# Patient Record
Sex: Male | Born: 1980 | Race: White | Hispanic: No | Marital: Single | Smoking: Current every day smoker
Health system: Southern US, Community
[De-identification: ages and names within clinical notes are randomized; demographics above are authoritative.]

## PROBLEM LIST (undated history)

## (undated) DIAGNOSIS — G629 Polyneuropathy, unspecified: Secondary | ICD-10-CM

## (undated) DIAGNOSIS — I639 Cerebral infarction, unspecified: Secondary | ICD-10-CM

## (undated) DIAGNOSIS — Z789 Other specified health status: Secondary | ICD-10-CM

## (undated) DIAGNOSIS — I10 Essential (primary) hypertension: Secondary | ICD-10-CM

## (undated) DIAGNOSIS — N189 Chronic kidney disease, unspecified: Secondary | ICD-10-CM

## (undated) HISTORY — PX: NO PAST SURGERIES: SHX2092

---

## 2002-01-20 ENCOUNTER — Emergency Department (HOSPITAL_COMMUNITY): Admission: EM | Admit: 2002-01-20 | Discharge: 2002-01-20 | Payer: Self-pay

## 2002-01-24 ENCOUNTER — Emergency Department (HOSPITAL_COMMUNITY): Admission: EM | Admit: 2002-01-24 | Discharge: 2002-01-24 | Payer: Self-pay | Admitting: Emergency Medicine

## 2003-04-28 ENCOUNTER — Emergency Department (HOSPITAL_COMMUNITY): Admission: EM | Admit: 2003-04-28 | Discharge: 2003-04-28 | Payer: Self-pay | Admitting: General Practice

## 2006-01-09 ENCOUNTER — Inpatient Hospital Stay (HOSPITAL_COMMUNITY): Admission: EM | Admit: 2006-01-09 | Discharge: 2006-01-27 | Payer: Self-pay | Admitting: Emergency Medicine

## 2006-01-12 ENCOUNTER — Ambulatory Visit: Payer: Self-pay | Admitting: Cardiology

## 2006-01-13 ENCOUNTER — Ambulatory Visit: Payer: Self-pay | Admitting: Critical Care Medicine

## 2006-01-14 ENCOUNTER — Ambulatory Visit: Payer: Self-pay | Admitting: Cardiology

## 2006-01-14 ENCOUNTER — Encounter: Payer: Self-pay | Admitting: Cardiology

## 2006-01-16 ENCOUNTER — Encounter: Payer: Self-pay | Admitting: Vascular Surgery

## 2006-01-26 ENCOUNTER — Encounter: Payer: Self-pay | Admitting: *Deleted

## 2006-01-27 ENCOUNTER — Ambulatory Visit: Payer: Self-pay | Admitting: Physical Medicine & Rehabilitation

## 2007-03-20 ENCOUNTER — Emergency Department (HOSPITAL_COMMUNITY): Admission: EM | Admit: 2007-03-20 | Discharge: 2007-03-20 | Payer: Self-pay | Admitting: Emergency Medicine

## 2009-04-17 ENCOUNTER — Emergency Department (HOSPITAL_COMMUNITY): Admission: EM | Admit: 2009-04-17 | Discharge: 2009-04-17 | Payer: Self-pay | Admitting: Emergency Medicine

## 2009-04-23 ENCOUNTER — Emergency Department (HOSPITAL_COMMUNITY): Admission: EM | Admit: 2009-04-23 | Discharge: 2009-04-23 | Payer: Self-pay | Admitting: Emergency Medicine

## 2009-08-20 ENCOUNTER — Emergency Department (HOSPITAL_COMMUNITY): Admission: EM | Admit: 2009-08-20 | Discharge: 2009-08-20 | Payer: Self-pay | Admitting: Emergency Medicine

## 2010-06-16 LAB — RAPID STREP SCREEN (MED CTR MEBANE ONLY): Streptococcus, Group A Screen (Direct): NEGATIVE

## 2010-06-17 LAB — URINE MICROSCOPIC-ADD ON

## 2010-06-17 LAB — COMPREHENSIVE METABOLIC PANEL
ALT: 20 U/L (ref 0–53)
Albumin: 4.2 g/dL (ref 3.5–5.2)
Alkaline Phosphatase: 56 U/L (ref 39–117)
Glucose, Bld: 105 mg/dL — ABNORMAL HIGH (ref 70–99)
Potassium: 4 mEq/L (ref 3.5–5.1)
Sodium: 135 mEq/L (ref 135–145)
Total Protein: 7.4 g/dL (ref 6.0–8.3)

## 2010-06-17 LAB — URINALYSIS, ROUTINE W REFLEX MICROSCOPIC
Leukocytes, UA: NEGATIVE
Nitrite: NEGATIVE
Specific Gravity, Urine: >1.03 — ABNORMAL HIGH (ref 1.005–1.030)
Urobilinogen, UA: 0.2 mg/dL (ref 0.0–1.0)

## 2010-06-17 LAB — CBC
Hemoglobin: 16.7 g/dL (ref 13.0–17.0)
Platelets: 193 10*3/uL (ref 150–400)
RDW: 13.9 % (ref 11.5–15.5)

## 2010-06-17 LAB — DIFFERENTIAL
Basophils Relative: 0 % (ref 0–1)
Eosinophils Absolute: 0.9 10*3/uL — ABNORMAL HIGH (ref 0.0–0.7)
Eosinophils Relative: 9 % — ABNORMAL HIGH (ref 0–5)
Monocytes Absolute: 1.1 10*3/uL — ABNORMAL HIGH (ref 0.1–1.0)
Monocytes Relative: 10 % (ref 3–12)

## 2010-08-16 NOTE — Procedures (Signed)
[name] [name], [name] [name]            ACCOUNT NO.:  [id number]   MEDICAL RECORD NO.:  [id number]          PATIENT TYPE:  INP   LOCATION:  IC03                          FACILITY:  [facility]   PHYSICIAN:  Cristopher Estimable. Lattie Haw, MD, FACCDATE OF BIRTH:  [birth year]   DATE OF PROCEDURE:  01/12/2006  DATE OF DISCHARGE:                                  ECHOCARDIOGRAM   PROCEDURE:  Echocardiogram.   REFERRING PHYSICIAN:  Dr. Maryland Pink.   CLINICAL DATA:  A 30 year old with drug overdose and hypotension.   M-mode aorta 3.0, left atrium 3.9, septum 1.3, posterior wall 1.3, LV  diastole 5.1, LV systole 3.1.   1. Technically adequate echocardiographic study.  2. Normal left atrium, right atrium and right ventricle.  3. Normal aortic valve and aortic root.  4. Normal mitral and tricuspid valve; physiologic tricuspid regurgitation.  5. Normal pulmonic valve and proximal pulmonary artery.  6. Normal internal dimension of the left ventricle; mild LVH; normal      regional and global LV systolic function.  7. Normal IVC.  8. Minimal, probably physiologic, pericardial effusion.      Cristopher Estimable. Lattie Haw, MD, [facility]  Electronically Signed     RMR/MEDQ  D:  01/12/2006  T:  01/13/2006  Job:  [id number]
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
## 2010-08-16 NOTE — Discharge Summary (Signed)
NAMEPUJAN, FREELY            ACCOUNT NO.:  [id number]   MEDICAL RECORD NO.:  [id number]          PATIENT TYPE:  INP   LOCATION:  3027                         FACILITY:  [facility]   PHYSICIAN:  Edythe Lynn, M.D.       DATE OF BIRTH:  Oct 23, 1980   DATE OF ADMISSION:  01/13/2006  DATE OF DISCHARGE:  01/27/2006                                 DISCHARGE SUMMARY   DIAGNOSES:  1. Acute respiratory failure secondary to aspiration pneumonitis and adult      respiratory distress syndrome.  2. Methadone overdose.  3. Hypokalemia, resolved.  4. History of alcohol use.  5. Hypokalemia, resolved.  6. Mild rhabdomyolysis resolved.   DISCHARGE MEDICATIONS:  1. Prednisone 20 mg for 2 days and then 10 mg for 3 days then stop.  2. Folic acid 1 mg daily.  3. Vitamin B1 100 mg daily.   CONDITION ON DISCHARGE:  Mr. Knauth was discharged in fair condition at  the time of discharge.  He was set up with home health physical therapy and  occupational therapy.  The patient was also instructed to follow up with a  primary care physician in [place], [place].  Also, the patient's  family was instructed to 24-hour supervision on Mr. Wozny behavior.  There were also instructed to bring him immediately back to the hospital, in  case he displays any bizarre behavior.   PROCEDURES DURING THIS ADMISSION:  1. January 14, 2006, the patient underwent a portable chest x-ray with      findings of right central venous line at [facility] junction and endotracheal      tube 3.8 cm above the carina.  2. January 16, 2006, abdominal ultrasound with findings of gallbladder      sludge, no evidence of cholelithiasis or cholecystitis and normal-      appearing liver.  3. January 26, 2006, computer tomography scan of the head with findings of      no evidence for acute abnormality and no intracranial mass or lesion,      no skull fracture, some sinus disease.  4. January 24, 2006, portable chest x-ray with  findings of mild diffuse      interstitial edema.   CONSULTATIONS DURING THIS ADMISSION:  The patient was seen in consultation  by Dr. Halford Chessman from [place] and in fact, from January 14, 2006 until  January 25, 2006 the patient was under the care of the critical care  medicine service.   HISTORY AND PHYSICAL:  For admission history and physical, refer to the  dictated H&P done by Dr. Hillery Jacks on January 09, 2006.  Also, for the period  of hospitalization at [facility], refer to the previously dictated  discharge summary done by Dr. Maryland Pink January 13, 2006.  Briefly, Mr.  Kittles, a 30 year old man who was admitted on January 09 1006 at [facility], was transferred to [facility] January 13, 2006 for  worsening acute respiratory failure and development of ARDS.  He was  accepted by the Critical Care Service for ARDS treatment.   HOSPITAL COURSE:  1. From  October 17 until January 20, 2006, Mr. Greim was in the      Intensive Care Unit of [facility], where he received      specialized the [place] care.  He was on the ARDS      protocol for his respiratory failure, and he had gradual improvement in      his respiratory status.  On January 22, 2006, the patient was      extubated, and he maintained very good aeration and oxygenation off of      the respiratory support.  The patient was started on a prednisone      treatments, to help with the resolution of his ARDS.  At the time of      discharge, the patient is complaining of prednisone taper.  2. Methadone overdose.  Mr. Maxted received specialist treatment for      his overdose to [facility], and when he was awake and      extubated, he was seen by the Psychiatry Service at [facility] decided that Mr. Espinosa is safe      to be discharged home in the care of family, without any need for      further psychiatric treatment or  observation..  3. Delirium.  Post-extubation, Mr. Sangster displayed some symptoms of      delirium.  He was treated with doses of Seroquel and Klonopin which      were gradually tapered.  By the time of discharge, to Mr. Channer      delirium completely resolved.  There is still a constellation of a      possibility of a occult hypoxic brain injury.  For this reason, the      patient's family has been instructed to keep the patient under close      observation and to bring him back to the hospital, if they notice any      bizarre behavior.      Edythe Lynn, M.D.  Electronically Signed     SL/MEDQ  D:  01/28/2006  T:  01/28/2006  Job:  [id number]

## 2010-10-03 ENCOUNTER — Emergency Department (HOSPITAL_COMMUNITY)
Admission: EM | Admit: 2010-10-03 | Discharge: 2010-10-03 | Disposition: A | Discharge status: Home / Self Care | Payer: Self-pay | Attending: Emergency Medicine | Admitting: Emergency Medicine

## 2010-10-03 DIAGNOSIS — L509 Urticaria, unspecified: Secondary | ICD-10-CM | POA: Insufficient documentation

## 2010-10-03 DIAGNOSIS — F17200 Nicotine dependence, unspecified, uncomplicated: Secondary | ICD-10-CM | POA: Insufficient documentation

## 2016-08-20 ENCOUNTER — Encounter (HOSPITAL_COMMUNITY): Payer: Self-pay

## 2016-08-20 ENCOUNTER — Emergency Department (HOSPITAL_COMMUNITY)
Admission: EM | Admit: 2016-08-20 | Discharge: 2016-08-20 | Disposition: A | Discharge status: Home / Self Care | Payer: Self-pay | Attending: Emergency Medicine | Admitting: Emergency Medicine

## 2016-08-20 ENCOUNTER — Emergency Department (HOSPITAL_COMMUNITY): Payer: Self-pay

## 2016-08-20 DIAGNOSIS — F17200 Nicotine dependence, unspecified, uncomplicated: Secondary | ICD-10-CM | POA: Insufficient documentation

## 2016-08-20 DIAGNOSIS — R51 Headache: Secondary | ICD-10-CM | POA: Insufficient documentation

## 2016-08-20 DIAGNOSIS — R112 Nausea with vomiting, unspecified: Secondary | ICD-10-CM

## 2016-08-20 DIAGNOSIS — R079 Chest pain, unspecified: Secondary | ICD-10-CM

## 2016-08-20 DIAGNOSIS — R197 Diarrhea, unspecified: Secondary | ICD-10-CM | POA: Insufficient documentation

## 2016-08-20 LAB — CBC WITH DIFFERENTIAL/PLATELET
Basophils Absolute: 0 10*3/uL (ref 0.0–0.1)
Basophils Relative: 1 %
Eosinophils Absolute: 0.3 10*3/uL (ref 0.0–0.7)
Eosinophils Relative: 4 %
HEMATOCRIT: 46.3 % (ref 39.0–52.0)
Hemoglobin: 16.8 g/dL (ref 13.0–17.0)
LYMPHS ABS: 3 10*3/uL (ref 0.7–4.0)
LYMPHS PCT: 38 %
MCH: 32.7 pg (ref 26.0–34.0)
MCHC: 36.3 g/dL — ABNORMAL HIGH (ref 30.0–36.0)
MCV: 90.3 fL (ref 78.0–100.0)
MONO ABS: 0.7 10*3/uL (ref 0.1–1.0)
Monocytes Relative: 8 %
NEUTROS ABS: 4 10*3/uL (ref 1.7–7.7)
Neutrophils Relative %: 49 %
Platelets: 180 10*3/uL (ref 150–400)
RBC: 5.13 MIL/uL (ref 4.22–5.81)
RDW: 13.8 % (ref 11.5–15.5)
WBC: 8 10*3/uL (ref 4.0–10.5)

## 2016-08-20 LAB — COMPREHENSIVE METABOLIC PANEL
ALT: 21 U/L (ref 17–63)
ANION GAP: 8 (ref 5–15)
AST: 19 U/L (ref 15–41)
Albumin: 3.9 g/dL (ref 3.5–5.0)
Alkaline Phosphatase: 41 U/L (ref 38–126)
BILIRUBIN TOTAL: 1.1 mg/dL (ref 0.3–1.2)
BUN: 17 mg/dL (ref 6–20)
CHLORIDE: 109 mmol/L (ref 101–111)
CO2: 24 mmol/L (ref 22–32)
Calcium: 8.8 mg/dL — ABNORMAL LOW (ref 8.9–10.3)
Creatinine, Ser: 1.01 mg/dL (ref 0.61–1.24)
GFR calc non Af Amer: >60 mL/min (ref >60)
Glucose, Bld: 101 mg/dL — ABNORMAL HIGH (ref 65–99)
POTASSIUM: 3.3 mmol/L — AB (ref 3.5–5.1)
Sodium: 141 mmol/L (ref 135–145)
TOTAL PROTEIN: 7 g/dL (ref 6.5–8.1)

## 2016-08-20 LAB — TROPONIN I: Troponin I: <0.03 ng/mL (ref <0.03)

## 2016-08-20 LAB — LIPASE, BLOOD: Lipase: 31 U/L (ref 11–51)

## 2016-08-20 MED ORDER — KETOROLAC TROMETHAMINE 30 MG/ML IJ SOLN
15.0000 mg | Freq: Once | INTRAMUSCULAR | Status: AC
  Administered 2016-08-20: 15 mg via INTRAVENOUS
  Filled 2016-08-20: qty 1

## 2016-08-20 MED ORDER — SODIUM CHLORIDE 0.9 % IV BOLUS (SEPSIS)
1000.0000 mL | Freq: Once | INTRAVENOUS | Status: AC
  Administered 2016-08-20: 1000 mL via INTRAVENOUS

## 2016-08-20 MED ORDER — ONDANSETRON HCL 4 MG/2ML IJ SOLN
4.0000 mg | Freq: Once | INTRAMUSCULAR | Status: AC
  Administered 2016-08-20: 4 mg via INTRAVENOUS
  Filled 2016-08-20: qty 2

## 2016-08-20 MED ORDER — FAMOTIDINE 20 MG PO TABS: 20.0000 mg | ORAL_TABLET | Freq: Two times a day (BID) | ORAL | 0 refills | Status: DC

## 2016-08-20 MED ORDER — PROMETHAZINE HCL 25 MG PO TABS: 25.0000 mg | ORAL_TABLET | Freq: Four times a day (QID) | ORAL | 1 refills | Status: DC | PRN

## 2016-08-20 NOTE — ED Notes (Signed)
Pt ambulatory to waiting room. Pt verbalized understanding of discharge instructions.   

## 2016-08-20 NOTE — ED Triage Notes (Signed)
Pt reports centralized chest pain, with radiation to epigastric/luq, bilateral arms. Report migraine and vomiting this morning.
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
## 2016-08-20 NOTE — Discharge Instructions (Addendum)
Take the Pepcid as directed for the next 2 weeks. Take the Phenergan for the next few days as needed for nausea and vomiting. Work note provided. Today's workup without any significant findings. Return for any new or worse symptoms.

## 2017-04-16 ENCOUNTER — Emergency Department (HOSPITAL_COMMUNITY)
Admission: EM | Admit: 2017-04-16 | Discharge: 2017-04-16 | Disposition: A | Discharge status: Other Acute Inpatient Hospital | Payer: Self-pay | Attending: Emergency Medicine | Admitting: Emergency Medicine

## 2017-04-16 ENCOUNTER — Encounter (HOSPITAL_COMMUNITY): Payer: Self-pay | Admitting: Emergency Medicine

## 2017-04-16 DIAGNOSIS — T23032A Burn of unspecified degree of multiple left fingers (nail), not including thumb, initial encounter: Secondary | ICD-10-CM | POA: Insufficient documentation

## 2017-04-16 DIAGNOSIS — X04XXXA Exposure to ignition of highly flammable material, initial encounter: Secondary | ICD-10-CM | POA: Insufficient documentation

## 2017-04-16 DIAGNOSIS — Y939 Activity, unspecified: Secondary | ICD-10-CM | POA: Insufficient documentation

## 2017-04-16 DIAGNOSIS — T310 Burns involving less than 10% of body surface: Secondary | ICD-10-CM | POA: Insufficient documentation

## 2017-04-16 DIAGNOSIS — Y998 Other external cause status: Secondary | ICD-10-CM | POA: Insufficient documentation

## 2017-04-16 DIAGNOSIS — Y929 Unspecified place or not applicable: Secondary | ICD-10-CM | POA: Insufficient documentation

## 2017-04-16 DIAGNOSIS — F17200 Nicotine dependence, unspecified, uncomplicated: Secondary | ICD-10-CM | POA: Insufficient documentation

## 2017-04-16 LAB — CBC WITH DIFFERENTIAL/PLATELET
BASOS ABS: 0 10*3/uL (ref 0.0–0.1)
Basophils Relative: 0 %
Eosinophils Absolute: 0.4 10*3/uL (ref 0.0–0.7)
Eosinophils Relative: 3 %
HEMATOCRIT: 47 % (ref 39.0–52.0)
Hemoglobin: 16.4 g/dL (ref 13.0–17.0)
LYMPHS ABS: 3.8 10*3/uL (ref 0.7–4.0)
Lymphocytes Relative: 26 %
MCH: 31.7 pg (ref 26.0–34.0)
MCHC: 34.9 g/dL (ref 30.0–36.0)
MCV: 90.7 fL (ref 78.0–100.0)
MONOS PCT: 9 %
Monocytes Absolute: 1.3 10*3/uL — ABNORMAL HIGH (ref 0.1–1.0)
NEUTROS PCT: 62 %
Neutro Abs: 9.2 10*3/uL — ABNORMAL HIGH (ref 1.7–7.7)
PLATELETS: 241 10*3/uL (ref 150–400)
RBC: 5.18 MIL/uL (ref 4.22–5.81)
RDW: 14.1 % (ref 11.5–15.5)
WBC: 14.7 10*3/uL — AB (ref 4.0–10.5)

## 2017-04-16 LAB — COMPREHENSIVE METABOLIC PANEL
ALT: 27 U/L (ref 17–63)
AST: 35 U/L (ref 15–41)
Albumin: 4.4 g/dL (ref 3.5–5.0)
Alkaline Phosphatase: 45 U/L (ref 38–126)
Anion gap: 13 (ref 5–15)
BUN: 14 mg/dL (ref 6–20)
CO2: 23 mmol/L (ref 22–32)
Calcium: 9.1 mg/dL (ref 8.9–10.3)
Chloride: 104 mmol/L (ref 101–111)
Creatinine, Ser: 1.37 mg/dL — ABNORMAL HIGH (ref 0.61–1.24)
Glucose, Bld: 98 mg/dL (ref 65–99)
POTASSIUM: 3.9 mmol/L (ref 3.5–5.1)
Sodium: 140 mmol/L (ref 135–145)
Total Bilirubin: 1.2 mg/dL (ref 0.3–1.2)
Total Protein: 7.7 g/dL (ref 6.5–8.1)

## 2017-04-16 MED ORDER — HYDROMORPHONE HCL 1 MG/ML IJ SOLN
1.0000 mg | INTRAMUSCULAR | Status: AC | PRN
  Administered 2017-04-16 (×4): 1 mg via INTRAVENOUS
  Filled 2017-04-16 (×4): qty 1

## 2017-04-16 MED ORDER — LACTATED RINGERS IV SOLN: INTRAVENOUS | Status: DC

## 2017-04-16 MED ORDER — LACTATED RINGERS IV BOLUS (SEPSIS)
500.0000 mL | Freq: Once | INTRAVENOUS | Status: AC
  Administered 2017-04-16: 500 mL via INTRAVENOUS

## 2017-04-16 MED ORDER — ONDANSETRON HCL 4 MG/2ML IJ SOLN
4.0000 mg | Freq: Once | INTRAMUSCULAR | Status: AC
  Administered 2017-04-16: 4 mg via INTRAVENOUS
  Filled 2017-04-16: qty 2

## 2017-04-16 MED ORDER — HYDROMORPHONE HCL 1 MG/ML IJ SOLN
1.0000 mg | INTRAMUSCULAR | Status: DC | PRN
  Administered 2017-04-16 (×3): 1 mg via INTRAVENOUS
  Filled 2017-04-16 (×3): qty 1
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
## 2017-04-16 NOTE — ED Notes (Signed)
Report given to [facility] transport time.

## 2017-04-16 NOTE — ED Notes (Signed)
[place] at [facility] through [place] per Dr. Eulis Foster
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
## 2017-12-27 ENCOUNTER — Emergency Department (HOSPITAL_COMMUNITY)
Admission: EM | Admit: 2017-12-27 | Discharge: 2017-12-27 | Disposition: A | Discharge status: Home / Self Care | Attending: Emergency Medicine | Admitting: Emergency Medicine

## 2017-12-27 ENCOUNTER — Encounter (HOSPITAL_COMMUNITY): Payer: Self-pay | Admitting: Emergency Medicine

## 2017-12-27 DIAGNOSIS — F17210 Nicotine dependence, cigarettes, uncomplicated: Secondary | ICD-10-CM | POA: Diagnosis not present

## 2017-12-27 DIAGNOSIS — M7989 Other specified soft tissue disorders: Secondary | ICD-10-CM | POA: Diagnosis not present

## 2017-12-27 DIAGNOSIS — Z79899 Other long term (current) drug therapy: Secondary | ICD-10-CM | POA: Insufficient documentation

## 2017-12-27 MED ORDER — CEPHALEXIN 500 MG PO CAPS: 1000.0000 mg | ORAL_CAPSULE | Freq: Two times a day (BID) | ORAL | 0 refills | Status: DC

## 2017-12-27 MED ORDER — CEPHALEXIN 500 MG PO CAPS
1000.0000 mg | ORAL_CAPSULE | Freq: Once | ORAL | Status: AC
  Administered 2017-12-27: 1000 mg via ORAL
  Filled 2017-12-27: qty 2
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
## 2018-04-06 ENCOUNTER — Emergency Department (HOSPITAL_COMMUNITY)

## 2018-04-06 ENCOUNTER — Other Ambulatory Visit: Payer: Self-pay

## 2018-04-06 ENCOUNTER — Encounter (HOSPITAL_COMMUNITY): Payer: Self-pay | Admitting: Emergency Medicine

## 2018-04-06 ENCOUNTER — Emergency Department (HOSPITAL_COMMUNITY)
Admission: EM | Admit: 2018-04-06 | Discharge: 2018-04-06 | Disposition: A | Discharge status: Home / Self Care | Attending: Emergency Medicine | Admitting: Emergency Medicine

## 2018-04-06 DIAGNOSIS — Y9389 Activity, other specified: Secondary | ICD-10-CM | POA: Diagnosis not present

## 2018-04-06 DIAGNOSIS — M549 Dorsalgia, unspecified: Secondary | ICD-10-CM | POA: Insufficient documentation

## 2018-04-06 DIAGNOSIS — M25511 Pain in right shoulder: Secondary | ICD-10-CM | POA: Insufficient documentation

## 2018-04-06 DIAGNOSIS — S0993XA Unspecified injury of face, initial encounter: Secondary | ICD-10-CM | POA: Insufficient documentation

## 2018-04-06 DIAGNOSIS — Y999 Unspecified external cause status: Secondary | ICD-10-CM | POA: Insufficient documentation

## 2018-04-06 DIAGNOSIS — F17210 Nicotine dependence, cigarettes, uncomplicated: Secondary | ICD-10-CM | POA: Diagnosis not present

## 2018-04-06 DIAGNOSIS — W19XXXA Unspecified fall, initial encounter: Secondary | ICD-10-CM

## 2018-04-06 DIAGNOSIS — S0990XA Unspecified injury of head, initial encounter: Secondary | ICD-10-CM | POA: Diagnosis present

## 2018-04-06 DIAGNOSIS — R0789 Other chest pain: Secondary | ICD-10-CM | POA: Insufficient documentation

## 2018-04-06 DIAGNOSIS — Y92149 Unspecified place in prison as the place of occurrence of the external cause: Secondary | ICD-10-CM | POA: Insufficient documentation

## 2018-04-06 DIAGNOSIS — R0781 Pleurodynia: Secondary | ICD-10-CM

## 2018-04-06 MED ORDER — OXYCODONE HCL 5 MG PO TABS
5.0000 mg | ORAL_TABLET | Freq: Once | ORAL | Status: AC
  Administered 2018-04-06: 5 mg via ORAL
  Filled 2018-04-06: qty 1

## 2018-04-06 MED ORDER — ACETAMINOPHEN 500 MG PO TABS
1000.0000 mg | ORAL_TABLET | Freq: Once | ORAL | Status: AC
  Administered 2018-04-06: 1000 mg via ORAL
  Filled 2018-04-06: qty 2

## 2018-04-06 MED ORDER — OXYCODONE HCL 5 MG PO TABS: 5.0000 mg | ORAL_TABLET | ORAL | 0 refills | Status: DC | PRN
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
## 2018-04-06 NOTE — ED Notes (Signed)
Pt would like something else for pain before discharge. RN notified.
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
## 2018-04-06 NOTE — ED Notes (Signed)
Pt asking for medication before leaving since he can not have prescription in the jail. EDP will order.
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
## 2021-08-13 ENCOUNTER — Encounter (HOSPITAL_COMMUNITY): Payer: Self-pay | Admitting: Emergency Medicine

## 2021-08-13 ENCOUNTER — Emergency Department (HOSPITAL_COMMUNITY)
Admission: EM | Admit: 2021-08-13 | Discharge: 2021-08-13 | Disposition: A | Discharge status: Home / Self Care | Attending: Emergency Medicine | Admitting: Emergency Medicine

## 2021-08-13 ENCOUNTER — Other Ambulatory Visit: Payer: Self-pay

## 2021-08-13 DIAGNOSIS — L0291 Cutaneous abscess, unspecified: Secondary | ICD-10-CM

## 2021-08-13 DIAGNOSIS — L0231 Cutaneous abscess of buttock: Secondary | ICD-10-CM | POA: Insufficient documentation

## 2021-08-13 MED ORDER — DOXYCYCLINE HYCLATE 100 MG PO CAPS: 100.0000 mg | ORAL_CAPSULE | Freq: Two times a day (BID) | ORAL | 0 refills | Status: DC

## 2021-08-13 MED ORDER — POVIDONE-IODINE 10 % EX SOLN
CUTANEOUS | Status: AC
  Filled 2021-08-13: qty 14.8

## 2021-08-13 MED ORDER — OXYCODONE-ACETAMINOPHEN 5-325 MG PO TABS: 1.0000 | ORAL_TABLET | ORAL | 0 refills | Status: DC | PRN

## 2021-08-13 MED ORDER — HYDROMORPHONE HCL 1 MG/ML IJ SOLN
2.0000 mg | Freq: Once | INTRAMUSCULAR | Status: AC
  Administered 2021-08-13: 2 mg via INTRAMUSCULAR
  Filled 2021-08-13: qty 2

## 2021-08-13 MED ORDER — LIDOCAINE HCL (PF) 1 % IJ SOLN
30.0000 mL | Freq: Once | INTRAMUSCULAR | Status: AC
  Administered 2021-08-13: 30 mL
  Filled 2021-08-13: qty 30

## 2021-08-13 MED ORDER — CEPHALEXIN 500 MG PO CAPS: 500.0000 mg | ORAL_CAPSULE | Freq: Four times a day (QID) | ORAL | 0 refills | Status: DC

## 2021-08-13 NOTE — ED Provider Notes (Signed)
?Edie ?Provider Note ? ? ?CSN: [id number] ?Arrival date & time: 08/13/21  0539 ? ?  ? ?History ? ?Chief Complaint  ?Patient presents with  ? Abscess  ? ? ?Josey Forcier is a 41 y.o. male. ? ?HPI ?Patient presenting for buttocks pain, with a swollen area, no clear etiology.  He works as an Clinical biochemist.  It has been bothering him for several days and he has been manipulating it and using a warm compress on it without relief.  No prior similar problem at the same site.  He has pain in the left groin.  He has noticed this in the last couple of days. ?  ? ?Home Medications ?Prior to Admission medications   ?Medication Sig Start Date End Date Taking? Authorizing Provider  ?cephALEXin (KEFLEX) 500 MG capsule Take 1 capsule (500 mg total) by mouth 4 (four) times daily. 08/13/21  Yes Daleen Bo, MD  ?doxycycline (VIBRAMYCIN) 100 MG capsule Take 1 capsule (100 mg total) by mouth 2 (two) times daily. One po bid x 7 days 08/13/21  Yes Daleen Bo, MD  ?oxyCODONE-acetaminophen (PERCOCET) 5-325 MG tablet Take 1 tablet by mouth every 4 (four) hours as needed for severe pain or moderate pain. 08/13/21  Yes Daleen Bo, MD  ?oxyCODONE (ROXICODONE) 5 MG immediate release tablet Take 1 tablet (5 mg total) by mouth every 4 (four) hours as needed for severe pain. ?Patient not taking: Reported on 08/13/2021 04/06/18   Maudie Flakes, MD  ?   ? ?Allergies    ?Patient has no known allergies.   ? ?Review of Systems   ?Review of Systems ? ?Physical Exam ?Updated Vital Signs ?BP (!) 147/82 (BP Location: Left Arm)   Pulse 93   Temp 97.8 ?F (36.6 ?C) (Oral)   Resp 18   Ht 5\' 7"  (1.702 m)   Wt 93 kg   SpO2 98%   BMI 32.11 kg/m?  ?Physical Exam ?Vitals and nursing note reviewed.  ?Constitutional:   ?   Appearance: He is well-developed. He is not ill-appearing.  ?HENT:  ?   Head: Normocephalic and atraumatic.  ?   Right Ear: External ear normal.  ?   Left Ear: External ear normal.  ?Eyes:  ?    Conjunctiva/sclera: Conjunctivae normal.  ?   Pupils: Pupils are equal, round, and reactive to light.  ?Neck:  ?   Trachea: Phonation normal.  ?Cardiovascular:  ?   Rate and Rhythm: Normal rate.  ?Pulmonary:  ?   Effort: Pulmonary effort is normal.  ?Abdominal:  ?   General: There is no distension.  ?   Tenderness: There is no abdominal tenderness.  ?   Comments: No significant  tenderness, mass, or adenopathy in the left groin  ?Musculoskeletal:     ?   General: Normal range of motion.  ?   Cervical back: Normal range of motion and neck supple.  ?Skin: ?   General: Skin is warm and dry.  ?   Comments: Cutaneous abscess, left lower buttocks, area of induration approximately 5 cm in diameter.  Wound is pointing centrally with pustular eruption.  The wound is mildly fluctuant.  ?Neurological:  ?   Mental Status: He is alert and oriented to person, place, and time.  ?   Cranial Nerves: No cranial nerve deficit.  ?   Sensory: No sensory deficit.  ?   Motor: No abnormal muscle tone.  ?   Coordination: Coordination normal.  ?Psychiatric:     ?  Mood and Affect: Mood normal.     ?   Behavior: Behavior normal.     ?   Thought Content: Thought content normal.     ?   Judgment: Judgment normal.  ? ? ?ED Results / Procedures / Treatments   ?Labs ?(all labs ordered are listed, but only abnormal results are displayed) ?Labs Reviewed - No data to display ? ?EKG ?None ? ?Radiology ?No results found. ? ?Procedures ?Marland Kitchen.Incision and Drainage ? ?Date/Time: 08/13/2021 10:51 AM ?Performed by: Daleen Bo, MD ?Authorized by: Daleen Bo, MD  ? ?Consent:  ?  Consent obtained:  Verbal ?  Risks discussed:  Bleeding, incomplete drainage and pain ?Universal protocol:  ?  Patient identity confirmed:  Verbally with patient ?Location:  ?  Type:  Abscess ?  Size:  5 cm ?  Location: Left buttock. ?Pre-procedure details:  ?  Skin preparation:  Povidone-iodine ?Sedation:  ?  Sedation type:  None ?Anesthesia:  ?  Anesthesia method:  Local  infiltration ?  Local anesthetic:  Lidocaine 1% w/o epi ?Procedure type:  ?  Complexity:  Simple ?Procedure details:  ?  Ultrasound guidance: no   ?  Needle aspiration: yes   ?  Needle size:  18 G ?  Incision types:  Single straight ?  Incision depth:  Subcutaneous ?  Wound management:  Irrigated with saline ?  Drainage:  Purulent (Mild) ?  Wound treatment:  Drain placed ?  Packing materials:  1/4 in iodoform gauze ?  Amount 1/4" iodoform:  3 ?Post-procedure details:  ?  Procedure completion:  Tolerated well, no immediate complications  ? ? ?Medications Ordered in ED ?Medications  ?HYDROmorphone (DILAUDID) injection 2 mg (2 mg Intramuscular Given 08/13/21 0740)  ?lidocaine (PF) (XYLOCAINE) 1 % injection 30 mL (30 mLs Infiltration Given by Other 08/13/21 1033)  ?povidone-iodine (BETADINE) 10 % external solution (  Given 08/13/21 1034)  ? ? ?ED Course/ Medical Decision Making/ A&P ?  ?                        ?Medical Decision Making ?Patient presenting with cutaneous abscess requiring drainage.  No systemic symptoms.  Mild left groin pain is likely secondary to the inflammatory process of the left buttock. ? ?Problems Addressed: ?Abscess: complicated acute illness or injury ?   Details: Worsening abscess, not responding to home treatment with warm compresses and manipulation. ? ?Amount and/or Complexity of Data Reviewed ?Independent Historian:  ?   Details: He is a cogent historian ? ?Risk ?Prescription drug management. ?Decision regarding hospitalization. ?Risk Details: Patient presenting for evaluation of abscess of the left buttocks that has not improved with home treatment.  The abscess was pointing and required drainage.  I&D after treatment with pain medicine, by me.  I was able to remove some purulent material from the wound, and placed packing.  Patient instructed on wound care.  He has significant surrounding signs of infection that appear greater than the size of the pocket, found on I&D.  Therefore he will be  started on oral antibiotic with Keflex and doxycycline.  He is instructed on warm soaking 3 times daily.  He will be given chronic pain medicine.  He is given precautions against driving or working when taking the narcotic.  He is instructed to remove the packing after several days of soaking.  He is instructed to return here if needed for problems.  He does not require hospitalization or surgical intervention at this time. ? ? ? ? ? ? ? ? ? ? ?  Final Clinical Impression(s) / ED Diagnoses ?Final diagnoses:  ?Abscess  ? ? ?Rx / DC Orders ?ED Discharge Orders   ? ?      Ordered  ?  cephALEXin (KEFLEX) 500 MG capsule  4 times daily       ? 08/13/21 1051  ?  doxycycline (VIBRAMYCIN) 100 MG capsule  2 times daily       ? 08/13/21 1051  ?  oxyCODONE-acetaminophen (PERCOCET) 5-325 MG tablet  Every 4 hours PRN       ? 08/13/21 1051  ? ?  ?  ? ?  ? ? ?  ?Daleen Bo, MD ?08/13/21 1821 ? ?

## 2021-08-13 NOTE — ED Triage Notes (Deleted)
Pt c/o abd pain from where feeding tube used to be for a few days. Pt denies any n/v/d. ?

## 2021-08-13 NOTE — Discharge Instructions (Signed)
Soak in a warm tub 3-4 times a day for 30 to 40 minutes.  Try to leave the packing in the wound for several days then remove it by pulling directly out.  Start the antibiotic prescription as soon as possible.  Do not drive or work when taking the narcotic pain reliever.  Return here if not better in a few days or having other problems or concerns. ?

## 2021-08-13 NOTE — ED Triage Notes (Addendum)
Pt c/o abscess to the left buttocks since Friday. Pt states it is as big as softball and has pockets around the area.   ?

## 2021-11-11 ENCOUNTER — Encounter (HOSPITAL_COMMUNITY)
Admission: EM | Disposition: A | Discharge status: Home / Self Care | Payer: Self-pay | Source: Home / Self Care | Attending: Internal Medicine

## 2021-11-11 ENCOUNTER — Other Ambulatory Visit: Payer: Self-pay

## 2021-11-11 ENCOUNTER — Observation Stay (HOSPITAL_COMMUNITY): Payer: Self-pay | Admitting: Certified Registered Nurse Anesthetist

## 2021-11-11 ENCOUNTER — Encounter (HOSPITAL_COMMUNITY): Payer: Self-pay | Admitting: *Deleted

## 2021-11-11 ENCOUNTER — Observation Stay (HOSPITAL_BASED_OUTPATIENT_CLINIC_OR_DEPARTMENT_OTHER): Payer: Self-pay | Admitting: Certified Registered Nurse Anesthetist

## 2021-11-11 ENCOUNTER — Emergency Department (HOSPITAL_COMMUNITY): Payer: Self-pay

## 2021-11-11 ENCOUNTER — Inpatient Hospital Stay (HOSPITAL_COMMUNITY)
Admission: EM | Admit: 2021-11-11 | Discharge: 2021-11-12 | DRG: 513 | Disposition: A | Discharge status: Home / Self Care | Payer: Self-pay | Attending: Family Medicine | Admitting: Family Medicine

## 2021-11-11 DIAGNOSIS — M659 Synovitis and tenosynovitis, unspecified: Secondary | ICD-10-CM | POA: Diagnosis present

## 2021-11-11 DIAGNOSIS — F17210 Nicotine dependence, cigarettes, uncomplicated: Secondary | ICD-10-CM | POA: Diagnosis present

## 2021-11-11 DIAGNOSIS — L02511 Cutaneous abscess of right hand: Secondary | ICD-10-CM | POA: Diagnosis present

## 2021-11-11 DIAGNOSIS — L02519 Cutaneous abscess of unspecified hand: Secondary | ICD-10-CM

## 2021-11-11 DIAGNOSIS — S61001A Unspecified open wound of right thumb without damage to nail, initial encounter: Secondary | ICD-10-CM

## 2021-11-11 DIAGNOSIS — L02512 Cutaneous abscess of left hand: Secondary | ICD-10-CM

## 2021-11-11 DIAGNOSIS — M009 Pyogenic arthritis, unspecified: Secondary | ICD-10-CM

## 2021-11-11 DIAGNOSIS — M65842 Other synovitis and tenosynovitis, left hand: Secondary | ICD-10-CM

## 2021-11-11 DIAGNOSIS — Z8249 Family history of ischemic heart disease and other diseases of the circulatory system: Secondary | ICD-10-CM

## 2021-11-11 DIAGNOSIS — L03011 Cellulitis of right finger: Secondary | ICD-10-CM | POA: Diagnosis present

## 2021-11-11 DIAGNOSIS — N179 Acute kidney failure, unspecified: Secondary | ICD-10-CM

## 2021-11-11 HISTORY — PX: I & D EXTREMITY: SHX5045

## 2021-11-11 HISTORY — DX: Unspecified open wound of right thumb without damage to nail, initial encounter: S61.001A

## 2021-11-11 HISTORY — DX: Other specified health status: Z78.9

## 2021-11-11 SURGERY — IRRIGATION AND DEBRIDEMENT EXTREMITY
Anesthesia: General | Laterality: Right

## 2021-11-11 SURGERY — IRRIGATION AND DEBRIDEMENT EXTREMITY
Anesthesia: Choice | Laterality: Right

## 2021-11-11 MED ORDER — METOCLOPRAMIDE HCL 5 MG/ML IJ SOLN: 5.0000 mg | Freq: Three times a day (TID) | INTRAMUSCULAR | Status: DC | PRN

## 2021-11-11 MED ORDER — LIDOCAINE HCL (CARDIAC) PF 100 MG/5ML IV SOSY
PREFILLED_SYRINGE | INTRAVENOUS | Status: DC | PRN
  Administered 2021-11-11: 60 mg via INTRAVENOUS

## 2021-11-11 MED ORDER — OXYCODONE HCL 5 MG PO TABS
5.0000 mg | ORAL_TABLET | Freq: Once | ORAL | Status: AC | PRN
  Administered 2021-11-11: 5 mg via ORAL

## 2021-11-11 MED ORDER — 0.9 % SODIUM CHLORIDE (POUR BTL) OPTIME
TOPICAL | Status: DC | PRN
  Administered 2021-11-11: 1000 mL
  Administered 2021-11-11: 3000 mL

## 2021-11-11 MED ORDER — ONDANSETRON HCL 4 MG PO TABS: 4.0000 mg | ORAL_TABLET | Freq: Four times a day (QID) | ORAL | Status: DC | PRN

## 2021-11-11 MED ORDER — ACETAMINOPHEN 10 MG/ML IV SOLN
INTRAVENOUS | Status: AC
  Filled 2021-11-11: qty 100

## 2021-11-11 MED ORDER — ONDANSETRON HCL 4 MG/2ML IJ SOLN: 4.0000 mg | Freq: Four times a day (QID) | INTRAMUSCULAR | Status: DC | PRN

## 2021-11-11 MED ORDER — ACETAMINOPHEN 650 MG RE SUPP: 650.0000 mg | Freq: Four times a day (QID) | RECTAL | Status: DC | PRN

## 2021-11-11 MED ORDER — DEXAMETHASONE SODIUM PHOSPHATE 10 MG/ML IJ SOLN
INTRAMUSCULAR | Status: DC | PRN
  Administered 2021-11-11: 8 mg via INTRAVENOUS

## 2021-11-11 MED ORDER — CHLORHEXIDINE GLUCONATE 4 % EX LIQD: 60.0000 mL | Freq: Once | CUTANEOUS | Status: DC

## 2021-11-11 MED ORDER — ONDANSETRON HCL 4 MG/2ML IJ SOLN
INTRAMUSCULAR | Status: DC | PRN
  Administered 2021-11-11: 4 mg via INTRAVENOUS

## 2021-11-11 MED ORDER — ACETAMINOPHEN 10 MG/ML IV SOLN
INTRAVENOUS | Status: DC | PRN
  Administered 2021-11-11: 1000 mg via INTRAVENOUS

## 2021-11-11 MED ORDER — HYDROMORPHONE HCL 1 MG/ML IJ SOLN
1.0000 mg | Freq: Once | INTRAMUSCULAR | Status: AC
  Administered 2021-11-11: 1 mg via INTRAVENOUS
  Filled 2021-11-11: qty 1

## 2021-11-11 MED ORDER — HYDROMORPHONE HCL 1 MG/ML IJ SOLN
1.0000 mg | INTRAMUSCULAR | Status: DC | PRN
  Administered 2021-11-11 – 2021-11-12 (×4): 1 mg via INTRAVENOUS
  Filled 2021-11-11 (×6): qty 1

## 2021-11-11 MED ORDER — KETOROLAC TROMETHAMINE 30 MG/ML IJ SOLN
30.0000 mg | Freq: Once | INTRAMUSCULAR | Status: AC
  Administered 2021-11-11: 30 mg via INTRAVENOUS
  Filled 2021-11-11: qty 1

## 2021-11-11 MED ORDER — MIDAZOLAM HCL 2 MG/2ML IJ SOLN
INTRAMUSCULAR | Status: DC | PRN
  Administered 2021-11-11: 2 mg via INTRAVENOUS

## 2021-11-11 MED ORDER — FENTANYL CITRATE PF 50 MCG/ML IJ SOSY
PREFILLED_SYRINGE | INTRAMUSCULAR | Status: AC
  Filled 2021-11-11: qty 1

## 2021-11-11 MED ORDER — ACETAMINOPHEN 10 MG/ML IV SOLN: 1000.0000 mg | Freq: Once | INTRAVENOUS | Status: DC | PRN

## 2021-11-11 MED ORDER — SODIUM CHLORIDE 0.9 % IV SOLN
2.0000 g | Freq: Three times a day (TID) | INTRAVENOUS | Status: DC
  Administered 2021-11-11 – 2021-11-12 (×3): 2 g via INTRAVENOUS
  Filled 2021-11-11 (×3): qty 12.5

## 2021-11-11 MED ORDER — VANCOMYCIN HCL 750 MG/150ML IV SOLN
750.0000 mg | Freq: Two times a day (BID) | INTRAVENOUS | Status: DC
  Administered 2021-11-11: 750 mg via INTRAVENOUS
  Filled 2021-11-11: qty 150

## 2021-11-11 MED ORDER — FENTANYL CITRATE PF 50 MCG/ML IJ SOSY
25.0000 ug | PREFILLED_SYRINGE | INTRAMUSCULAR | Status: DC | PRN
  Administered 2021-11-11 (×2): 25 ug via INTRAVENOUS
  Administered 2021-11-11: 50 ug via INTRAVENOUS
  Administered 2021-11-11 (×2): 25 ug via INTRAVENOUS

## 2021-11-11 MED ORDER — FENTANYL CITRATE (PF) 100 MCG/2ML IJ SOLN
INTRAMUSCULAR | Status: AC
  Filled 2021-11-11: qty 2

## 2021-11-11 MED ORDER — OXYCODONE HCL 5 MG PO TABS
ORAL_TABLET | ORAL | Status: AC
  Filled 2021-11-11: qty 1

## 2021-11-11 MED ORDER — LACTATED RINGERS IV SOLN: INTRAVENOUS | Status: DC | PRN

## 2021-11-11 MED ORDER — DOCUSATE SODIUM 100 MG PO CAPS
100.0000 mg | ORAL_CAPSULE | Freq: Two times a day (BID) | ORAL | Status: DC
  Administered 2021-11-11 – 2021-11-12 (×2): 100 mg via ORAL
  Filled 2021-11-11 (×3): qty 1

## 2021-11-11 MED ORDER — CEFAZOLIN SODIUM-DEXTROSE 2-4 GM/100ML-% IV SOLN
2.0000 g | INTRAVENOUS | Status: AC
  Administered 2021-11-11: 2 g via INTRAVENOUS
  Filled 2021-11-11: qty 100

## 2021-11-11 MED ORDER — ACETAMINOPHEN 325 MG PO TABS
650.0000 mg | ORAL_TABLET | Freq: Four times a day (QID) | ORAL | Status: DC | PRN
  Administered 2021-11-12 (×2): 650 mg via ORAL
  Filled 2021-11-11 (×3): qty 2

## 2021-11-11 MED ORDER — POVIDONE-IODINE 10 % EX SWAB
2.0000 | Freq: Once | CUTANEOUS | Status: AC
  Administered 2021-11-11: 2 via TOPICAL

## 2021-11-11 MED ORDER — MIDAZOLAM HCL 2 MG/2ML IJ SOLN
INTRAMUSCULAR | Status: AC
  Filled 2021-11-11: qty 2

## 2021-11-11 MED ORDER — LIDOCAINE 2% (20 MG/ML) 5 ML SYRINGE
INTRAMUSCULAR | Status: AC
Start: 2021-11-11 — End: ?
  Filled 2021-11-11: qty 5

## 2021-11-11 MED ORDER — HYDROMORPHONE HCL 1 MG/ML IJ SOLN
INTRAMUSCULAR | Status: DC | PRN
  Administered 2021-11-11: 1 mg via INTRAVENOUS

## 2021-11-11 MED ORDER — HYDROMORPHONE HCL 1 MG/ML IJ SOLN
0.2500 mg | INTRAMUSCULAR | Status: DC | PRN
  Administered 2021-11-11: 0.25 mg via INTRAVENOUS

## 2021-11-11 MED ORDER — ACETAMINOPHEN 500 MG PO TABS: 1000.0000 mg | ORAL_TABLET | Freq: Once | ORAL | Status: DC | PRN

## 2021-11-11 MED ORDER — OXYCODONE HCL 5 MG/5ML PO SOLN: 5.0000 mg | Freq: Once | ORAL | Status: AC | PRN

## 2021-11-11 MED ORDER — PROPOFOL 10 MG/ML IV BOLUS
INTRAVENOUS | Status: AC
  Filled 2021-11-11: qty 20

## 2021-11-11 MED ORDER — FENTANYL CITRATE (PF) 100 MCG/2ML IJ SOLN
INTRAMUSCULAR | Status: DC | PRN
  Administered 2021-11-11: 50 ug via INTRAVENOUS
  Administered 2021-11-11: 100 ug via INTRAVENOUS
  Administered 2021-11-11: 50 ug via INTRAVENOUS

## 2021-11-11 MED ORDER — METOCLOPRAMIDE HCL 5 MG PO TABS: 5.0000 mg | ORAL_TABLET | Freq: Three times a day (TID) | ORAL | Status: DC | PRN

## 2021-11-11 MED ORDER — ACETAMINOPHEN 160 MG/5ML PO SOLN: 1000.0000 mg | Freq: Once | ORAL | Status: DC | PRN

## 2021-11-11 MED ORDER — PROPOFOL 10 MG/ML IV BOLUS
INTRAVENOUS | Status: DC | PRN
  Administered 2021-11-11: 200 mg via INTRAVENOUS

## 2021-11-11 MED ORDER — HYDROMORPHONE HCL 1 MG/ML IJ SOLN
INTRAMUSCULAR | Status: AC
  Filled 2021-11-11: qty 1

## 2021-11-11 MED ORDER — SODIUM CHLORIDE 0.9 % IV SOLN: INTRAVENOUS | Status: DC | PRN

## 2021-11-11 SURGICAL SUPPLY — 36 items
2INCH KLING ×1 IMPLANT
BNDG CMPR MED 10X6 ELC LF (GAUZE/BANDAGES/DRESSINGS)
BNDG ELASTIC 4X5.8 VLCR STR LF (GAUZE/BANDAGES/DRESSINGS) ×1 IMPLANT
BNDG ELASTIC 6X10 VLCR STRL LF (GAUZE/BANDAGES/DRESSINGS) IMPLANT
CANISTER WOUND CARE 500ML ATS (WOUND CARE) IMPLANT
CUFF TOURN SGL QUICK 34 (TOURNIQUET CUFF) ×2
CUFF TOURN SGL QUICK 42 (TOURNIQUET CUFF) ×2 IMPLANT
CUFF TRNQT CYL 34X4X40X1 (TOURNIQUET CUFF) IMPLANT
DRAIN CHANNEL 19F RND (DRAIN) IMPLANT
DRAIN PENROSE 0.25X18 (DRAIN) ×1 IMPLANT
DRAPE U-SHAPE 47X51 STRL (DRAPES) ×2 IMPLANT
DRSG ADAPTIC 3X8 NADH LF (GAUZE/BANDAGES/DRESSINGS) IMPLANT
DRSG VAC ATS LRG SENSATRAC (GAUZE/BANDAGES/DRESSINGS) IMPLANT
DRSG VAC ATS MED SENSATRAC (GAUZE/BANDAGES/DRESSINGS) IMPLANT
DRSG VAC ATS SM SENSATRAC (GAUZE/BANDAGES/DRESSINGS) IMPLANT
DURAPREP 26ML APPLICATOR (WOUND CARE) ×2 IMPLANT
GAUZE SPONGE 4X4 12PLY STRL (GAUZE/BANDAGES/DRESSINGS) ×1 IMPLANT
GAUZE XEROFORM 1X8 LF (GAUZE/BANDAGES/DRESSINGS) ×1 IMPLANT
GLOVE BIO SURGEON STRL SZ7.5 (GLOVE) ×2 IMPLANT
GLOVE BIOGEL PI IND STRL 8 (GLOVE) ×1 IMPLANT
GLOVE BIOGEL PI INDICATOR 8 (GLOVE) ×1
MANIFOLD NEPTUNE II (INSTRUMENTS) ×1 IMPLANT
PACK ORTHO EXTREMITY (CUSTOM PROCEDURE TRAY) ×2 IMPLANT
PAD CAST 4YDX4 CTTN HI CHSV (CAST SUPPLIES) IMPLANT
PADDING CAST COTTON 4X4 STRL (CAST SUPPLIES)
PADDING CAST COTTON 6X4 STRL (CAST SUPPLIES) IMPLANT
SET IRRIG Y TYPE TUR BLADDER L (SET/KITS/TRAYS/PACK) ×1 IMPLANT
STOCKINETTE 8 INCH (MISCELLANEOUS) ×2 IMPLANT
SUT ETHILON 2 0 PS N (SUTURE) IMPLANT
SUT ETHILON 3 0 PS 1 (SUTURE) ×1 IMPLANT
SUT PDS AB 2-0 CT2 27 (SUTURE) ×2 IMPLANT
SWAB COLLECTION DEVICE MRSA (MISCELLANEOUS) ×1 IMPLANT
SWAB CULTURE ESWAB REG 1ML (MISCELLANEOUS) ×1 IMPLANT
TOWEL OR 17X26 10 PK STRL BLUE (TOWEL DISPOSABLE) ×2 IMPLANT
UNDERPAD 30X36 HEAVY ABSORB (UNDERPADS AND DIAPERS) ×2 IMPLANT
YANKAUER SUCT BULB TIP 10FT TU (MISCELLANEOUS) ×1 IMPLANT

## 2021-11-11 NOTE — Consult Note (Signed)
Reason for Consult: Right hand abscess Referring Physician: [facility]  [name] [name] is an 41 y.o. male.  HPI: Patient is a 41 year old male who sustained an open thumb dislocation about 2 weeks ago.  He was seen in ED where closed reduction was performed and the wound was closed.  2 weeks later he developed pain and swelling that was extending up his arm.  He presented to the emergency department and was transferred for definitive care.  On orthopedic evaluation patient complains of pain and swelling in his hand mostly about the thumb and palm.  He states his dog jumped on him and this caused increasing pain and he is concerned that that set off the infection.  Patient was placed in a splint and eating initially but was not wearing it.  Past Medical History:  Diagnosis Date   Medical history non-contributory     Past Surgical History:  Procedure Laterality Date   NO PAST SURGERIES      Family History  Problem Relation Age of Onset   Hypertension Other     Social History:  reports that he has been smoking cigarettes. He has been smoking an average of .5 packs per day. He has never used smokeless tobacco. He reports current alcohol use. He reports that he does not currently use drugs after having used the following drugs: Cocaine and Marijuana.  Allergies: No Known Allergies  Medications: I have reviewed the patient's current medications.  No results found for this or any previous visit (from the past 48 hour(s)).  DG Hand Complete Right  Result Date: 11/11/2021 CLINICAL DATA:  Infection.  Hand injury. EXAM: RIGHT HAND - COMPLETE 3+ VIEW COMPARISON:  Right hand x-ray 08/20/2009 FINDINGS: There is no evidence of fracture or dislocation. There is no evidence of arthropathy or other focal bone abnormality. There is diffuse soft tissue swelling of the hand. There is a 2 x 1 mm foreign body in the soft tissues of the distal second finger, unchanged from 2011. No new  foreign bodies are seen. No soft tissue gas identified. IMPRESSION: 1. Diffuse soft tissue swelling of the hand. 2. No acute bony abnormality. 3. Small foreign body in the second finger is unchanged from 2011. Electronically Signed   By: Ronney Asters M.D.   On: 11/11/2021 15:22    Review of Systems  Constitutional: Negative.   HENT: Negative.    Eyes: Negative.   Respiratory: Negative.    Musculoskeletal:        Right hand and thumb pain   Blood pressure 127/64, pulse 82, temperature 98.1 F (36.7 C), temperature source Oral, resp. rate 16, height 5\' 7"  (1.702 m), weight 99.8 kg, SpO2 98 %. Physical Exam HENT:     Head: Normocephalic.     Mouth/Throat:     Mouth: Mucous membranes are dry.  Eyes:     Extraocular Movements: Extraocular movements intact.  Cardiovascular:     Rate and Rhythm: Normal rate and regular rhythm.     Pulses: Normal pulses.  Pulmonary:     Effort: Pulmonary effort is normal.  Abdominal:     General: Abdomen is flat.  Musculoskeletal:     Cervical back: Neck supple.     Comments: Evaluation of the right hand demonstrates swelling about the thumb.  There is some evidence of laceration at the base of the thumb near the level of the IP joint.  There is a pocket of purulence there.  He has some tenderness to palpation  about the thenar eminence and dorsally about the thumb IP joint.  No obvious streaking erythema up the forearm.  Endorses sensation to light touch in the median nerve distribution.  He has pain with range of motion of the thumb.  No evidence of other digital injury or swelling  Skin:    General: Skin is warm.     Findings: Erythema present.  Neurological:     Mental Status: He is alert.  Psychiatric:        Mood and Affect: Mood normal.     Assessment/Plan: Patient has likely an abscess about his previous open thumb dislocation.  There is evidence of purulence and some redness in this area.  Plan will be for I&D of his thumb abscess.  He may  have septic arthritis due to the location of the presumed abscess and the interphalangeal joint.  This is also likely due to open joint dislocation previously.  He may require incision proximally along the thenar eminence based on findings intraoperatively.  We will likely place a Penrose drain and he will be admitted to medicine overnight for observation and antibiotic treatment.  We will likely leave the drain in place until Friday.  We discussed the risk, benefits and alternatives of surgery which include but are not limited to continued infection, need for further surgery, damage to surrounding structures and bleeding risk.  He understands the risk and would like to proceed.    Erle Crocker 11/11/2021, 7:09 PM

## 2021-11-11 NOTE — Transfer of Care (Signed)
Immediate Anesthesia Transfer of Care Note  Patient: [name] [name]  Procedure(s) Performed: IRRIGATION AND DEBRIDEMENT EXTREMITY (Right)  Patient Location: PACU  Anesthesia Type:General  Level of Consciousness: drowsy  Airway & Oxygen Therapy: Patient Spontanous Breathing and Patient connected to face mask oxygen  Post-op Assessment: Report given to RN and Post -op Vital signs reviewed and stable  Post vital signs: Reviewed and stable  Last Vitals:  Vitals Value Taken Time  BP 128/82 11/11/21 1907  Temp    Pulse 80 11/11/21 1908  Resp 12 11/11/21 1908  SpO2 100 % 11/11/21 1908  Vitals shown include unvalidated device data.  Last Pain:  Vitals:   11/11/21 1744  TempSrc: Oral  PainSc: 8          Complications: No notable events documented.

## 2021-11-11 NOTE — ED Triage Notes (Signed)
Patient BIB [facility] from [facility]. Patient experienced a hand injury about one week ago and sutures were placed. Patient woke up last night and found hand to be swollen around the suture site and along pads of fingers. Patient here to see hand surgeon. 4mg  of morphine given to patient before transportation.  Last BP was: 140/90

## 2021-11-11 NOTE — H&P (Signed)
History and Physical    Patient: [name] [name] EHO:[id number] DOB: 1980-09-20 DOA: 11/11/2021 DOS: the patient was seen and examined on 11/11/2021 PCP: Patient, No Pcp Per  Patient coming from: Home  Chief Complaint:  Chief Complaint  Patient presents with   Wound Infection   HPI: [name] [name] is a 41 y.o. male with no previous past medical history who is coming to the emergency department from [facility] ED who self for an open thumb dislocation on Friday.  He went to the emergency department where the dislocation was reduced and the wound was sutured.  However, 2 nights ago his mastiff dog jumped on him and he instinctively caught the canine with his right hand with substantial worsening of the injury.  He has been having edema, erythema, worsening pain, calor and streaking erythema in his forearm.  He will also has been having fever and chills.  He went to [facility] where he received vancomycin, cefepime, analgesics and was transferred to [facility] for Copy consultation. He denied rhinorrhea, sore throat, wheezing or hemoptysis.  No chest pain, palpitations, diaphoresis, PND, orthopnea or pitting edema of the lower extremities.  No abdominal pain, nausea, emesis, diarrhea, constipation, melena or hematochezia.  No flank pain, dysuria, frequency or hematuria.  No polyuria, polydipsia, polyphagia or blurred vision.   ED course: Initial vital signs were temperature [facility] ED course: Initial vital signs were 98.3 F, pulse 83, respiration 18, BP 141/119 mmHg O2 sat 98% on room air.  The patient received hydromorphone 1 mg IVP in the ED.  Hand surgery was contacted who is planning to take the patient to the OR later this evening.  Lab work at [facility] ED: His CBC showed a white count of 14.4, hemoglobin 14.8 and platelets 152.  His lactic acid was normal.  Set rate was 7 mm/h.  His CMP was unremarkable.  Imaging: Right hand x-ray showed diffuse soft tissue  swelling of the hand without any acute osseous abnormality.  There is a small foreign body in the second finger that is unchanged from 2011.    Review of Systems: As mentioned in the history of present illness. All other systems reviewed and are negative. Past Medical History:  Diagnosis Date   Medical history non-contributory    Past Surgical History:  Procedure Laterality Date   NO PAST SURGERIES     Social History:  reports that he has been smoking cigarettes. He has been smoking an average of .5 packs per day. He has never used smokeless tobacco. He reports current alcohol use. He reports that he does not currently use drugs after having used the following drugs: Cocaine and Marijuana.  No Known Allergies  Family History  Problem Relation Age of Onset   Hypertension Other     Prior to Admission medications   Medication Sig Start Date End Date Taking? Authorizing Provider  cephALEXin (KEFLEX) 500 MG capsule Take 1 capsule (500 mg total) by mouth 4 (four) times daily. Patient not taking: Reported on 11/11/2021 08/13/21   Daleen Bo, MD  doxycycline (VIBRAMYCIN) 100 MG capsule Take 1 capsule (100 mg total) by mouth 2 (two) times daily. One po bid x 7 days Patient not taking: Reported on 11/11/2021 08/13/21   Daleen Bo, MD  HYDROcodone-acetaminophen (NORCO/VICODIN) 5-325 MG tablet Take 1 tablet by mouth every 4 (four) hours as needed. Patient not taking: Reported on 11/11/2021 11/08/21   [provider]  naproxen (NAPROSYN) 500 MG tablet Take 500 mg by  mouth 2 (two) times daily. Patient not taking: Reported on 11/11/2021 11/08/21   [provider]  oxyCODONE (ROXICODONE) 5 MG immediate release tablet Take 1 tablet (5 mg total) by mouth every 4 (four) hours as needed for severe pain. Patient not taking: Reported on 08/13/2021 04/06/18   Maudie Flakes, MD  oxyCODONE-acetaminophen (PERCOCET) 5-325 MG tablet Take 1 tablet by mouth every 4 (four) hours as needed for  severe pain or moderate pain. Patient not taking: Reported on 11/11/2021 08/13/21   Daleen Bo, MD    Physical Exam: Vitals:   11/11/21 1345 11/11/21 1346 11/11/21 1415 11/11/21 1515  BP: (!) 178/137  (!) 148/96 (!) 141/97  Pulse: 92  81 87  Resp: 18 18 16 18   Temp: 99.7 F (37.6 C)     TempSrc: Oral     SpO2: 100%  100% 99%  Weight:      Height:       Physical Exam Vitals and nursing note reviewed.  Constitutional:      Appearance: Normal appearance. He is not ill-appearing.  HENT:     Head: Normocephalic.     Nose: No rhinorrhea.     Mouth/Throat:     Mouth: Mucous membranes are dry.  Eyes:     General: No scleral icterus.    Pupils: Pupils are equal, round, and reactive to light.  Neck:     Vascular: No JVD.  Cardiovascular:     Rate and Rhythm: Normal rate and regular rhythm.     Pulses: Normal pulses.     Heart sounds: S1 normal and S2 normal.  Pulmonary:     Effort: Pulmonary effort is normal.     Breath sounds: Normal breath sounds.  Abdominal:     General: Bowel sounds are normal. There is no distension.     Palpations: Abdomen is soft.     Tenderness: There is no abdominal tenderness.  Musculoskeletal:     Right hand: Swelling, laceration and tenderness present. Decreased range of motion.     Cervical back: Neck supple.     Right lower leg: No edema.     Left lower leg: No edema.     Comments: See pictures below.  Skin:    General: Skin is warm and dry.  Neurological:     General: No focal deficit present.     Mental Status: He is alert.  Psychiatric:        Mood and Affect: Mood normal.        Behavior: Behavior normal. Behavior is cooperative.        Data Reviewed:  There are no new results to review at this time.  Assessment and Plan: Principal Problem:   Cellulitis of finger of right hand MedSurg observation. Continue IV fluids. Analgesics as needed. Toradol 30 mg IVP x1. Continue cefepime 2 g every 8 hours.   Continue  vancomycin per pharmacy. Follow-up blood culture and sensitivity Follow CBC and CMP in a.m. Hand surgery will be following at [place] Planning:   Code Status: Full Code   Consults: Hand surgery.   Family Communication:   Severity of Illness: The appropriate patient status for this patient is OBSERVATION. Observation status is judged to be reasonable and necessary in order to provide the required intensity of service to ensure the patient's safety. The patient's presenting symptoms, physical exam findings, and initial radiographic and laboratory data in the context of their medical condition is felt to  place them at decreased risk for further clinical deterioration. Furthermore, it is anticipated that the patient will be medically stable for discharge from the hospital within 2 midnights of admission.   Author: Reubin Milan, MD 11/11/2021 4:17 PM  For on call review [email or web address].   This document was prepared using Dragon voice recognition software and may contain some unintended transcription errors.

## 2021-11-11 NOTE — Anesthesia Procedure Notes (Signed)
Procedure Name: LMA Insertion Date/Time: 11/11/2021 6:31 PM  Performed by: Raenette Rover, CRNAPre-anesthesia Checklist: Patient identified, Emergency Drugs available, Suction available and Patient being monitored Patient Re-evaluated:Patient Re-evaluated prior to induction Oxygen Delivery Method: Circle system utilized Preoxygenation: Pre-oxygenation with 100% oxygen Induction Type: IV induction Ventilation: Mask ventilation without difficulty LMA: LMA inserted LMA Size: 4.0 Tube type: Oral Number of attempts: 1 Placement Confirmation: positive ETCO2 and breath sounds checked- equal and bilateral Tube secured with: Tape Dental Injury: Teeth and Oropharynx as per pre-operative assessment

## 2021-11-11 NOTE — ED Notes (Signed)
Pt will go to [facility] by 6pm. [facility] has been updated and will transport pt.

## 2021-11-11 NOTE — Anesthesia Preprocedure Evaluation (Signed)
Anesthesia Evaluation  Patient identified by MRN, date of birth, ID band Patient awake    Reviewed: Allergy & Precautions, NPO status , Patient's Chart, lab work & pertinent test results  History of Anesthesia Complications Negative for: history of anesthetic complications  Airway Mallampati: II  TM Distance: >3 FB Neck ROM: Full    Dental  (+) Teeth Intact, Dental Advisory Given   Pulmonary neg shortness of breath, neg sleep apnea, neg COPD, neg recent URI, Current Smoker and Patient abstained from smoking.,    breath sounds clear to auscultation       Cardiovascular negative cardio ROS   Rhythm:Regular     Neuro/Psych negative neurological ROS  negative psych ROS   GI/Hepatic negative GI ROS, Neg liver ROS,   Endo/Other  negative endocrine ROS  Renal/GU ARFRenal diseaseLab Results      Component                Value               Date                      CREATININE               1.37 (H)            04/16/2017                Musculoskeletal RIGHT THUMB INFECTION   Abdominal   Peds  Hematology  (+) Blood dyscrasia, , Lab Results      Component                Value               Date                      WBC                      14.7 (H)            04/16/2017                HGB                      16.4                04/16/2017                HCT                      47.0                04/16/2017                MCV                      90.7                04/16/2017                PLT                      241                 04/16/2017              Anesthesia Other Findings   Reproductive/Obstetrics  Anesthesia Physical Anesthesia Plan  ASA: 2  Anesthesia Plan: General   Post-op Pain Management: Ofirmev IV (intra-op)*   Induction: Intravenous  PONV Risk Score and Plan: 1 and Ondansetron and Dexamethasone  Airway Management Planned:  LMA  Additional Equipment: None  Intra-op Plan:   Post-operative Plan: Extubation in OR  Informed Consent: I have reviewed the patients History and Physical, chart, labs and discussed the procedure including the risks, benefits and alternatives for the proposed anesthesia with the patient or authorized representative who has indicated his/her understanding and acceptance.     Dental advisory given  Plan Discussed with: CRNA  Anesthesia Plan Comments:         Anesthesia Quick Evaluation

## 2021-11-11 NOTE — Op Note (Addendum)
[name] [name] male 41 y.o. 11/11/2021  PreOperative Diagnosis: Right thumb abscess  PostOperative Diagnosis: Right thumb interphalangeal joint septic arthritis Flexor tenosynovitis Thenar abscess  PROCEDURE: Arthrotomy of right thumb interphalangeal joint for septic arthritis  Left thumb flexor tendon sheath incision for flexor tenosynovitis Incision and drainage of thumb thenar abscess multiple bursal cavities  SURGEON: Melony Overly, MD  ASSISTANT: Jesse Martinique, PA-C  ANESTHESIA: Genera LMA  FINDINGS: Confluent abscess with the thumb interphalangeal joint with flexor tenosynovitis and thenar abscess  IMPLANTS: None  INDICATIONS:40 y.o. male sustained an open thumb interphalangeal joint dislocation that underwent closed reduction and closure of the wound in [place] at the emergency department.  This occurred 2 weeks ago.  Patient had sudden onset of pain within the left thumb after his dog jumped on him.  He was not wearing the splint that was provided at the previous emergency department.   Patient understood the risks, benefits and alternatives to surgery which include but are not limited to wound healing complications, infection, nonunion, malunion, need for further surgery as well as damage to surrounding structures. They also understood the potential for continued pain in that there were no guarantees of acceptable outcome After weighing these risks the patient opted to proceed with surgery.  PROCEDURE: Patient was identified in the preoperative holding area.  The right hand was marked by myself.  Consent was signed by myself and the patient.  Patient was taken to the operative suite and placed supine on the operative table.  Hand table was used.  Right upper extremity was prepped and draped in usual sterile fashion after LMA anesthesia was induced without difficulty.  Preoperative antibiotics were given.  Surgical timeout performed.  We began by inspecting the thumb.   The sutures that were previously placed were removed.  The wound had some healing.  Using a 15 blade the wound was opened up sharply.  Once the wound opened up there was a rush of purulent material. Proceeded to open up the palmar aspect of the thumb interphalangeal joint.  Upon opening the joint there was a small amount of purulent material.  This was evacuated.  Normal saline was used to irrigate the joint.  Some culture material was obtained from the interphalangeal joint. Some of this was obtained via swab for culture.    Then we extended the incision proximally.  The wound was opened up and inspected.  There was evidence of purulence along the flexor tendon sheath.  Then an incision was made within the flexor tendon sheath of the flexor pollicis longus tendon.  There was a small amount of milky fluid but no gross purulence. The purulence was evacuated.    We then proceeded with opening up the area of the palmar abscess.  The skin incision was made overlying the distal aspect of the are eminence.  The skin and subcutaneous tissue was opened up.  Then using dissection scissors we then followed the abscess cavity proximally a small way into the thenar eminence. Abscess cavity was identified.  There were multiple bursal sacs involved and using dissection scissors the distal cavities were opened and deloculated.  The small amount of purulent material was expressed and culture material was obtained. Then we proceeded with irrigation.  1000 cc of normal saline was irrigated within the interphalangeal joint.  There was confluent purulent material with the interphalangeal joint due to rent in the palmar capsular tissue.  This was copiously irrigated as was the flexor tendon sheath and thenar eminence.  The  amount of purulence was minimal.  There was some continued erythema within the soft tissue consistent with cellulitis.  The area was milked.   Then a Penrose drain was placed within the thenar eminence and the  wound was sutured in an interrupted loose fashion using 3-0 nylon suture.  Soft dressing was placed.  He was awakened from anesthesia and taken recovery in stable condition.  No complications.  Culture material was sent for microbiologic evaluation  POST OPERATIVE INSTRUCTIONS: Keep dressing in place. We will change the dressing tomorrow Keep Penrose drain in place four days We will adjust antibiotic therapy per hospitalist and microbiology  TOURNIQUET TIME:none  BLOOD LOSS:  less than 50 mL         DRAINS: none         SPECIMEN: none       COMPLICATIONS:  * No complications entered in OR log *         Disposition: PACU - hemodynamically stable.         Condition: stable

## 2021-11-11 NOTE — ED Provider Notes (Signed)
[facility] DEPT Provider Note   CSN: [id number] Arrival date & time: 11/11/21  1221     History  Chief Complaint  Patient presents with   Wound Infection    Prabhjot Piscitello is a 41 y.o. male.  HPI 41 year old male presents as a transfer from [facility] for hand specialty consultation.  On 8/11 he suffered a thumb dislocation.  Also had a laceration that was repaired.  Fracture was reduced in the emergency department.  2 nights ago he states that his mastiff jumped on him and he instinctively caught it with his right hand and since then has been having pain and now worsening swelling.  He states there is streaking going up his arm.  Went back to the emergency department and he had IV antibiotics and IV morphine given.  ED physician had discussed with hand surgery who agreed to except to [facility].  Patient was then transferred here.  He was given IV morphine at the outside hospital. They also gave him Vancomycin and Cefepime. He states he last received a Tdap 6 weeks ago.  Home Medications Prior to Admission medications   Medication Sig Start Date End Date Taking? Authorizing Provider  cephALEXin (KEFLEX) 500 MG capsule Take 1 capsule (500 mg total) by mouth 4 (four) times daily. Patient not taking: Reported on 11/11/2021 08/13/21   Daleen Bo, MD  doxycycline (VIBRAMYCIN) 100 MG capsule Take 1 capsule (100 mg total) by mouth 2 (two) times daily. One po bid x 7 days Patient not taking: Reported on 11/11/2021 08/13/21   Daleen Bo, MD  HYDROcodone-acetaminophen (NORCO/VICODIN) 5-325 MG tablet Take 1 tablet by mouth every 4 (four) hours as needed. Patient not taking: Reported on 11/11/2021 11/08/21   [provider]  naproxen (NAPROSYN) 500 MG tablet Take 500 mg by mouth 2 (two) times daily. Patient not taking: Reported on 11/11/2021 11/08/21   [provider]  oxyCODONE (ROXICODONE) 5 MG immediate release tablet Take 1 tablet (5 mg  total) by mouth every 4 (four) hours as needed for severe pain. Patient not taking: Reported on 08/13/2021 04/06/18   Maudie Flakes, MD  oxyCODONE-acetaminophen (PERCOCET) 5-325 MG tablet Take 1 tablet by mouth every 4 (four) hours as needed for severe pain or moderate pain. Patient not taking: Reported on 11/11/2021 08/13/21   Daleen Bo, MD      Allergies    Patient has no known allergies.    Review of Systems   Review of Systems  Musculoskeletal:  Positive for arthralgias and joint swelling.  Skin:  Positive for color change.    Physical Exam Updated Vital Signs BP (!) 141/97   Pulse 87   Temp 99.7 F (37.6 C) (Oral)   Resp 18   Ht 5\' 7"  (1.702 m)   Wt 93 kg   SpO2 99%   BMI 32.11 kg/m  Physical Exam Vitals and nursing note reviewed.  Constitutional:      Appearance: He is well-developed.  HENT:     Head: Normocephalic and atraumatic.  Cardiovascular:     Rate and Rhythm: Normal rate and regular rhythm.     Pulses:          Radial pulses are 2+ on the right side.  Pulmonary:     Effort: Pulmonary effort is normal.  Abdominal:     General: There is no distension.  Musculoskeletal:     Comments: See picture.  No obvious purulent drainage from the previously repaired wound.  There  is some dorsal thumb swelling and then significant proximal thumb/hand swelling.  Limited range of motion of the thumb.  Skin:    General: Skin is warm and dry.     Findings: Erythema present.  Neurological:     Mental Status: He is alert.        ED Results / Procedures / Treatments   Labs (all labs ordered are listed, but only abnormal results are displayed) Labs Reviewed - No data to display  EKG None  Radiology DG Hand Complete Right  Result Date: 11/11/2021 CLINICAL DATA:  Infection.  Hand injury. EXAM: RIGHT HAND - COMPLETE 3+ VIEW COMPARISON:  Right hand x-ray 08/20/2009 FINDINGS: There is no evidence of fracture or dislocation. There is no evidence of arthropathy or  other focal bone abnormality. There is diffuse soft tissue swelling of the hand. There is a 2 x 1 mm foreign body in the soft tissues of the distal second finger, unchanged from 2011. No new foreign bodies are seen. No soft tissue gas identified. IMPRESSION: 1. Diffuse soft tissue swelling of the hand. 2. No acute bony abnormality. 3. Small foreign body in the second finger is unchanged from 2011. Electronically Signed   By: Ronney Asters M.D.   On: 11/11/2021 15:22    Procedures Procedures    Medications Ordered in ED Medications  acetaminophen (TYLENOL) tablet 650 mg (has no administration in time range)    Or  acetaminophen (TYLENOL) suppository 650 mg (has no administration in time range)  HYDROmorphone (DILAUDID) injection 1 mg (has no administration in time range)  ondansetron (ZOFRAN) tablet 4 mg (has no administration in time range)    Or  ondansetron (ZOFRAN) injection 4 mg (has no administration in time range)  ceFEPIme (MAXIPIME) 2 g in sodium chloride 0.9 % 100 mL IVPB (has no administration in time range)  vancomycin (VANCOREADY) IVPB 750 mg/150 mL (has no administration in time range)  ketorolac (TORADOL) 30 MG/ML injection 30 mg (has no administration in time range)  HYDROmorphone (DILAUDID) injection 1 mg (1 mg Intravenous Given 11/11/21 1310)    ED Course/ Medical Decision Making/ A&P                           Medical Decision Making Amount and/or Complexity of Data Reviewed External Data Reviewed: labs, radiology and notes.    Details: WBC of 14, otherwise normal metabolic panel.  EDP notes reviewed. Radiology: ordered and independent interpretation performed.    Details: Thumb is not dislocated.  No obvious fracture.  Significant thumb swelling  Risk Prescription drug management. Decision regarding hospitalization.   Patient was apparently supposed to [facility] to [facility].  Either way I discussed with Hilbert Odor of hand surgery here and ultimately patient will  need to go to [facility] for incision/drainage tonight at around 6 PM.  He will need IV antibiotics and so orthopedics is requesting hospitalist admission.  Discussed with Dr. Olevia Bowens for admission.  For now we will continue vancomycin and cefepime.  Tdap is up-to-date.  Given IV Dilaudid for pain.        Final Clinical Impression(s) / ED Diagnoses Final diagnoses:  Hand abscess    Rx / DC Orders ED Discharge Orders     None         Sherwood Gambler, MD 11/11/21 1625

## 2021-11-11 NOTE — Progress Notes (Signed)
Pharmacy Antibiotic Note  [name] [name] is a 41 y.o. male admitted on 11/11/2021 with SSI.  Pharmacy has been consulted for Vanc/cefepime dosing. 41 yo tx from [facility] for hand surgery consultation. Suffered thumb dislocation and laceration on 8/11 and had fracture reduction in ER. 2 nights ago, patient's Mastiff jumped on him and caught with his hand and has been having pain, worsening swelling, along with streaking up his arm since then.   Plan: Patient got Vanc 1500mg  x 1 at [facility] at 0735. Start 750mg  IV q12 tonight ~2000, goal AUC 400-550 Patient also got Cefepime 2g IV x 1 at [facility] at [phone]. Per SCr of 1.09 drawn at [facility] and therefore CrCl > 60, start 2g IV q8 thereafter  Height: 5\' 7"  (170.2 cm) Weight: 93 kg (205 lb) IBW/kg (Calculated) : 66.1  Temp (24hrs), Avg:99 F (37.2 C), Min:98.3 F (36.8 C), Max:99.7 F (37.6 C)  No results for input(s): "WBC", "CREATININE", "LATICACIDVEN", "VANCOTROUGH", "VANCOPEAK", "VANCORANDOM", "GENTTROUGH", "GENTPEAK", "GENTRANDOM", "TOBRATROUGH", "TOBRAPEAK", "TOBRARND", "AMIKACINPEAK", "AMIKACINTROU", "AMIKACIN" in the last 168 hours.  CrCl cannot be calculated (Patient's most recent lab result is older than the maximum 21 days allowed.).    No Known Allergies    Thank you for allowing pharmacy to be a part of this patient's care.  Kara Mead 11/11/2021 3:44 PM

## 2021-11-12 ENCOUNTER — Encounter (HOSPITAL_COMMUNITY): Payer: Self-pay | Admitting: Orthopaedic Surgery

## 2021-11-12 ENCOUNTER — Other Ambulatory Visit (HOSPITAL_COMMUNITY): Payer: Self-pay

## 2021-11-12 DIAGNOSIS — M009 Pyogenic arthritis, unspecified: Secondary | ICD-10-CM

## 2021-11-12 LAB — CBC
HCT: 39.9 % (ref 39.0–52.0)
HCT: 40.6 % (ref 39.0–52.0)
Hemoglobin: 14.2 g/dL (ref 13.0–17.0)
Hemoglobin: 14.8 g/dL (ref 13.0–17.0)
MCH: 32.9 pg (ref 26.0–34.0)
MCH: 33.3 pg (ref 26.0–34.0)
MCHC: 35.6 g/dL (ref 30.0–36.0)
MCHC: 36.5 g/dL — ABNORMAL HIGH (ref 30.0–36.0)
MCV: 92.4 fL (ref 80.0–100.0)
MCV: 91.2 fL (ref 80.0–100.0)
Platelets: 172 10*3/uL (ref 150–400)
Platelets: 185 10*3/uL (ref 150–400)
RBC: 4.32 MIL/uL (ref 4.22–5.81)
RBC: 4.45 MIL/uL (ref 4.22–5.81)
RDW: 13.9 % (ref 11.5–15.5)
RDW: 13.6 % (ref 11.5–15.5)
WBC: 14 10*3/uL — ABNORMAL HIGH (ref 4.0–10.5)
WBC: 14.4 10*3/uL — ABNORMAL HIGH (ref 4.0–10.5)
nRBC: 0 % (ref 0.0–0.2)
nRBC: 0 % (ref 0.0–0.2)

## 2021-11-12 LAB — COMPREHENSIVE METABOLIC PANEL
ALT: 14 U/L (ref 0–44)
AST: 15 U/L (ref 15–41)
Albumin: 3.6 g/dL (ref 3.5–5.0)
Alkaline Phosphatase: 56 U/L (ref 38–126)
Anion gap: 10 (ref 5–15)
BUN: 12 mg/dL (ref 6–20)
CO2: 23 mmol/L (ref 22–32)
Calcium: 8.6 mg/dL — ABNORMAL LOW (ref 8.9–10.3)
Chloride: 104 mmol/L (ref 98–111)
Creatinine, Ser: 0.96 mg/dL (ref 0.61–1.24)
GFR, Estimated: >60 mL/min (ref >60)
Glucose, Bld: 139 mg/dL — ABNORMAL HIGH (ref 70–99)
Potassium: 4.3 mmol/L (ref 3.5–5.1)
Sodium: 137 mmol/L (ref 135–145)
Total Bilirubin: 1 mg/dL (ref 0.3–1.2)
Total Protein: 7 g/dL (ref 6.5–8.1)

## 2021-11-12 LAB — HIV ANTIBODY (ROUTINE TESTING W REFLEX): HIV Screen 4th Generation wRfx: NONREACTIVE

## 2021-11-12 MED ORDER — CEPHALEXIN 500 MG PO CAPS
500.0000 mg | ORAL_CAPSULE | Freq: Four times a day (QID) | ORAL | 0 refills | Status: AC
  Filled 2021-11-12 (×2): qty 40, 10d supply, fill #0

## 2021-11-12 MED ORDER — OXYCODONE HCL 5 MG PO TABS
5.0000 mg | ORAL_TABLET | Freq: Four times a day (QID) | ORAL | Status: DC | PRN
  Administered 2021-11-12: 5 mg via ORAL
  Filled 2021-11-12: qty 1

## 2021-11-12 MED ORDER — OXYCODONE-ACETAMINOPHEN 5-325 MG PO TABS
1.0000 | ORAL_TABLET | Freq: Three times a day (TID) | ORAL | 0 refills | Status: DC | PRN
  Filled 2021-11-12: qty 15, 5d supply, fill #0

## 2021-11-12 MED ORDER — NAPROXEN 500 MG PO TABS
500.0000 mg | ORAL_TABLET | Freq: Two times a day (BID) | ORAL | 0 refills | Status: DC
  Filled 2021-11-12 (×2): qty 20, 10d supply, fill #0

## 2021-11-12 MED ORDER — ORAL CARE MOUTH RINSE: 15.0000 mL | OROMUCOSAL | Status: DC | PRN

## 2021-11-12 MED ORDER — KETOROLAC TROMETHAMINE 30 MG/ML IJ SOLN
30.0000 mg | Freq: Four times a day (QID) | INTRAMUSCULAR | Status: DC | PRN
  Administered 2021-11-12: 30 mg via INTRAVENOUS
  Filled 2021-11-12: qty 1

## 2021-11-12 MED ORDER — KETOROLAC TROMETHAMINE 30 MG/ML IJ SOLN
30.0000 mg | Freq: Three times a day (TID) | INTRAMUSCULAR | Status: DC
  Filled 2021-11-12: qty 1

## 2021-11-12 MED ORDER — OXYCODONE-ACETAMINOPHEN 5-325 MG PO TABS
1.0000 | ORAL_TABLET | Freq: Three times a day (TID) | ORAL | 0 refills | Status: AC | PRN
  Filled 2021-11-12: qty 15, 5d supply, fill #0

## 2021-11-12 MED ORDER — CEPHALEXIN 500 MG PO CAPS
500.0000 mg | ORAL_CAPSULE | Freq: Four times a day (QID) | ORAL | 0 refills | Status: AC
Start: 2021-11-12 — End: 2021-11-22
  Filled 2021-11-12: qty 40, 10d supply, fill #0

## 2021-11-12 MED ORDER — MORPHINE SULFATE (PF) 4 MG/ML IV SOLN
4.0000 mg | INTRAVENOUS | Status: DC | PRN
  Administered 2021-11-12: 4 mg via INTRAVENOUS
  Filled 2021-11-12: qty 1

## 2021-11-12 MED ORDER — DOXYCYCLINE HYCLATE 100 MG PO CAPS
100.0000 mg | ORAL_CAPSULE | Freq: Two times a day (BID) | ORAL | 0 refills | Status: AC
  Filled 2021-11-12: qty 20, 10d supply, fill #0

## 2021-11-12 MED ORDER — VANCOMYCIN HCL IN DEXTROSE 1-5 GM/200ML-% IV SOLN
1000.0000 mg | Freq: Two times a day (BID) | INTRAVENOUS | Status: DC
  Administered 2021-11-12: 1000 mg via INTRAVENOUS
  Filled 2021-11-12 (×2): qty 200

## 2021-11-12 MED ORDER — DOXYCYCLINE HYCLATE 100 MG PO CAPS
100.0000 mg | ORAL_CAPSULE | Freq: Two times a day (BID) | ORAL | 0 refills | Status: AC
  Filled 2021-11-12 (×2): qty 20, 10d supply, fill #0

## 2021-11-12 NOTE — Assessment & Plan Note (Addendum)
Flexor tenosynovitis Thenar abscess  S/p Irrigation and debridement of right thumb interphalangeal joint septic arthritis and left thumb flexor tenosynovitis and thenar abscess on 8/14 by Dr. Lucia Gaskins

## 2021-11-12 NOTE — Progress Notes (Signed)
PT Cancellation Note  Patient Details Name: [name] [name] MRN: [id number] DOB: Jun 23, 1980   Cancelled Treatment:    Reason Eval/Treat Not Completed: PT screened, no needs identified, will sign off; pt amb independently per OT note.    [facility] 11/12/2021, 2:12 PM

## 2021-11-12 NOTE — Hospital Course (Signed)
[name] [name] is a 41 y.o. M with no sig PMHx who presented with open thumb dislocation.  Few days ago, injured hand, had the dislocation reduced in the ER and the wound was sutured.  However, 2 nights PTA his mastiff dog jumped on him and wound got worse, so he returned to the ER.  8/14: Transferred from [place] with antibiotics, Ortho consulted, took to OR

## 2021-11-12 NOTE — Progress Notes (Signed)
Pharmacy Antibiotic Note  Ann Groeneveld is a 41 y.o. male admitted on 11/11/2021 with concern for septic joint infection. Pt suffered thumb dislocation and laceration on 8/11 and had fracture reduction in ER. Subsequently, patients dog jumped on him and caught with his hand and has been having pain, worsening swelling, along with streaking up his arm since that time.   Pt transferred from [facility] for hand surgery consultation. Pharmacy consulted to dose cefepime + vancomycin (initial doses given at [facility]).  Significant Events: -8/14 I&D for right thumb joint septic arthritis and abscess with Penrose drain placement  Today, 11/12/21 WBC elevated SCr WNL Afebrile  Plan: Continue cefepime 2 g IV q12h Increase vancomycin to 1000 mg IV q12h for estimated AUC of 496 Goal vancomycin AUC 400-500. Check levels at steady state as needed Monitor culture data  Height: 5\' 7"  (170.2 cm) Weight: 99.8 kg (220 lb) IBW/kg (Calculated) : 66.1  Temp (24hrs), Avg:98.3 F (36.8 C), Min:97.5 F (36.4 C), Max:99.7 F (37.6 C)  Recent Labs  Lab 11/12/21 0355 11/12/21 0818  WBC 14.0* 14.4*  CREATININE 0.96  --     Estimated Creatinine Clearance: 115.2 mL/min (by C-G formula based on SCr of 0.96 mg/dL).    No Known Allergies  Antimicrobials this admission:  8/14 cefepime >>  8/14 vancomycin >>   Dose adjustments this admission:  8/15: Vanc 750 mg q12h >> 1 g q12h  Microbiology results:  8/14 Synovial fluid:   Lenis Noon, PharmD 11/12/2021 8:55 AM

## 2021-11-12 NOTE — Discharge Summary (Signed)
Physician Discharge Summary   Patient: [name] [name] MRN: [id number] DOB: Jul 19, 1980  Admit date:     11/11/2021  Discharge date: 11/12/21  Discharge Physician: Edwin Dada   PCP: Patient, No Pcp Per     Recommendations at discharge:  Follow up with Dr. Lucia Gaskins on Friday Dr. Lucia Gaskins: Please follow up final intraoperative culture data     Discharge Diagnoses: Principal Problem:   Septic arthritis of interphalangeal joint of finger of right hand [facility])      Hospital Course: [name] [name] is a 41 y.o. M with no sig PMHx who presented with open thumb dislocation.  Few days ago, injured hand, had the dislocation reduced in the ER and the wound was sutured.  However, 2 nights PTA his mastiff dog jumped on him and wound got worse, so he returned to the ER.  Transferred from [place] with antibiotics, Ortho consulted, took to OR    * Septic arthritis of interphalangeal joint of finger of right hand (HCC) Flexor tenosynovitis Thenar abscess  S/p Irrigation and debridement of right thumb interphalangeal joint septic arthritis and left thumb flexor tenosynovitis and thenar abscess on 8/14 by Dr. Lucia Gaskins   Treated with IV antibiotics.  The patient had an uncomplicated post-op course. His pain was controlled with oral agents and he was without fever.  Cultures were negative at the time of discharge, so he was prescribed doxycycline and cephalexin and given pain control and wound care instructions and follow up plans within 3 days.             The [facility] Controlled Substances Registry was reviewed for this patient prior to discharge.  Consultants: Orthopedics Procedures performed:  -  Irrigation and debridement of right thumb interphalangeal joint septic arthritis -  Irrigation and debridement of left thumb flexor tenosynovitis -  Irrigation and debridement of thenar abscess    Disposition: Home Diet recommendation:  Discharge Diet Orders (From admission,  onward)     Start     Ordered   11/12/21 0000  Diet - low sodium heart healthy        11/12/21 1505             DISCHARGE MEDICATION: Allergies as of 11/12/2021   No Known Allergies      Medication List     STOP taking these medications    HYDROcodone-acetaminophen 5-325 MG tablet Commonly known as: NORCO/VICODIN   oxyCODONE 5 MG immediate release tablet Commonly known as: Roxicodone       TAKE these medications    cephALEXin 500 MG capsule Commonly known as: KEFLEX Take 1 capsule (500 mg total) by mouth 4 (four) times daily for 10 days. What changed: You were already taking a medication with the same name, and this prescription was added. Make sure you understand how and when to take each.   cephALEXin 500 MG capsule Commonly known as: Keflex Take 1 capsule by mouth 4 times daily for 10 days. What changed: Another medication with the same name was added. Make sure you understand how and when to take each.   doxycycline 100 MG capsule Commonly known as: VIBRAMYCIN Take 1 capsule (100 mg total) by mouth 2 (two) times daily for 10 days. What changed: You were already taking a medication with the same name, and this prescription was added. Make sure you understand how and when to take each.   doxycycline 100 MG capsule Commonly known as: VIBRAMYCIN Take 1 capsule by mouth 2 times daily for  10 days. What changed: additional instructions   naproxen 500 MG tablet Commonly known as: NAPROSYN Take 1 tablet by mouth 2 times daily with a meal. What changed: when to take this   oxyCODONE-acetaminophen 5-325 MG tablet Commonly known as: Percocet Take 1 tablet by mouth every 8 hours as needed for up to 5 days for severe pain or moderate pain. What changed: when to take this               Discharge Care Instructions  (From admission, onward)           Start     Ordered   11/12/21 0000  Discharge wound care:       Comments: Leave dressing in place  until appointment Friday   11/12/21 1505            Follow-up [place]. Schedule an appointment as soon as possible for a visit.   Contact information: [place]        Erle Crocker, MD Follow up.   Specialty: Orthopedic Surgery Contact information: [place] [address] [phone]                 Discharge Instructions     Diet - low sodium heart healthy   Complete by: As directed    Discharge instructions   Complete by: As directed    From Dr. Loleta Books: You were admitted with infection in the joint and tendons of your right thumb  You had surgery to clean this infection out  You should leave the dressing in place and go see Dr. Lucia Gaskins at his office (see below in the To Do section) in 3 days on Friday  Call his office if you have any questions or concerns about your hand, they have an after hours number as well  For infection, take doxycycline and cephalexin: Doxycycline can cause some irritation of the skin if you are in too much sun, so avoid too much direct sunlight while taking it for the next 10 days Take doxycycline 100 mg twice daily Take cephalexin 500 mg 4 times daily   For pain: Take Percocet 5-325 mg up to 3 times per day Observe the precautions I recommended  In addition, take naproxen 500 mg (this is one prescription tablet or 2 over the counter tablets) twice daily  Go see Dr. Lucia Gaskins   Discharge wound care:   Complete by: As directed    Leave dressing in place until appointment Friday   Increase activity slowly   Complete by: As directed        Discharge Exam: Filed Weights   11/11/21 1242 11/11/21 1744  Weight: 93 kg 99.8 kg    General: Pt is alert, awake, not in acute distress Cardiovascular: RRR, nl S1-S2, no murmurs appreciated.   No LE edema.   Respiratory: Normal respiratory rate and rhythm.  CTAB without  rales or wheezes. Abdominal: Abdomen soft and non-tender.  No distension or HSM.   Neuro/Psych: Strength symmetric in upper and lower extremities.  Judgment and insight appear normal.   Condition at discharge: good  The results of significant diagnostics from this hospitalization (including imaging, microbiology, ancillary and laboratory) are listed below for reference.   Imaging Studies: DG Hand Complete Right  Result Date: 11/11/2021 CLINICAL DATA:  Infection.  Hand injury. EXAM: RIGHT HAND - COMPLETE 3+ VIEW COMPARISON:  Right hand  x-ray 08/20/2009 FINDINGS: There is no evidence of fracture or dislocation. There is no evidence of arthropathy or other focal bone abnormality. There is diffuse soft tissue swelling of the hand. There is a 2 x 1 mm foreign body in the soft tissues of the distal second finger, unchanged from 2011. No new foreign bodies are seen. No soft tissue gas identified. IMPRESSION: 1. Diffuse soft tissue swelling of the hand. 2. No acute bony abnormality. 3. Small foreign body in the second finger is unchanged from 2011. Electronically Signed   By: Ronney Asters M.D.   On: 11/11/2021 15:22    Microbiology: Results for orders placed or performed during the hospital encounter of 11/11/21  Aerobic/Anaerobic Culture w Gram Stain (surgical/deep wound)     Status: None (Preliminary result)   Collection Time: 11/11/21  6:48 PM   Specimen: Synovial, Right Finger; Body Fluid  Result Value Ref Range Status   Specimen Description   Final    SYNOVIAL Performed at [facility] [address], [place], [place] [address]    Special Requests   Final    NONE RIGHT FINGER Performed at [facility] [address] [place], [place] [address]    Gram Stain NO ORGANISMS SEEN NO WBC SEEN   Final   Culture   Final    NO GROWTH < 12 HOURS Performed at [facility], [place] [address], [place], [place] [address]    Report Status PENDING   Incomplete    Labs: CBC: Recent Labs  Lab 11/12/21 0355 11/12/21 0818  WBC 14.0* 14.4*  HGB 14.2 14.8  HCT 39.9 40.6  MCV 92.4 91.2  PLT 172 122   Basic Metabolic Panel: Recent Labs  Lab 11/12/21 0355  NA 137  K 4.3  CL 104  CO2 23  GLUCOSE 139*  BUN 12  CREATININE 0.96  CALCIUM 8.6*   Liver Function Tests: Recent Labs  Lab 11/12/21 0355  AST 15  ALT 14  ALKPHOS 56  BILITOT 1.0  PROT 7.0  ALBUMIN 3.6   CBG: No results for input(s): "GLUCAP" in the last 168 hours.  Discharge time spent: approximately 35 minutes spent on discharge counseling, evaluation of patient on day of discharge, and coordination of discharge planning with nursing, social work, pharmacy and case management  Signed: Edwin Dada, MD [facility] 11/12/2021

## 2021-11-12 NOTE — Anesthesia Postprocedure Evaluation (Signed)
Anesthesia Post Note  Patient: [name] [name]  Procedure(s) Performed: IRRIGATION AND DEBRIDEMENT EXTREMITY (Right)     Patient location during evaluation: PACU Anesthesia Type: General Level of consciousness: awake and alert Pain management: pain level controlled Vital Signs Assessment: post-procedure vital signs reviewed and stable Respiratory status: spontaneous breathing, nonlabored ventilation, respiratory function stable and patient connected to nasal cannula oxygen Cardiovascular status: blood pressure returned to baseline and stable Postop Assessment: no apparent nausea or vomiting Anesthetic complications: no   No notable events documented.  Last Vitals:  Vitals:   11/12/21 0306 11/12/21 0538  BP: 132/87 (!) 166/98  Pulse: 80 92  Resp: 17 17  Temp:  36.7 C  SpO2:  100%    Last Pain:  Vitals:   11/12/21 0830  TempSrc:   PainSc: 8                  Daymen Hassebrock

## 2021-11-12 NOTE — Evaluation (Signed)
Occupational Therapy Evaluation Patient Details Name: [name] [name] MRN: [id number] DOB: 06-09-1980 Today's Date: 11/12/2021   History of Present Illness 41 y.o. male sustained an open thumb interphalangeal joint dislocation that underwent closed reduction and closure of the wound in [place] at the emergency department.  This occurred 2 weeks ago.  Patient had sudden onset of pain within the left thumb after his dog jumped on him.  Pt admitted on 8/14 with Post Operative Diagnosis:  Right thumb interphalangeal joint septic arthritis  Flexor tenosynovitis  Thenar abscess     Underwent:  Irrigation and debridement of right thumb interphalangeal joint septic arthritis  Irrigation and debridement of left thumb flexor tenosynovitis  Irrigation debridement of thumb thenar abscess on 11/11/21   Clinical Impression   Patient evaluated by Occupational Therapy with no further OT needs identified for now, however if pt requires reinforcement of education from this visit, please re-order OT prior to pt returning home. All education has been completed including pain and edema management, gentle ROM HEP and 1-handed compensatory techniques for ADLs, and the patient has no further questions. Pt reports very limited assistance at home and expressed concern about this.  See below for any follow-up Occupational Therapy or equipment needs. OT is signing off. For now Thank you for this referral.      Recommendations for follow up therapy are one component of a multi-disciplinary discharge planning process, led by the attending physician.  Recommendations may be updated based on patient status, additional functional criteria and insurance authorization.   Follow Up Recommendations  Follow physician's recommendations for discharge plan and follow up therapies    Assistance Recommended at Discharge Set up Supervision/Assistance  Patient can return home with the following A little help with bathing/dressing/bathroom     Functional Status Assessment  Patient has had a recent decline in their functional status and demonstrates the ability to make significant improvements in function in a reasonable and predictable amount of time.  Equipment Recommendations  None recommended by OT    Recommendations for Other Services       Precautions / Restrictions Restrictions Weight Bearing Restrictions: Yes RUE Weight Bearing: Non weight bearing      Mobility Bed Mobility Overal bed mobility: Modified Independent             General bed mobility comments: EDcuated on NWB to RT hand and to avoid pushing with RT hand to mobilize OOB    Transfers                          Balance Overall balance assessment: Modified Independent                                         ADL either performed or assessed with clinical judgement   ADL Overall ADL's : Needs assistance/impaired Eating/Feeding: Set up Eating/Feeding Details (indicate cue type and reason): Needs assistance with opening containers.   Grooming Details (indicate cue type and reason): Pt educated on 1-handed techniques for opeing toothpaste and applying to toothrush. Pt demonstrated his own way back which was successful. Upper Body Bathing: Modified independent   Lower Body Bathing: Modified independent   Upper Body Dressing : Modified independent Upper Body Dressing Details (indicate cue type and reason): Pt educated on dressing RUE first for ease. Lower Body Dressing: Modified independent Lower Body Dressing Details (indicate cue  type and reason): Pt educated on 1-handed techniques for donning socks which pt demonstrated back Mod I. Pt educated on free youtube videos for one handed tying shoes, however pt reported plan to wear slides. Toilet Transfer: Systems analyst;Ambulation Toilet Transfer Details (indicate cue type and reason): Pt stood from EOB Mod I and rasped IV pole with LT hand to  ambulate to/from bathroom. Mod I transfer to standard toilet. Toileting- Clothing Manipulation and Hygiene: Modified independent       Functional mobility during ADLs: Modified independent General ADL Comments: Pt educated on free resources for 1-handed technique education and pt verbalized understanding.     Vision Baseline Vision/History: 0 No visual deficits       Perception     Praxis      Pertinent Vitals/Pain Pain Assessment Pain Assessment: 0-10 Pain Score: 8  Pain Location: RUE-pt reports very poor sleep last night as pain became much more noticable. Pain Descriptors / Indicators: Burning, Pins and needles Pain Intervention(s): Limited activity within patient's tolerance, Monitored during session, Premedicated before session, Repositioned, Patient requesting pain meds-RN notified, Relaxation (Pt educated on free youtube videos through his smart phone for guided pain reduction meditations for distraction when trying to fal asleep.)     Hand Dominance Right   Extremity/Trunk Assessment Upper Extremity Assessment Upper Extremity Assessment: RUE deficits/detail RUE Deficits / Details: Thumb immobilized. D2 with limited flexion. All digits with limited lateral adduction after abduction.  Shoulder and elbow ROM intact but pt guarding movement concerned it will spread infection. Pt educated on importance of continued gentle movement to maintain joint range. RUE: Unable to fully assess due to immobilization;Unable to fully assess due to pain RUE Sensation: decreased light touch;decreased proprioception RUE Coordination: decreased fine motor   Lower Extremity Assessment Lower Extremity Assessment: Defer to PT evaluation   Cervical / Trunk Assessment Cervical / Trunk Assessment: Normal   Communication Communication Communication: No difficulties   Cognition Arousal/Alertness: Awake/alert Behavior During Therapy: Anxious, Impulsive, Restless Overall Cognitive Status:  Within Functional Limits for tasks assessed                                 General Comments: Very nice gentleman with difficuty attending to instructions. Pt highly fixated on his symptoms.     General Comments  Pt endorsed recent dizziness with standing and ambulting, but reported that he did not experience this during OT visit.    Exercises Other Exercises Other Exercises: Pt educated on gentle AROM to RT digits as tolerated and pt demonstrated back but needs cues to avoid pushing once pain increases. Pt demonstrated 10 reps gentle finger flexion/ext and latearl adb/add as tolerated. Pt show how to rest RT forearm on head for increased elevation and edema management. Pt able to demonstrate back.   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  OT Problem List: Impaired UE functional use;Pain      OT Treatment/Interventions:      OT Goals(Current goals can be found in the care plan section) Acute Rehab OT Goals Patient Stated Goal: For pain to decrease OT Goal Formulation: All assessment and education complete, DC therapy Potential to Achieve Goals: Good ADL Goals Pt/caregiver will Perform Home Exercise Program: Increased ROM;Right Upper extremity;Independently Additional ADL Goal #1: Pt will verbalize and/or demonstrate 1-handed techniques to complete ADLs at highest level of  independence.  OT Frequency:      Co-evaluation              AM-PAC OT "6 Clicks" Daily Activity     Outcome Measure Help from another person eating meals?: A Little Help from another person taking care of personal grooming?: A Little Help from another person toileting, which includes using toliet, bedpan, or urinal?: A Little Help from another person bathing (including washing, rinsing, drying)?: A Little Help from another person to put on and taking off regular upper body clothing?: None Help from another person to put on and taking off regular lower body clothing?: A Little 6 Click Score: 19   End of Session Nurse Communication: Patient requests pain meds;Weight bearing status;Other (comment) (OT to sign off but can reorder OT before discharge home if pt needs reinforcement.)  Activity Tolerance: Patient tolerated treatment well Patient left: in bed  OT Visit Diagnosis: Pain Pain - Right/Left: Right Pain - part of body: Hand;Arm                Time: 8675-4492 OT Time Calculation (min): 32 min Charges:  OT General Charges $OT Visit: 1 Visit OT Evaluation $OT Eval Low Complexity: 1 Low OT Treatments $Self Care/Home Management : 8-22 mins  Anderson [place], [place] Office: [phone] 11/12/2021  Julien Girt 11/12/2021, 9:23 AM

## 2021-11-12 NOTE — Progress Notes (Signed)
     [name] [name] is a 41 y.o. male   Orthopaedic diagnosis: Right thumb IP joint septic arthritis, flexor tenosynovitis,  and thenar abscess status post I&D 11/11/2021  Subjective: Patient appears comfortable in bed.  Pain well controlled.  He has been out of bed.  Denies any bowel or bladder issues.  He has been seen by occupational therapy.  Intraoperative culture results pending.  No new concerns. Looking forward to going home.  Objectyive: Vitals:   11/12/21 0538 11/12/21 0932  BP: (!) 166/98 (!) 151/77  Pulse: 92 93  Resp: 17   Temp: 98.1 F (36.7 C) (!) 97.4 F (36.3 C)  SpO2: 100% 98%     Exam: Awake and alert Respirations even and unlabored No acute distress  Right hand shows intact surgical dressing.  It was gently removed.  Sutures and Penrose drain in place.  There is mild serosanguineous drainage from the drain.  There is interval improvement in erythema and swelling about the thumb.  Capillary refill is brisk distally.  He has good range of motion in the fingers.  Range of motion of the thumb not assessed.  The dressing was reapplied using cling, 4x4 gauze and an ace wrap. NVI after dressing.   Assessment: Postop day 1 status post above, suitable for discharge home from orthopedic standpoint once cleared by the hospitalist team.  Plan: -Keep dressing clean, dry, and intact -Nonweightbearing through the right hand, may weight-bear through the right forearm or elbow. -Oral antibiotics and pain control per hospitalist team  Plan for outpatient follow-up appointment Friday 11/15/21 with Dr. Lucia Gaskins at [facility] for further evaluation and likely removal of the Penrose drain.   [place] Urieta J. [place], PA-C

## 2021-11-14 LAB — ACID FAST SMEAR (AFB, MYCOBACTERIA): Acid Fast Smear: NEGATIVE

## 2021-11-16 LAB — AEROBIC/ANAEROBIC CULTURE W GRAM STAIN (SURGICAL/DEEP WOUND): Gram Stain: NONE SEEN

## 2021-12-20 ENCOUNTER — Other Ambulatory Visit (HOSPITAL_BASED_OUTPATIENT_CLINIC_OR_DEPARTMENT_OTHER): Payer: Self-pay

## 2021-12-27 ENCOUNTER — Other Ambulatory Visit (HOSPITAL_BASED_OUTPATIENT_CLINIC_OR_DEPARTMENT_OTHER): Payer: Self-pay

## 2021-12-28 LAB — ACID FAST CULTURE WITH REFLEXED SENSITIVITIES (MYCOBACTERIA): Acid Fast Culture: NEGATIVE

## 2022-01-05 ENCOUNTER — Emergency Department (HOSPITAL_COMMUNITY)

## 2022-01-05 ENCOUNTER — Encounter (HOSPITAL_COMMUNITY): Payer: Self-pay | Admitting: Emergency Medicine

## 2022-01-05 ENCOUNTER — Emergency Department (HOSPITAL_COMMUNITY)
Admission: EM | Admit: 2022-01-05 | Discharge: 2022-01-06 | Disposition: A | Discharge status: Home / Self Care | Attending: Emergency Medicine | Admitting: Emergency Medicine

## 2022-01-05 DIAGNOSIS — R410 Disorientation, unspecified: Secondary | ICD-10-CM | POA: Insufficient documentation

## 2022-01-05 DIAGNOSIS — Z043 Encounter for examination and observation following other accident: Secondary | ICD-10-CM | POA: Insufficient documentation

## 2022-01-05 DIAGNOSIS — R000 Tachycardia, unspecified: Secondary | ICD-10-CM | POA: Insufficient documentation

## 2022-01-05 DIAGNOSIS — D696 Thrombocytopenia, unspecified: Secondary | ICD-10-CM

## 2022-01-05 DIAGNOSIS — F1910 Other psychoactive substance abuse, uncomplicated: Secondary | ICD-10-CM | POA: Insufficient documentation

## 2022-01-05 LAB — CBC
HCT: 46.5 % (ref 39.0–52.0)
HCT: 40.6 % (ref 39.0–52.0)
Hemoglobin: 16.4 g/dL (ref 13.0–17.0)
Hemoglobin: 15 g/dL (ref 13.0–17.0)
MCH: 33.1 pg (ref 26.0–34.0)
MCH: 33.9 pg (ref 26.0–34.0)
MCHC: 35.3 g/dL (ref 30.0–36.0)
MCHC: 36.9 g/dL — ABNORMAL HIGH (ref 30.0–36.0)
MCV: 93.9 fL (ref 80.0–100.0)
MCV: 91.6 fL (ref 80.0–100.0)
Platelets: 40 10*3/uL — ABNORMAL LOW (ref 150–400)
Platelets: 125 10*3/uL — ABNORMAL LOW (ref 150–400)
RBC: 4.95 MIL/uL (ref 4.22–5.81)
RBC: 4.43 MIL/uL (ref 4.22–5.81)
RDW: 13.5 % (ref 11.5–15.5)
RDW: 13.5 % (ref 11.5–15.5)
WBC: 9.6 10*3/uL (ref 4.0–10.5)
WBC: 8.9 10*3/uL (ref 4.0–10.5)
nRBC: 0 % (ref 0.0–0.2)
nRBC: 0 % (ref 0.0–0.2)

## 2022-01-05 LAB — COMPREHENSIVE METABOLIC PANEL
ALT: 22 U/L (ref 0–44)
AST: 27 U/L (ref 15–41)
Albumin: 3.9 g/dL (ref 3.5–5.0)
Alkaline Phosphatase: 63 U/L (ref 38–126)
Anion gap: 12 (ref 5–15)
BUN: 9 mg/dL (ref 6–20)
CO2: 21 mmol/L — ABNORMAL LOW (ref 22–32)
Calcium: 8.6 mg/dL — ABNORMAL LOW (ref 8.9–10.3)
Chloride: 111 mmol/L (ref 98–111)
Creatinine, Ser: 1.48 mg/dL — ABNORMAL HIGH (ref 0.61–1.24)
GFR, Estimated: >60 mL/min (ref >60)
Glucose, Bld: 80 mg/dL (ref 70–99)
Potassium: 3.7 mmol/L (ref 3.5–5.1)
Sodium: 144 mmol/L (ref 135–145)
Total Bilirubin: 1.2 mg/dL (ref 0.3–1.2)
Total Protein: 6.7 g/dL (ref 6.5–8.1)

## 2022-01-05 LAB — RAPID URINE DRUG SCREEN, HOSP PERFORMED
Amphetamines: POSITIVE — AB
Barbiturates: NOT DETECTED
Benzodiazepines: NOT DETECTED
Cocaine: POSITIVE — AB
Opiates: NOT DETECTED
Tetrahydrocannabinol: NOT DETECTED

## 2022-01-05 LAB — PROTIME-INR
INR: 1.1 (ref 0.8–1.2)
Prothrombin Time: 14.3 seconds (ref 11.4–15.2)

## 2022-01-05 LAB — I-STAT CHEM 8, ED
BUN: 10 mg/dL (ref 6–20)
Calcium, Ion: 0.94 mmol/L — ABNORMAL LOW (ref 1.15–1.40)
Chloride: 112 mmol/L — ABNORMAL HIGH (ref 98–111)
Creatinine, Ser: 1.3 mg/dL — ABNORMAL HIGH (ref 0.61–1.24)
Glucose, Bld: 77 mg/dL (ref 70–99)
HCT: 44 % (ref 39.0–52.0)
Hemoglobin: 15 g/dL (ref 13.0–17.0)
Potassium: 3.9 mmol/L (ref 3.5–5.1)
Sodium: 144 mmol/L (ref 135–145)
TCO2: 21 mmol/L — ABNORMAL LOW (ref 22–32)

## 2022-01-05 LAB — URINALYSIS, ROUTINE W REFLEX MICROSCOPIC
Bacteria, UA: NONE SEEN
Bilirubin Urine: NEGATIVE
Glucose, UA: NEGATIVE mg/dL
Hgb urine dipstick: NEGATIVE
Ketones, ur: 20 mg/dL — AB
Leukocytes,Ua: NEGATIVE
Nitrite: NEGATIVE
Protein, ur: 100 mg/dL — AB
Specific Gravity, Urine: 1.038 — ABNORMAL HIGH (ref 1.005–1.030)
pH: 6 (ref 5.0–8.0)

## 2022-01-05 LAB — ETHANOL: Alcohol, Ethyl (B): <10 mg/dL (ref <10)

## 2022-01-05 LAB — LACTIC ACID, PLASMA: Lactic Acid, Venous: 0.7 mmol/L (ref 0.5–1.9)

## 2022-01-05 MED ORDER — STERILE WATER FOR INJECTION IJ SOLN
INTRAMUSCULAR | Status: AC
  Filled 2022-01-05: qty 10

## 2022-01-05 MED ORDER — SODIUM CHLORIDE 0.9 % IV BOLUS
1000.0000 mL | Freq: Once | INTRAVENOUS | Status: AC
  Administered 2022-01-05: 1000 mL via INTRAVENOUS

## 2022-01-05 MED ORDER — IOHEXOL 350 MG/ML SOLN: 75.0000 mL | Freq: Once | INTRAVENOUS | Status: DC | PRN

## 2022-01-05 MED ORDER — IOHEXOL 350 MG/ML SOLN
75.0000 mL | Freq: Once | INTRAVENOUS | Status: AC | PRN
  Administered 2022-01-05: 75 mL via INTRAVENOUS

## 2022-01-05 MED ORDER — LORAZEPAM 2 MG/ML IJ SOLN
INTRAMUSCULAR | Status: AC
  Administered 2022-01-05: 2 mg
  Filled 2022-01-05: qty 1

## 2022-01-05 MED ORDER — ZIPRASIDONE MESYLATE 20 MG IM SOLR
20.0000 mg | Freq: Once | INTRAMUSCULAR | Status: AC
  Administered 2022-01-05: 20 mg via INTRAMUSCULAR
  Filled 2022-01-05: qty 20

## 2022-01-05 MED ORDER — LORAZEPAM 2 MG/ML IJ SOLN
1.0000 mg | Freq: Once | INTRAMUSCULAR | Status: AC
  Administered 2022-01-05: 1 mg via INTRAVENOUS
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
## 2022-01-05 NOTE — Discharge Instructions (Signed)
Your platelet count is low.  This could cause you to bleed easily.  You need to avoid alcohol, cocaine, and meth.  You need to establish care with a PCP to get your blood rechecked.

## 2022-01-05 NOTE — Progress Notes (Signed)
Orthopedic Tech Progress Note Patient Details:  [name] [name] [birth year] [id number]  Patient ID: [name] [name], male   DOB: April 12, 1980, 41 y.o.   MRN: [id number] Level II; not currently needed. Vernona Rieger 01/05/2022, 5:10 PM
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
## 2022-01-05 NOTE — ED Notes (Signed)
Unable to obtain labs due to pt being agitated and unable to sit still.

## 2022-01-05 NOTE — ED Notes (Signed)
Attempted to change and ambulate pt, pt is sleepy and unable to stay awake long enough to stand up

## 2022-01-06 NOTE — ED Notes (Signed)
Pt changed and ambulated to bathroom, pt had steady gait

## 2022-01-06 NOTE — ED Notes (Addendum)
Pt verbalized understanding of d/c instructions, follow up and medications for discomfort. Pt ambulatory to [place] for sister to pick up, pt spoke with sister Rodena Piety, she will send other sister to pick him up.

## 2022-03-20 ENCOUNTER — Emergency Department (HOSPITAL_COMMUNITY): Payer: Medicaid Other

## 2022-03-20 ENCOUNTER — Inpatient Hospital Stay (HOSPITAL_COMMUNITY): Payer: Medicaid Other | Admitting: Certified Registered Nurse Anesthetist

## 2022-03-20 ENCOUNTER — Inpatient Hospital Stay (HOSPITAL_COMMUNITY)
Admission: EM | Admit: 2022-03-20 | Discharge: 2022-04-08 | DRG: 904 | Disposition: A | Discharge status: Rehabilitation Facility | Payer: Medicaid Other | Attending: Internal Medicine | Admitting: Internal Medicine

## 2022-03-20 ENCOUNTER — Encounter (HOSPITAL_COMMUNITY)
Admission: EM | Disposition: A | Discharge status: Rehabilitation Facility | Payer: Self-pay | Source: Home / Self Care | Attending: Internal Medicine

## 2022-03-20 ENCOUNTER — Encounter (HOSPITAL_COMMUNITY): Payer: Self-pay | Admitting: Pulmonary Disease

## 2022-03-20 DIAGNOSIS — G9341 Metabolic encephalopathy: Secondary | ICD-10-CM | POA: Diagnosis not present

## 2022-03-20 DIAGNOSIS — M6282 Rhabdomyolysis: Secondary | ICD-10-CM | POA: Diagnosis not present

## 2022-03-20 DIAGNOSIS — E875 Hyperkalemia: Secondary | ICD-10-CM | POA: Diagnosis not present

## 2022-03-20 DIAGNOSIS — E876 Hypokalemia: Secondary | ICD-10-CM | POA: Diagnosis not present

## 2022-03-20 DIAGNOSIS — W228XXA Striking against or struck by other objects, initial encounter: Secondary | ICD-10-CM | POA: Diagnosis present

## 2022-03-20 DIAGNOSIS — I639 Cerebral infarction, unspecified: Secondary | ICD-10-CM | POA: Diagnosis not present

## 2022-03-20 DIAGNOSIS — E860 Dehydration: Secondary | ICD-10-CM | POA: Diagnosis present

## 2022-03-20 DIAGNOSIS — R34 Anuria and oliguria: Secondary | ICD-10-CM | POA: Diagnosis present

## 2022-03-20 DIAGNOSIS — Z7151 Drug abuse counseling and surveillance of drug abuser: Secondary | ICD-10-CM

## 2022-03-20 DIAGNOSIS — Y9259 Other trade areas as the place of occurrence of the external cause: Secondary | ICD-10-CM

## 2022-03-20 DIAGNOSIS — T79A12S Traumatic compartment syndrome of left upper extremity, sequela: Secondary | ICD-10-CM | POA: Diagnosis not present

## 2022-03-20 DIAGNOSIS — Z880 Allergy status to penicillin: Secondary | ICD-10-CM

## 2022-03-20 DIAGNOSIS — M62838 Other muscle spasm: Secondary | ICD-10-CM | POA: Diagnosis not present

## 2022-03-20 DIAGNOSIS — Z23 Encounter for immunization: Secondary | ICD-10-CM | POA: Diagnosis not present

## 2022-03-20 DIAGNOSIS — M79A11 Nontraumatic compartment syndrome of right upper extremity: Secondary | ICD-10-CM | POA: Diagnosis not present

## 2022-03-20 DIAGNOSIS — G936 Cerebral edema: Secondary | ICD-10-CM | POA: Diagnosis not present

## 2022-03-20 DIAGNOSIS — N289 Disorder of kidney and ureter, unspecified: Secondary | ICD-10-CM | POA: Diagnosis not present

## 2022-03-20 DIAGNOSIS — I38 Endocarditis, valve unspecified: Secondary | ICD-10-CM | POA: Diagnosis not present

## 2022-03-20 DIAGNOSIS — F1210 Cannabis abuse, uncomplicated: Secondary | ICD-10-CM | POA: Diagnosis present

## 2022-03-20 DIAGNOSIS — T8189XA Other complications of procedures, not elsewhere classified, initial encounter: Secondary | ICD-10-CM | POA: Diagnosis not present

## 2022-03-20 DIAGNOSIS — E872 Acidosis, unspecified: Secondary | ICD-10-CM | POA: Diagnosis present

## 2022-03-20 DIAGNOSIS — F321 Major depressive disorder, single episode, moderate: Secondary | ICD-10-CM | POA: Diagnosis not present

## 2022-03-20 DIAGNOSIS — I63443 Cerebral infarction due to embolism of bilateral cerebellar arteries: Secondary | ICD-10-CM | POA: Diagnosis present

## 2022-03-20 DIAGNOSIS — M1990 Unspecified osteoarthritis, unspecified site: Secondary | ICD-10-CM | POA: Diagnosis not present

## 2022-03-20 DIAGNOSIS — T79A12A Traumatic compartment syndrome of left upper extremity, initial encounter: Secondary | ICD-10-CM | POA: Diagnosis not present

## 2022-03-20 DIAGNOSIS — D62 Acute posthemorrhagic anemia: Secondary | ICD-10-CM | POA: Diagnosis not present

## 2022-03-20 DIAGNOSIS — S7402XS Injury of sciatic nerve at hip and thigh level, left leg, sequela: Secondary | ICD-10-CM | POA: Diagnosis not present

## 2022-03-20 DIAGNOSIS — F17210 Nicotine dependence, cigarettes, uncomplicated: Secondary | ICD-10-CM | POA: Diagnosis present

## 2022-03-20 DIAGNOSIS — E785 Hyperlipidemia, unspecified: Secondary | ICD-10-CM | POA: Diagnosis present

## 2022-03-20 DIAGNOSIS — R7401 Elevation of levels of liver transaminase levels: Secondary | ICD-10-CM | POA: Diagnosis present

## 2022-03-20 DIAGNOSIS — S41102A Unspecified open wound of left upper arm, initial encounter: Secondary | ICD-10-CM

## 2022-03-20 DIAGNOSIS — T796XXA Traumatic ischemia of muscle, initial encounter: Secondary | ICD-10-CM | POA: Diagnosis not present

## 2022-03-20 DIAGNOSIS — F1010 Alcohol abuse, uncomplicated: Secondary | ICD-10-CM | POA: Diagnosis present

## 2022-03-20 DIAGNOSIS — R7989 Other specified abnormal findings of blood chemistry: Secondary | ICD-10-CM | POA: Diagnosis not present

## 2022-03-20 DIAGNOSIS — M21372 Foot drop, left foot: Secondary | ICD-10-CM | POA: Diagnosis not present

## 2022-03-20 DIAGNOSIS — L03114 Cellulitis of left upper limb: Secondary | ICD-10-CM | POA: Diagnosis present

## 2022-03-20 DIAGNOSIS — K7200 Acute and subacute hepatic failure without coma: Secondary | ICD-10-CM | POA: Diagnosis not present

## 2022-03-20 DIAGNOSIS — Z22322 Carrier or suspected carrier of Methicillin resistant Staphylococcus aureus: Secondary | ICD-10-CM

## 2022-03-20 DIAGNOSIS — T796XXS Traumatic ischemia of muscle, sequela: Secondary | ICD-10-CM | POA: Diagnosis not present

## 2022-03-20 DIAGNOSIS — G928 Other toxic encephalopathy: Secondary | ICD-10-CM | POA: Diagnosis not present

## 2022-03-20 DIAGNOSIS — E871 Hypo-osmolality and hyponatremia: Secondary | ICD-10-CM | POA: Diagnosis present

## 2022-03-20 DIAGNOSIS — I10 Essential (primary) hypertension: Secondary | ICD-10-CM | POA: Diagnosis present

## 2022-03-20 DIAGNOSIS — R197 Diarrhea, unspecified: Secondary | ICD-10-CM | POA: Diagnosis not present

## 2022-03-20 DIAGNOSIS — F1410 Cocaine abuse, uncomplicated: Secondary | ICD-10-CM | POA: Diagnosis present

## 2022-03-20 DIAGNOSIS — Z992 Dependence on renal dialysis: Secondary | ICD-10-CM | POA: Diagnosis not present

## 2022-03-20 DIAGNOSIS — N179 Acute kidney failure, unspecified: Secondary | ICD-10-CM | POA: Diagnosis not present

## 2022-03-20 DIAGNOSIS — D751 Secondary polycythemia: Secondary | ICD-10-CM | POA: Diagnosis present

## 2022-03-20 DIAGNOSIS — Z8249 Family history of ischemic heart disease and other diseases of the circulatory system: Secondary | ICD-10-CM

## 2022-03-20 DIAGNOSIS — F1910 Other psychoactive substance abuse, uncomplicated: Secondary | ICD-10-CM | POA: Diagnosis not present

## 2022-03-20 DIAGNOSIS — I69398 Other sequelae of cerebral infarction: Secondary | ICD-10-CM | POA: Diagnosis not present

## 2022-03-20 DIAGNOSIS — F411 Generalized anxiety disorder: Secondary | ICD-10-CM | POA: Diagnosis not present

## 2022-03-20 DIAGNOSIS — S61001A Unspecified open wound of right thumb without damage to nail, initial encounter: Secondary | ICD-10-CM | POA: Insufficient documentation

## 2022-03-20 DIAGNOSIS — F1510 Other stimulant abuse, uncomplicated: Secondary | ICD-10-CM | POA: Diagnosis present

## 2022-03-20 DIAGNOSIS — D6489 Other specified anemias: Secondary | ICD-10-CM | POA: Diagnosis present

## 2022-03-20 DIAGNOSIS — R4182 Altered mental status, unspecified: Secondary | ICD-10-CM | POA: Diagnosis not present

## 2022-03-20 DIAGNOSIS — R739 Hyperglycemia, unspecified: Secondary | ICD-10-CM | POA: Diagnosis present

## 2022-03-20 DIAGNOSIS — N5082 Scrotal pain: Secondary | ICD-10-CM | POA: Diagnosis not present

## 2022-03-20 DIAGNOSIS — R000 Tachycardia, unspecified: Secondary | ICD-10-CM | POA: Diagnosis not present

## 2022-03-20 DIAGNOSIS — G9340 Encephalopathy, unspecified: Secondary | ICD-10-CM | POA: Diagnosis not present

## 2022-03-20 DIAGNOSIS — S0003XA Contusion of scalp, initial encounter: Secondary | ICD-10-CM | POA: Diagnosis present

## 2022-03-20 DIAGNOSIS — Z79899 Other long term (current) drug therapy: Secondary | ICD-10-CM

## 2022-03-20 DIAGNOSIS — D696 Thrombocytopenia, unspecified: Secondary | ICD-10-CM | POA: Diagnosis not present

## 2022-03-20 DIAGNOSIS — E669 Obesity, unspecified: Secondary | ICD-10-CM | POA: Diagnosis not present

## 2022-03-20 DIAGNOSIS — N433 Hydrocele, unspecified: Secondary | ICD-10-CM | POA: Diagnosis not present

## 2022-03-20 HISTORY — DX: Chronic kidney disease, unspecified: N18.9

## 2022-03-20 HISTORY — PX: FASCIOTOMY: SHX132

## 2022-03-20 HISTORY — DX: Other psychoactive substance abuse, uncomplicated: F19.10

## 2022-03-20 LAB — COMPREHENSIVE METABOLIC PANEL
ALT: 868 U/L — ABNORMAL HIGH (ref 0–44)
ALT: 708 U/L — ABNORMAL HIGH (ref 0–44)
AST: 2028 U/L — ABNORMAL HIGH (ref 15–41)
AST: 1648 U/L — ABNORMAL HIGH (ref 15–41)
Albumin: 4.2 g/dL (ref 3.5–5.0)
Albumin: 2.6 g/dL — ABNORMAL LOW (ref 3.5–5.0)
Alkaline Phosphatase: 97 U/L (ref 38–126)
Alkaline Phosphatase: 63 U/L (ref 38–126)
Anion gap: 18 — ABNORMAL HIGH (ref 5–15)
Anion gap: 14 (ref 5–15)
BUN: 30 mg/dL — ABNORMAL HIGH (ref 6–20)
BUN: 30 mg/dL — ABNORMAL HIGH (ref 6–20)
CO2: 18 mmol/L — ABNORMAL LOW (ref 22–32)
CO2: 20 mmol/L — ABNORMAL LOW (ref 22–32)
Calcium: 7.7 mg/dL — ABNORMAL LOW (ref 8.9–10.3)
Calcium: 6.7 mg/dL — ABNORMAL LOW (ref 8.9–10.3)
Chloride: 104 mmol/L (ref 98–111)
Chloride: 106 mmol/L (ref 98–111)
Creatinine, Ser: 3.81 mg/dL — ABNORMAL HIGH (ref 0.61–1.24)
Creatinine, Ser: 4.55 mg/dL — ABNORMAL HIGH (ref 0.61–1.24)
GFR, Estimated: 19 mL/min — ABNORMAL LOW (ref >60)
GFR, Estimated: 16 mL/min — ABNORMAL LOW (ref >60)
Glucose, Bld: 135 mg/dL — ABNORMAL HIGH (ref 70–99)
Glucose, Bld: 154 mg/dL — ABNORMAL HIGH (ref 70–99)
Potassium: 6 mmol/L — ABNORMAL HIGH (ref 3.5–5.1)
Potassium: 4.2 mmol/L (ref 3.5–5.1)
Sodium: 140 mmol/L (ref 135–145)
Sodium: 140 mmol/L (ref 135–145)
Total Bilirubin: 1.4 mg/dL — ABNORMAL HIGH (ref 0.3–1.2)
Total Bilirubin: 0.7 mg/dL (ref 0.3–1.2)
Total Protein: 7.9 g/dL (ref 6.5–8.1)
Total Protein: 5.3 g/dL — ABNORMAL LOW (ref 6.5–8.1)

## 2022-03-20 LAB — URINALYSIS, ROUTINE W REFLEX MICROSCOPIC
Bacteria, UA: NONE SEEN
Bilirubin Urine: NEGATIVE
Glucose, UA: NEGATIVE mg/dL
Ketones, ur: NEGATIVE mg/dL
Leukocytes,Ua: NEGATIVE
Nitrite: NEGATIVE
Protein, ur: 100 mg/dL — AB
Specific Gravity, Urine: 1.013 (ref 1.005–1.030)
pH: 6 (ref 5.0–8.0)

## 2022-03-20 LAB — ACETAMINOPHEN LEVEL: Acetaminophen (Tylenol), Serum: <10 ug/mL — ABNORMAL LOW (ref 10–30)

## 2022-03-20 LAB — SEDIMENTATION RATE: Sed Rate: 2 mm/hr (ref 0–16)

## 2022-03-20 LAB — I-STAT CHEM 8, ED
BUN: 35 mg/dL — ABNORMAL HIGH (ref 6–20)
Calcium, Ion: 0.84 mmol/L — CL (ref 1.15–1.40)
Chloride: 105 mmol/L (ref 98–111)
Creatinine, Ser: 4 mg/dL — ABNORMAL HIGH (ref 0.61–1.24)
Glucose, Bld: 144 mg/dL — ABNORMAL HIGH (ref 70–99)
HCT: 62 % — ABNORMAL HIGH (ref 39.0–52.0)
Hemoglobin: 21.1 g/dL (ref 13.0–17.0)
Potassium: 5.9 mmol/L — ABNORMAL HIGH (ref 3.5–5.1)
Sodium: 139 mmol/L (ref 135–145)
TCO2: 22 mmol/L (ref 22–32)

## 2022-03-20 LAB — SAMPLE TO BLOOD BANK

## 2022-03-20 LAB — RAPID URINE DRUG SCREEN, HOSP PERFORMED
Amphetamines: POSITIVE — AB
Barbiturates: NOT DETECTED
Benzodiazepines: NOT DETECTED
Cocaine: NOT DETECTED
Opiates: NOT DETECTED
Tetrahydrocannabinol: NOT DETECTED

## 2022-03-20 LAB — CBC
HCT: 60.5 % — ABNORMAL HIGH (ref 39.0–52.0)
Hemoglobin: 21.1 g/dL (ref 13.0–17.0)
MCH: 32.2 pg (ref 26.0–34.0)
MCHC: 34.9 g/dL (ref 30.0–36.0)
MCV: 92.2 fL (ref 80.0–100.0)
Platelets: 232 10*3/uL (ref 150–400)
RBC: 6.56 MIL/uL — ABNORMAL HIGH (ref 4.22–5.81)
RDW: 14.4 % (ref 11.5–15.5)
WBC: 25.4 10*3/uL — ABNORMAL HIGH (ref 4.0–10.5)
nRBC: 0 % (ref 0.0–0.2)

## 2022-03-20 LAB — CK
Total CK: UNDETERMINED U/L (ref 49–397)
Total CK: >50000 U/L — ABNORMAL HIGH (ref 49–397)
Total CK: >50000 U/L — ABNORMAL HIGH (ref 49–397)

## 2022-03-20 LAB — CBG MONITORING, ED
Glucose-Capillary: 192 mg/dL — ABNORMAL HIGH (ref 70–99)
Glucose-Capillary: 180 mg/dL — ABNORMAL HIGH (ref 70–99)

## 2022-03-20 LAB — PROTIME-INR
INR: 1 (ref 0.8–1.2)
Prothrombin Time: 13.6 seconds (ref 11.4–15.2)

## 2022-03-20 LAB — ETHANOL: Alcohol, Ethyl (B): <10 mg/dL (ref <10)

## 2022-03-20 LAB — LACTIC ACID, PLASMA: Lactic Acid, Venous: 4.6 mmol/L (ref 0.5–1.9)

## 2022-03-20 LAB — C-REACTIVE PROTEIN: CRP: 11.9 mg/dL — ABNORMAL HIGH (ref <1.0)

## 2022-03-20 SURGERY — FASCIOTOMY, UPPER EXTREMITY
Anesthesia: General | Site: Arm Lower | Laterality: Left

## 2022-03-20 MED ORDER — SODIUM CHLORIDE 0.9 % IV SOLN
2.0000 g | Freq: Once | INTRAVENOUS | Status: AC
  Administered 2022-03-20: 2 g via INTRAVENOUS
  Filled 2022-03-20: qty 12.5

## 2022-03-20 MED ORDER — PROPOFOL 10 MG/ML IV BOLUS
INTRAVENOUS | Status: DC | PRN
  Administered 2022-03-20: 170 mg via INTRAVENOUS

## 2022-03-20 MED ORDER — ALBUMIN HUMAN 5 % IV SOLN: INTRAVENOUS | Status: DC | PRN

## 2022-03-20 MED ORDER — SUGAMMADEX SODIUM 200 MG/2ML IV SOLN
INTRAVENOUS | Status: DC | PRN
  Administered 2022-03-20 (×2): 100 mg via INTRAVENOUS

## 2022-03-20 MED ORDER — SODIUM BICARBONATE 8.4 % IV SOLN
Freq: Once | INTRAVENOUS | Status: AC
  Filled 2022-03-20: qty 1000

## 2022-03-20 MED ORDER — CEFAZOLIN SODIUM-DEXTROSE 2-4 GM/100ML-% IV SOLN
2.0000 g | Freq: Once | INTRAVENOUS | Status: AC
  Administered 2022-03-20: 2 g via INTRAVENOUS
  Filled 2022-03-20: qty 100

## 2022-03-20 MED ORDER — DEXMEDETOMIDINE HCL IN NACL 80 MCG/20ML IV SOLN
INTRAVENOUS | Status: DC | PRN
  Administered 2022-03-20: 8 ug via BUCCAL
  Administered 2022-03-20: 4 ug via BUCCAL
  Administered 2022-03-20 (×3): 12 ug via BUCCAL

## 2022-03-20 MED ORDER — SODIUM CHLORIDE 0.9 % IR SOLN
Status: DC | PRN
  Administered 2022-03-20: 3000 mL

## 2022-03-20 MED ORDER — THIAMINE HCL 100 MG/ML IJ SOLN
100.0000 mg | Freq: Every day | INTRAMUSCULAR | Status: DC
  Administered 2022-03-21 – 2022-03-23 (×3): 100 mg via INTRAVENOUS
  Filled 2022-03-20 (×3): qty 2

## 2022-03-20 MED ORDER — PROPOFOL 10 MG/ML IV BOLUS
INTRAVENOUS | Status: AC
  Filled 2022-03-20: qty 20

## 2022-03-20 MED ORDER — MIDAZOLAM HCL 2 MG/2ML IJ SOLN
INTRAMUSCULAR | Status: DC | PRN
  Administered 2022-03-20: 2 mg via INTRAVENOUS

## 2022-03-20 MED ORDER — PROMETHAZINE HCL 25 MG/ML IJ SOLN: 6.2500 mg | INTRAMUSCULAR | Status: DC | PRN

## 2022-03-20 MED ORDER — THIAMINE MONONITRATE 100 MG PO TABS
100.0000 mg | ORAL_TABLET | Freq: Every day | ORAL | Status: DC
  Administered 2022-03-24: 100 mg via ORAL
  Filled 2022-03-20: qty 1

## 2022-03-20 MED ORDER — PHENYLEPHRINE 80 MCG/ML (10ML) SYRINGE FOR IV PUSH (FOR BLOOD PRESSURE SUPPORT)
PREFILLED_SYRINGE | INTRAVENOUS | Status: DC | PRN
  Administered 2022-03-20: 200 ug via INTRAVENOUS
  Administered 2022-03-20 (×2): 160 ug via INTRAVENOUS
  Administered 2022-03-20: 200 ug via INTRAVENOUS
  Administered 2022-03-20: 160 ug via INTRAVENOUS

## 2022-03-20 MED ORDER — ACETAMINOPHEN 325 MG PO TABS: 325.0000 mg | ORAL_TABLET | Freq: Once | ORAL | Status: DC | PRN

## 2022-03-20 MED ORDER — MIDAZOLAM HCL 2 MG/2ML IJ SOLN
INTRAMUSCULAR | Status: AC
  Filled 2022-03-20: qty 2

## 2022-03-20 MED ORDER — IOHEXOL 300 MG/ML  SOLN
100.0000 mL | Freq: Once | INTRAMUSCULAR | Status: AC | PRN
  Administered 2022-03-20: 100 mL via INTRAVENOUS

## 2022-03-20 MED ORDER — DEXAMETHASONE SODIUM PHOSPHATE 10 MG/ML IJ SOLN
INTRAMUSCULAR | Status: AC
  Filled 2022-03-20: qty 1

## 2022-03-20 MED ORDER — SODIUM CHLORIDE 0.9 % IV SOLN: INTRAVENOUS | Status: DC

## 2022-03-20 MED ORDER — TETANUS-DIPHTH-ACELL PERTUSSIS 5-2.5-18.5 LF-MCG/0.5 IM SUSY
0.5000 mL | PREFILLED_SYRINGE | Freq: Once | INTRAMUSCULAR | Status: AC
  Administered 2022-03-20: 0.5 mL via INTRAMUSCULAR
  Filled 2022-03-20: qty 0.5

## 2022-03-20 MED ORDER — LACTATED RINGERS IV SOLN: INTRAVENOUS | Status: DC

## 2022-03-20 MED ORDER — VANCOMYCIN HCL 2000 MG/400ML IV SOLN
2000.0000 mg | Freq: Once | INTRAVENOUS | Status: AC
  Administered 2022-03-20: 2000 mg via INTRAVENOUS
  Filled 2022-03-20: qty 400

## 2022-03-20 MED ORDER — IOHEXOL 350 MG/ML SOLN
75.0000 mL | Freq: Once | INTRAVENOUS | Status: AC | PRN
  Administered 2022-03-20: 75 mL via INTRAVENOUS

## 2022-03-20 MED ORDER — INSULIN ASPART 100 UNIT/ML IV SOLN
10.0000 [IU] | Freq: Once | INTRAVENOUS | Status: AC
  Administered 2022-03-20: 10 [IU] via INTRAVENOUS

## 2022-03-20 MED ORDER — DEXAMETHASONE SODIUM PHOSPHATE 10 MG/ML IJ SOLN
INTRAMUSCULAR | Status: DC | PRN
  Administered 2022-03-20: 10 mg via INTRAVENOUS

## 2022-03-20 MED ORDER — SODIUM CHLORIDE 0.9 % IV BOLUS
1000.0000 mL | Freq: Once | INTRAVENOUS | Status: AC
  Administered 2022-03-20: 1000 mL via INTRAVENOUS

## 2022-03-20 MED ORDER — VANCOMYCIN HCL IN DEXTROSE 1-5 GM/200ML-% IV SOLN
1000.0000 mg | Freq: Once | INTRAVENOUS | Status: DC
  Filled 2022-03-20: qty 200

## 2022-03-20 MED ORDER — PHENYLEPHRINE 80 MCG/ML (10ML) SYRINGE FOR IV PUSH (FOR BLOOD PRESSURE SUPPORT)
PREFILLED_SYRINGE | INTRAVENOUS | Status: AC
  Filled 2022-03-20: qty 10

## 2022-03-20 MED ORDER — AMISULPRIDE (ANTIEMETIC) 5 MG/2ML IV SOLN: 10.0000 mg | Freq: Once | INTRAVENOUS | Status: DC | PRN

## 2022-03-20 MED ORDER — LORAZEPAM 1 MG PO TABS: 1.0000 mg | ORAL_TABLET | ORAL | Status: DC | PRN

## 2022-03-20 MED ORDER — SUCCINYLCHOLINE CHLORIDE 200 MG/10ML IV SOSY
PREFILLED_SYRINGE | INTRAVENOUS | Status: AC
  Filled 2022-03-20: qty 10

## 2022-03-20 MED ORDER — INSULIN ASPART 100 UNIT/ML IJ SOLN
0.0000 [IU] | INTRAMUSCULAR | Status: DC
  Administered 2022-03-21 (×2): 2 [IU] via SUBCUTANEOUS
  Administered 2022-03-21 – 2022-03-22 (×5): 1 [IU] via SUBCUTANEOUS
  Administered 2022-03-22: 2 [IU] via SUBCUTANEOUS
  Administered 2022-03-22 – 2022-03-27 (×7): 1 [IU] via SUBCUTANEOUS
  Administered 2022-03-28: 2 [IU] via SUBCUTANEOUS
  Administered 2022-03-29 – 2022-04-05 (×10): 1 [IU] via SUBCUTANEOUS
  Administered 2022-04-06 – 2022-04-07 (×4): 2 [IU] via SUBCUTANEOUS
  Administered 2022-04-08 (×2): 1 [IU] via SUBCUTANEOUS

## 2022-03-20 MED ORDER — ROCURONIUM BROMIDE 10 MG/ML (PF) SYRINGE
PREFILLED_SYRINGE | INTRAVENOUS | Status: AC
  Filled 2022-03-20: qty 10

## 2022-03-20 MED ORDER — HYDROMORPHONE HCL 1 MG/ML IJ SOLN: 0.2500 mg | INTRAMUSCULAR | Status: DC | PRN

## 2022-03-20 MED ORDER — PHENYLEPHRINE HCL-NACL 20-0.9 MG/250ML-% IV SOLN
INTRAVENOUS | Status: DC | PRN
  Administered 2022-03-20: 60 ug/min via INTRAVENOUS

## 2022-03-20 MED ORDER — FOLIC ACID 1 MG PO TABS
1.0000 mg | ORAL_TABLET | Freq: Every day | ORAL | Status: DC
  Filled 2022-03-20: qty 1

## 2022-03-20 MED ORDER — 0.9 % SODIUM CHLORIDE (POUR BTL) OPTIME
TOPICAL | Status: DC | PRN
  Administered 2022-03-20: 1000 mL

## 2022-03-20 MED ORDER — FENTANYL CITRATE (PF) 250 MCG/5ML IJ SOLN
INTRAMUSCULAR | Status: DC | PRN
  Administered 2022-03-20: 100 ug via INTRAVENOUS

## 2022-03-20 MED ORDER — VANCOMYCIN VARIABLE DOSE PER UNSTABLE RENAL FUNCTION (PHARMACIST DOSING): Status: DC

## 2022-03-20 MED ORDER — DEXTROSE 50 % IV SOLN
1.0000 | Freq: Once | INTRAVENOUS | Status: AC
  Administered 2022-03-20: 50 mL via INTRAVENOUS
  Filled 2022-03-20: qty 50

## 2022-03-20 MED ORDER — POLYETHYLENE GLYCOL 3350 17 G PO PACK
17.0000 g | PACK | Freq: Every day | ORAL | Status: DC | PRN
  Administered 2022-04-01 – 2022-04-03 (×2): 17 g via ORAL
  Filled 2022-03-20 (×2): qty 1

## 2022-03-20 MED ORDER — SUCCINYLCHOLINE CHLORIDE 200 MG/10ML IV SOSY
PREFILLED_SYRINGE | INTRAVENOUS | Status: DC | PRN
  Administered 2022-03-20: 140 mg via INTRAVENOUS

## 2022-03-20 MED ORDER — ADULT MULTIVITAMIN W/MINERALS CH
1.0000 | ORAL_TABLET | Freq: Every day | ORAL | Status: DC
  Administered 2022-03-23 – 2022-04-08 (×15): 1 via ORAL
  Filled 2022-03-20 (×17): qty 1

## 2022-03-20 MED ORDER — SODIUM ZIRCONIUM CYCLOSILICATE 10 G PO PACK: 10.0000 g | PACK | Freq: Once | ORAL | Status: DC

## 2022-03-20 MED ORDER — LACTATED RINGERS IV SOLN: INTRAVENOUS | Status: DC | PRN

## 2022-03-20 MED ORDER — ARTIFICIAL TEARS OPHTHALMIC OINT
TOPICAL_OINTMENT | OPHTHALMIC | Status: AC
  Filled 2022-03-20: qty 3.5

## 2022-03-20 MED ORDER — ACETAMINOPHEN 160 MG/5ML PO SOLN: 325.0000 mg | Freq: Once | ORAL | Status: DC | PRN

## 2022-03-20 MED ORDER — ONDANSETRON HCL 4 MG/2ML IJ SOLN
INTRAMUSCULAR | Status: AC
  Filled 2022-03-20: qty 2

## 2022-03-20 MED ORDER — ROCURONIUM BROMIDE 10 MG/ML (PF) SYRINGE
PREFILLED_SYRINGE | INTRAVENOUS | Status: DC | PRN
  Administered 2022-03-20: 50 mg via INTRAVENOUS
  Administered 2022-03-20: 30 mg via INTRAVENOUS

## 2022-03-20 MED ORDER — HEPARIN SODIUM (PORCINE) 5000 UNIT/ML IJ SOLN
5000.0000 [IU] | Freq: Three times a day (TID) | INTRAMUSCULAR | Status: DC
  Administered 2022-03-21 – 2022-04-08 (×47): 5000 [IU] via SUBCUTANEOUS
  Filled 2022-03-20 (×49): qty 1

## 2022-03-20 MED ORDER — DEXMEDETOMIDINE HCL IN NACL 80 MCG/20ML IV SOLN
INTRAVENOUS | Status: AC
  Filled 2022-03-20: qty 20

## 2022-03-20 MED ORDER — LORAZEPAM 2 MG/ML IJ SOLN: 1.0000 mg | INTRAMUSCULAR | Status: DC | PRN

## 2022-03-20 MED ORDER — SODIUM CHLORIDE 0.9 % IV SOLN: 2.0000 g | INTRAVENOUS | Status: DC

## 2022-03-20 MED ORDER — FENTANYL CITRATE (PF) 250 MCG/5ML IJ SOLN
INTRAMUSCULAR | Status: AC
  Filled 2022-03-20: qty 5

## 2022-03-20 MED ORDER — LIDOCAINE 2% (20 MG/ML) 5 ML SYRINGE
INTRAMUSCULAR | Status: AC
  Filled 2022-03-20: qty 5

## 2022-03-20 MED ORDER — DOCUSATE SODIUM 100 MG PO CAPS
100.0000 mg | ORAL_CAPSULE | Freq: Two times a day (BID) | ORAL | Status: DC | PRN
  Administered 2022-03-26: 100 mg via ORAL
  Filled 2022-03-20 (×2): qty 1

## 2022-03-20 MED ORDER — LIDOCAINE 2% (20 MG/ML) 5 ML SYRINGE
INTRAMUSCULAR | Status: DC | PRN
  Administered 2022-03-20: 40 mg via INTRAVENOUS

## 2022-03-20 MED ORDER — CALCIUM GLUCONATE-NACL 1-0.675 GM/50ML-% IV SOLN: 1.0000 g | Freq: Once | INTRAVENOUS | Status: DC

## 2022-03-20 MED ORDER — ONDANSETRON HCL 4 MG/2ML IJ SOLN
INTRAMUSCULAR | Status: DC | PRN
  Administered 2022-03-20: 4 mg via INTRAVENOUS

## 2022-03-20 MED ORDER — ACETAMINOPHEN 10 MG/ML IV SOLN: 1000.0000 mg | Freq: Once | INTRAVENOUS | Status: DC | PRN

## 2022-03-20 MED ORDER — METRONIDAZOLE 500 MG/100ML IV SOLN
500.0000 mg | Freq: Once | INTRAVENOUS | Status: AC
  Administered 2022-03-20: 500 mg via INTRAVENOUS
  Filled 2022-03-20: qty 100

## 2022-03-20 SURGICAL SUPPLY — 57 items
BAG COUNTER SPONGE SURGICOUNT (BAG) ×1 IMPLANT
BAG SPNG CNTER NS LX DISP (BAG) ×1
BNDG CMPR 9X4 STRL LF SNTH (GAUZE/BANDAGES/DRESSINGS) ×1
BNDG CMPR STD VLCR NS LF 5.8X4 (GAUZE/BANDAGES/DRESSINGS) ×1
BNDG ELASTIC 3X5.8 VLCR STR LF (GAUZE/BANDAGES/DRESSINGS) ×1 IMPLANT
BNDG ELASTIC 4X5.8 VLCR NS LF (GAUZE/BANDAGES/DRESSINGS) IMPLANT
BNDG ELASTIC 4X5.8 VLCR STR LF (GAUZE/BANDAGES/DRESSINGS) ×1 IMPLANT
BNDG ESMARK 4X9 LF (GAUZE/BANDAGES/DRESSINGS) ×1 IMPLANT
BNDG GAUZE DERMACEA FLUFF 4 (GAUZE/BANDAGES/DRESSINGS) ×1 IMPLANT
BNDG GZE DERMACEA 4 6PLY (GAUZE/BANDAGES/DRESSINGS) ×3
CORD BIPOLAR FORCEPS 12FT (ELECTRODE) ×1 IMPLANT
COVER SURGICAL LIGHT HANDLE (MISCELLANEOUS) ×1 IMPLANT
CUFF TOURN SGL QUICK 18X4 (TOURNIQUET CUFF) ×1 IMPLANT
CUFF TOURN SGL QUICK 24 (TOURNIQUET CUFF)
CUFF TRNQT CYL 24X4X16.5-23 (TOURNIQUET CUFF) IMPLANT
DRAPE SURG 17X23 STRL (DRAPES) ×1 IMPLANT
DURAPREP 26ML APPLICATOR (WOUND CARE) ×1 IMPLANT
GAUZE PAD ABD 8X10 STRL (GAUZE/BANDAGES/DRESSINGS) ×2 IMPLANT
GAUZE SPONGE 4X4 12PLY STRL (GAUZE/BANDAGES/DRESSINGS) ×1 IMPLANT
GAUZE XEROFORM 1X8 LF (GAUZE/BANDAGES/DRESSINGS) ×1 IMPLANT
GAUZE XEROFORM 5X9 LF (GAUZE/BANDAGES/DRESSINGS) IMPLANT
GLOVE BIO SURGEON STRL SZ7.5 (GLOVE) ×2 IMPLANT
GLOVE BIOGEL PI IND STRL 7.0 (GLOVE) IMPLANT
GLOVE BIOGEL PI IND STRL 8 (GLOVE) ×1 IMPLANT
GLOVE SURG SS PI 7.0 STRL IVOR (GLOVE) IMPLANT
GOWN STRL REUS W/ TWL LRG LVL3 (GOWN DISPOSABLE) ×2 IMPLANT
GOWN STRL REUS W/TWL LRG LVL3 (GOWN DISPOSABLE) ×2
KIT BASIN OR (CUSTOM PROCEDURE TRAY) ×1 IMPLANT
KIT TURNOVER KIT B (KITS) ×1 IMPLANT
LOOP VASCLR MAXI BLUE 18IN ST (MISCELLANEOUS) IMPLANT
LOOP VASCULAR MAXI 18 BLUE (MISCELLANEOUS) ×2
LOOPS VASCLR MAXI BLUE 18IN ST (MISCELLANEOUS) ×2 IMPLANT
NDL HYPO 25GX1X1/2 BEV (NEEDLE) IMPLANT
NEEDLE HYPO 25GX1X1/2 BEV (NEEDLE) IMPLANT
NS IRRIG 1000ML POUR BTL (IV SOLUTION) ×1 IMPLANT
PACK ORTHO EXTREMITY (CUSTOM PROCEDURE TRAY) ×1 IMPLANT
PAD ARMBOARD 7.5X6 YLW CONV (MISCELLANEOUS) ×2 IMPLANT
PAD CAST 4YDX4 CTTN HI CHSV (CAST SUPPLIES) IMPLANT
PADDING CAST ABS COTTON 4X4 ST (CAST SUPPLIES) ×1 IMPLANT
PADDING CAST COTTON 4X4 STRL (CAST SUPPLIES) ×2
SET CYSTO W/LG BORE CLAMP LF (SET/KITS/TRAYS/PACK) IMPLANT
SLING ARM FOAM STRAP LRG (SOFTGOODS) IMPLANT
SPLINT PLASTER CAST XFAST 3X15 (CAST SUPPLIES) IMPLANT
SPONGE T-LAP 4X18 ~~LOC~~+RFID (SPONGE) ×1 IMPLANT
STAPLER VISISTAT 35W (STAPLE) IMPLANT
SUCTION FRAZIER HANDLE 10FR (MISCELLANEOUS)
SUCTION TUBE FRAZIER 10FR DISP (MISCELLANEOUS) IMPLANT
SUT ETHILON 3 0 PS 1 (SUTURE) ×1 IMPLANT
SUT VIC AB 3-0 SH 18 (SUTURE) ×1 IMPLANT
SYR CONTROL 10ML LL (SYRINGE) IMPLANT
TOWEL GREEN STERILE (TOWEL DISPOSABLE) ×1 IMPLANT
TOWEL GREEN STERILE FF (TOWEL DISPOSABLE) ×1 IMPLANT
TUBE CONNECTING 12X1/4 (SUCTIONS) IMPLANT
TUBE FEEDING ENTERAL 5FR 16IN (TUBING) IMPLANT
UNDERPAD 30X36 HEAVY ABSORB (UNDERPADS AND DIAPERS) ×1 IMPLANT
VASCULAR TIE MAXI BLUE 18IN ST (MISCELLANEOUS) ×2
WATER STERILE IRR 1000ML POUR (IV SOLUTION) ×1 IMPLANT
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
## 2022-03-20 NOTE — Anesthesia Procedure Notes (Signed)
Procedure Name: Intubation Date/Time: 03/20/2022 9:40 PM  Performed by: Reece Agar, CRNAPre-anesthesia Checklist: Patient identified, Emergency Drugs available, Suction available and Patient being monitored Patient Re-evaluated:Patient Re-evaluated prior to induction Oxygen Delivery Method: Circle System Utilized Preoxygenation: Pre-oxygenation with 100% oxygen Induction Type: IV induction, Rapid sequence and Cricoid Pressure applied Laryngoscope Size: Mac and 4 Grade View: Grade I Tube type: Oral Tube size: 7.5 mm Number of attempts: 1 Airway Equipment and Method: Stylet Placement Confirmation: ETT inserted through vocal cords under direct vision, positive ETCO2 and breath sounds checked- equal and bilateral Secured at: 23 cm Tube secured with: Tape Dental Injury: Teeth and Oropharynx as per pre-operative assessment

## 2022-03-20 NOTE — ED Notes (Signed)
PROVIDER AT BEDSIDE AND HAS ASSESSED THE PATIENT

## 2022-03-20 NOTE — Transfer of Care (Addendum)
Immediate Anesthesia Transfer of Care Note  Patient: [employer]  Procedure(s) Performed: FASCIOTOMY LEFT FOREARM, CARPAL TUNNEL RELEASE LEFT HAND (Left: Arm Lower)  Patient Location: PACU  Anesthesia Type:General  Level of Consciousness: awake and drowsy  Airway & Oxygen Therapy: Patient Spontanous Breathing  Post-op Assessment: Report given to RN and Post -op Vital signs reviewed and stable  Post vital signs: Reviewed and stable  Last Vitals:  Vitals Value Taken Time  BP 135/38 03/20/22 2349  Temp    Pulse 93 03/20/22 2353  Resp 18 03/20/22 2353  SpO2 96 % 03/20/22 2353  Vitals shown include unvalidated device data.  Last Pain:  Vitals:   03/20/22 1554  TempSrc: Oral         Complications: No notable events documented.
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
## 2022-03-20 NOTE — ED Notes (Signed)
MD in room

## 2022-03-20 NOTE — Consult Note (Signed)
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 or dislocation in the pelvis. There is no acute fracture or traumatic malalignment of the lumbar spine. IMPRESSION: 1. No evidence of acute traumatic injury in the chest, abdomen, or pelvis. 2. Small amount of debris in left lower lobe airways may reflect aspiration. Opacity in the dependent left lower lobe is favored to reflect atelectasis, though superimposed infection is difficult to exclude. 3. Absence of excreted contrast into the collecting systems on the delayed images. Correlate with renal function. Electronically Signed   By: Valetta Mole M.D.   On: 03/20/2022 16:16      Blood pressure (!) 185/128, pulse (!) 138, temperature 100.3 F (37.9 C), resp. rate (!) 23, weight 99 kg, SpO2 98 %.  General appearance: appears stated age.  Visibly in pain and squirming around on stretcher.  Minimally responsive to questions. Head: Normocephalic, without obvious abnormality, atraumatic Neck: supple, symmetrical, trachea midline Extremities: left forearm tense.   Capillary refill in fingers.  Does not follow commands to move digits.  Upper arm soft compartments.  Pain with passive motion of fingers Pulses: left radial pulse weak Skin: Skin color, texture normal. No rashes or lesions Neurologic: unable to assess Incision/Wound: none  Assessment/Plan Left forearm compartment syndrome.  Recommend OR for emergent fasciotomies left forearm.  Risks, benefits and alternatives of surgery were discussed including risks of blood loss, infection, damage to nerves/vessels/tendons/ligament/bone, failure of surgery, need for additional surgery, complication with wound healing, stiffness, need for repeat irrigation and debridement, loss of arm.   His family members voiced understanding of these risks and elected to proceed.    Leanora Cover 03/20/2022, 8:58 PM
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
## 2022-03-20 NOTE — Sepsis Progress Note (Signed)
Following per sepsis protocol   

## 2022-03-20 NOTE — ED Notes (Signed)
Date and time results received: 03/20/22 4:00 PM (use smartphrase ".now" to insert current time)  Test: Hgb  Critical Value: 21.1  Name of Provider Notified: Dr. Gilford Raid  Orders Received? Or Actions Taken?:  No new orders at this time

## 2022-03-20 NOTE — Anesthesia Preprocedure Evaluation (Addendum)
Anesthesia Evaluation  Patient identified by MRN, date of birth, ID band Patient unresponsive  General Assessment Comment:Pt acutely intoxicated.   Reviewed: Allergy & Precautions, Patient's Chart, lab work & pertinent test results, Unable to perform ROS - Chart review onlyPreop documentation limited or incomplete due to emergent nature of procedure.  Airway Mallampati: Unable to assess       Dental  (+) Dental Advisory Given   Pulmonary Current Smoker and Patient abstained from smoking.    + decreased breath sounds      Cardiovascular negative cardio ROS  Rhythm:Regular Rate:Tachycardia     Neuro/Psych  Neuromuscular disease  negative psych ROS   GI/Hepatic negative GI ROS, Neg liver ROS,,,  Endo/Other  negative endocrine ROS    Renal/GU negative Renal ROS     Musculoskeletal  (+) Arthritis ,    Abdominal   Peds  Hematology negative hematology ROS (+)   Anesthesia Other Findings   Reproductive/Obstetrics                             Anesthesia Physical Anesthesia Plan  ASA: 2 and emergent  Anesthesia Plan: General   Post-op Pain Management: Dilaudid IV   Induction: Intravenous, Rapid sequence and Cricoid pressure planned  PONV Risk Score and Plan: 2 and Ondansetron, Dexamethasone and Midazolam  Airway Management Planned: Oral ETT  Additional Equipment: None  Intra-op Plan:   Post-operative Plan: Extubation in OR  Informed Consent: I have reviewed the patients History and Physical, chart, labs and discussed the procedure including the risks, benefits and alternatives for the proposed anesthesia with the patient or authorized representative who has indicated his/her understanding and acceptance.       Plan Discussed with: CRNA  Anesthesia Plan Comments:        Anesthesia Quick Evaluation
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
## 2022-03-20 NOTE — ED Notes (Signed)
Not in waiting area x 1

## 2022-03-20 NOTE — ED Triage Notes (Signed)
[facility] BIB [facility], for left arm cellulitis. Liver enzymes and lactic  elevated. Marland Kitchen
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
## 2022-03-20 NOTE — Progress Notes (Signed)
Pharmacy Antibiotic Note  [name] [name] is a 41 y.o. male admitted on 03/20/2022 with sepsis.  Pharmacy has been consulted for cefepime and vancomycin dosing.Patient afebrile, tachycardia, with significant AKI Scr 3.81.   Plan: Start cefepime 2g every 24 hours.  Will give 1x 2000mg  vancomycin load, then dose based on levels due to current AKI.  Follow culture data for de-escalation.  Monitor renal function for dose adjustments as indicated.   Temp (24hrs), Avg:98.4 F (36.9 C), Min:98.3 F (36.8 C), Max:98.5 F (36.9 C)  Recent Labs  Lab 03/20/22 1533 03/20/22 1632  WBC 25.4*  --   CREATININE 3.81*  4.00*  --   LATICACIDVEN  --  4.6*    CrCl cannot be calculated (Unknown ideal weight.).    No Known Allergies  Thank you for allowing pharmacy to be a part of this patient's care.  Esmeralda Arthur, PharmD, BCCCP  03/20/2022 7:33 PM

## 2022-03-20 NOTE — ED Notes (Signed)
PROVIDER AWARE OF HR AND PATIENT LOC
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
## 2022-03-21 ENCOUNTER — Inpatient Hospital Stay (HOSPITAL_COMMUNITY): Payer: Medicaid Other

## 2022-03-21 ENCOUNTER — Encounter (HOSPITAL_COMMUNITY): Payer: Self-pay | Admitting: Orthopedic Surgery

## 2022-03-21 DIAGNOSIS — I38 Endocarditis, valve unspecified: Secondary | ICD-10-CM

## 2022-03-21 DIAGNOSIS — E872 Acidosis, unspecified: Secondary | ICD-10-CM

## 2022-03-21 DIAGNOSIS — I639 Cerebral infarction, unspecified: Secondary | ICD-10-CM

## 2022-03-21 DIAGNOSIS — T79A12A Traumatic compartment syndrome of left upper extremity, initial encounter: Secondary | ICD-10-CM

## 2022-03-21 DIAGNOSIS — G9340 Encephalopathy, unspecified: Secondary | ICD-10-CM

## 2022-03-21 DIAGNOSIS — N179 Acute kidney failure, unspecified: Secondary | ICD-10-CM | POA: Diagnosis not present

## 2022-03-21 LAB — GLUCOSE, CAPILLARY
Glucose-Capillary: 133 mg/dL — ABNORMAL HIGH (ref 70–99)
Glucose-Capillary: 140 mg/dL — ABNORMAL HIGH (ref 70–99)
Glucose-Capillary: 147 mg/dL — ABNORMAL HIGH (ref 70–99)
Glucose-Capillary: 148 mg/dL — ABNORMAL HIGH (ref 70–99)
Glucose-Capillary: 153 mg/dL — ABNORMAL HIGH (ref 70–99)
Glucose-Capillary: 155 mg/dL — ABNORMAL HIGH (ref 70–99)
Glucose-Capillary: 178 mg/dL — ABNORMAL HIGH (ref 70–99)

## 2022-03-21 LAB — CBC
HCT: 42.4 % (ref 39.0–52.0)
Hemoglobin: 15.4 g/dL (ref 13.0–17.0)
MCH: 33.3 pg (ref 26.0–34.0)
MCHC: 36.3 g/dL — ABNORMAL HIGH (ref 30.0–36.0)
MCV: 91.8 fL (ref 80.0–100.0)
Platelets: 98 10*3/uL — ABNORMAL LOW (ref 150–400)
RBC: 4.62 MIL/uL (ref 4.22–5.81)
RDW: 13.2 % (ref 11.5–15.5)
WBC: 18.5 10*3/uL — ABNORMAL HIGH (ref 4.0–10.5)
nRBC: 0 % (ref 0.0–0.2)

## 2022-03-21 LAB — ECHOCARDIOGRAM COMPLETE
AR max vel: 2.98 cm2
AV Area VTI: 2.81 cm2
AV Area mean vel: 2.79 cm2
AV Mean grad: 3 mmHg
AV Peak grad: 6.5 mmHg
Ao pk vel: 1.27 m/s
S' Lateral: 3.1 cm
Weight: 2962.98 oz

## 2022-03-21 LAB — RENAL FUNCTION PANEL
Albumin: 2.3 g/dL — ABNORMAL LOW (ref 3.5–5.0)
Anion gap: 11 (ref 5–15)
BUN: 40 mg/dL — ABNORMAL HIGH (ref 6–20)
CO2: 20 mmol/L — ABNORMAL LOW (ref 22–32)
Calcium: 6.6 mg/dL — ABNORMAL LOW (ref 8.9–10.3)
Chloride: 108 mmol/L (ref 98–111)
Creatinine, Ser: 6.47 mg/dL — ABNORMAL HIGH (ref 0.61–1.24)
GFR, Estimated: 10 mL/min — ABNORMAL LOW (ref >60)
Glucose, Bld: 157 mg/dL — ABNORMAL HIGH (ref 70–99)
Phosphorus: 5.2 mg/dL — ABNORMAL HIGH (ref 2.5–4.6)
Potassium: 4.5 mmol/L (ref 3.5–5.1)
Sodium: 139 mmol/L (ref 135–145)

## 2022-03-21 LAB — PHOSPHORUS: Phosphorus: 4.8 mg/dL — ABNORMAL HIGH (ref 2.5–4.6)

## 2022-03-21 LAB — COMPREHENSIVE METABOLIC PANEL
ALT: 514 U/L — ABNORMAL HIGH (ref 0–44)
AST: 1240 U/L — ABNORMAL HIGH (ref 15–41)
Albumin: 2.7 g/dL — ABNORMAL LOW (ref 3.5–5.0)
Alkaline Phosphatase: 50 U/L (ref 38–126)
Anion gap: 12 (ref 5–15)
BUN: 33 mg/dL — ABNORMAL HIGH (ref 6–20)
CO2: 16 mmol/L — ABNORMAL LOW (ref 22–32)
Calcium: 5.9 mg/dL — CL (ref 8.9–10.3)
Chloride: 110 mmol/L (ref 98–111)
Creatinine, Ser: 4.97 mg/dL — ABNORMAL HIGH (ref 0.61–1.24)
GFR, Estimated: 14 mL/min — ABNORMAL LOW (ref >60)
Glucose, Bld: 158 mg/dL — ABNORMAL HIGH (ref 70–99)
Potassium: 5.7 mmol/L — ABNORMAL HIGH (ref 3.5–5.1)
Sodium: 138 mmol/L (ref 135–145)
Total Bilirubin: 0.9 mg/dL (ref 0.3–1.2)
Total Protein: 4.8 g/dL — ABNORMAL LOW (ref 6.5–8.1)

## 2022-03-21 LAB — MAGNESIUM: Magnesium: 1.8 mg/dL (ref 1.7–2.4)

## 2022-03-21 LAB — POTASSIUM
Potassium: 5.2 mmol/L — ABNORMAL HIGH (ref 3.5–5.1)
Potassium: 5 mmol/L (ref 3.5–5.1)
Potassium: 4.1 mmol/L (ref 3.5–5.1)

## 2022-03-21 LAB — SURGICAL PCR SCREEN
MRSA, PCR: POSITIVE — AB
Staphylococcus aureus: POSITIVE — AB

## 2022-03-21 LAB — CK: Total CK: >50000 U/L — ABNORMAL HIGH (ref 49–397)

## 2022-03-21 LAB — LACTIC ACID, PLASMA: Lactic Acid, Venous: 1.6 mmol/L (ref 0.5–1.9)

## 2022-03-21 MED ORDER — HYDROCODONE-ACETAMINOPHEN 7.5-325 MG PO TABS
1.0000 | ORAL_TABLET | ORAL | Status: DC | PRN
  Administered 2022-03-21 – 2022-03-24 (×11): 2 via ORAL
  Administered 2022-03-25: 1 via ORAL
  Administered 2022-03-26 – 2022-04-01 (×19): 2 via ORAL
  Filled 2022-03-21 (×8): qty 2
  Filled 2022-03-21: qty 1
  Filled 2022-03-21 (×24): qty 2

## 2022-03-21 MED ORDER — HYDROCODONE-ACETAMINOPHEN 5-325 MG PO TABS
1.0000 | ORAL_TABLET | ORAL | Status: DC | PRN
  Administered 2022-03-23 – 2022-03-24 (×4): 2 via ORAL
  Administered 2022-03-25: 1 via ORAL
  Administered 2022-03-25 – 2022-04-03 (×15): 2 via ORAL
  Administered 2022-04-03 – 2022-04-04 (×2): 1 via ORAL
  Administered 2022-04-04 – 2022-04-08 (×15): 2 via ORAL
  Filled 2022-03-21 (×12): qty 2
  Filled 2022-03-21: qty 1
  Filled 2022-03-21 (×6): qty 2
  Filled 2022-03-21: qty 1
  Filled 2022-03-21 (×21): qty 2

## 2022-03-21 MED ORDER — KETAMINE HCL 50 MG/5ML IJ SOSY: 1.0000 mg/kg | PREFILLED_SYRINGE | Freq: Once | INTRAMUSCULAR | Status: AC

## 2022-03-21 MED ORDER — STERILE WATER FOR INJECTION IV SOLN
INTRAVENOUS | Status: DC
  Filled 2022-03-21 (×10): qty 150

## 2022-03-21 MED ORDER — DEXTROSE 50 % IV SOLN
1.0000 | Freq: Once | INTRAVENOUS | Status: AC
  Administered 2022-03-21: 50 mL via INTRAVENOUS
  Filled 2022-03-21: qty 50

## 2022-03-21 MED ORDER — ACETAMINOPHEN 500 MG PO TABS: 500.0000 mg | ORAL_TABLET | Freq: Four times a day (QID) | ORAL | Status: DC

## 2022-03-21 MED ORDER — ACETAMINOPHEN 650 MG RE SUPP: 650.0000 mg | Freq: Four times a day (QID) | RECTAL | Status: DC | PRN

## 2022-03-21 MED ORDER — PRISMASOL BGK 0/2.5 32-2.5 MEQ/L EC SOLN
Status: DC
  Filled 2022-03-21 (×6): qty 5000

## 2022-03-21 MED ORDER — FOLIC ACID 5 MG/ML IJ SOLN
1.0000 mg | Freq: Every day | INTRAMUSCULAR | Status: DC
  Administered 2022-03-21 – 2022-03-24 (×4): 1 mg via INTRAVENOUS
  Filled 2022-03-21 (×6): qty 0.2

## 2022-03-21 MED ORDER — CHLORHEXIDINE GLUCONATE CLOTH 2 % EX PADS
6.0000 | MEDICATED_PAD | Freq: Every morning | CUTANEOUS | Status: DC
  Administered 2022-03-21 – 2022-04-08 (×16): 6 via TOPICAL

## 2022-03-21 MED ORDER — STROKE: EARLY STAGES OF RECOVERY BOOK
Freq: Once | Status: AC
  Filled 2022-03-21: qty 1

## 2022-03-21 MED ORDER — HEPARIN SODIUM (PORCINE) 1000 UNIT/ML DIALYSIS
1000.0000 [IU] | INTRAMUSCULAR | Status: DC | PRN
  Administered 2022-03-22: 2400 [IU] via INTRAVENOUS_CENTRAL
  Filled 2022-03-21: qty 6
  Filled 2022-03-21: qty 4
  Filled 2022-03-21: qty 6

## 2022-03-21 MED ORDER — FENTANYL CITRATE PF 50 MCG/ML IJ SOSY
12.5000 ug | PREFILLED_SYRINGE | INTRAMUSCULAR | Status: DC | PRN
  Administered 2022-03-21 – 2022-03-22 (×9): 12.5 ug via INTRAVENOUS
  Filled 2022-03-21 (×10): qty 1

## 2022-03-21 MED ORDER — HEPARIN SODIUM (PORCINE) 1000 UNIT/ML IJ SOLN
INTRAMUSCULAR | Status: AC
  Administered 2022-03-21: 3000 [IU] via INTRAVENOUS_CENTRAL
  Filled 2022-03-21: qty 3

## 2022-03-21 MED ORDER — PHENOBARBITAL SODIUM 65 MG/ML IJ SOLN
32.5000 mg | Freq: Three times a day (TID) | INTRAMUSCULAR | Status: AC
  Administered 2022-03-23 – 2022-03-25 (×6): 32.5 mg via INTRAVENOUS
  Filled 2022-03-21 (×6): qty 1

## 2022-03-21 MED ORDER — SODIUM ZIRCONIUM CYCLOSILICATE 10 G PO PACK
10.0000 g | PACK | Freq: Once | ORAL | Status: DC
  Filled 2022-03-21: qty 1

## 2022-03-21 MED ORDER — SODIUM CHLORIDE 0.9 % IV SOLN
2.0000 g | Freq: Two times a day (BID) | INTRAVENOUS | Status: DC
  Administered 2022-03-21 – 2022-03-23 (×4): 2 g via INTRAVENOUS
  Filled 2022-03-21 (×4): qty 12.5

## 2022-03-21 MED ORDER — SODIUM CHLORIDE 0.9 % IV SOLN: INTRAVENOUS | Status: DC | PRN

## 2022-03-21 MED ORDER — PRISMASOL BGK 0/2.5 32-2.5 MEQ/L EC SOLN
Status: DC
  Filled 2022-03-21 (×22): qty 5000

## 2022-03-21 MED ORDER — KETAMINE HCL 50 MG/5ML IJ SOSY
PREFILLED_SYRINGE | INTRAMUSCULAR | Status: AC
  Filled 2022-03-21: qty 5

## 2022-03-21 MED ORDER — KETAMINE HCL 50 MG/5ML IJ SOSY
PREFILLED_SYRINGE | INTRAMUSCULAR | Status: AC
  Administered 2022-03-21: 84 mg via INTRAVENOUS
  Filled 2022-03-21: qty 5

## 2022-03-21 MED ORDER — PHENOBARBITAL SODIUM 65 MG/ML IJ SOLN
65.0000 mg | Freq: Three times a day (TID) | INTRAMUSCULAR | Status: AC
  Administered 2022-03-21 – 2022-03-23 (×6): 65 mg via INTRAVENOUS
  Filled 2022-03-21 (×6): qty 1

## 2022-03-21 MED ORDER — CALCIUM GLUCONATE-NACL 2-0.675 GM/100ML-% IV SOLN
2.0000 g | Freq: Once | INTRAVENOUS | Status: AC
  Administered 2022-03-21: 2000 mg via INTRAVENOUS
  Filled 2022-03-21: qty 100

## 2022-03-21 MED ORDER — CHLORHEXIDINE GLUCONATE CLOTH 2 % EX PADS
6.0000 | MEDICATED_PAD | Freq: Once | CUTANEOUS | Status: AC
  Administered 2022-03-21: 6 via TOPICAL

## 2022-03-21 MED ORDER — ACETAMINOPHEN 325 MG PO TABS
650.0000 mg | ORAL_TABLET | Freq: Four times a day (QID) | ORAL | Status: DC | PRN
  Administered 2022-03-26 – 2022-04-06 (×4): 650 mg via ORAL
  Filled 2022-03-21 (×5): qty 2

## 2022-03-21 MED ORDER — ACETAMINOPHEN 325 MG PO TABS: 325.0000 mg | ORAL_TABLET | Freq: Four times a day (QID) | ORAL | Status: DC | PRN

## 2022-03-21 MED ORDER — MUPIROCIN 2 % EX OINT
1.0000 | TOPICAL_OINTMENT | Freq: Two times a day (BID) | CUTANEOUS | Status: AC
  Administered 2022-03-21 – 2022-03-25 (×10): 1 via NASAL
  Filled 2022-03-21 (×2): qty 22

## 2022-03-21 MED ORDER — ORAL CARE MOUTH RINSE
15.0000 mL | OROMUCOSAL | Status: DC
  Administered 2022-03-21 – 2022-03-22 (×4): 15 mL via OROMUCOSAL

## 2022-03-21 MED ORDER — MORPHINE SULFATE (PF) 2 MG/ML IV SOLN
0.5000 mg | INTRAVENOUS | Status: DC | PRN
  Administered 2022-03-21: 0.5 mg via INTRAVENOUS
  Administered 2022-03-21: 1 mg via INTRAVENOUS
  Filled 2022-03-21 (×2): qty 1

## 2022-03-21 MED ORDER — INSULIN ASPART 100 UNIT/ML IV SOLN
5.0000 [IU] | Freq: Once | INTRAVENOUS | Status: AC
  Administered 2022-03-21: 5 [IU] via INTRAVENOUS

## 2022-03-21 MED ORDER — VANCOMYCIN HCL IN DEXTROSE 1-5 GM/200ML-% IV SOLN
1000.0000 mg | INTRAVENOUS | Status: DC
  Administered 2022-03-21 – 2022-03-22 (×2): 1000 mg via INTRAVENOUS
  Filled 2022-03-21 (×2): qty 200

## 2022-03-21 MED ORDER — SODIUM BICARBONATE 8.4 % IV SOLN
INTRAVENOUS | Status: DC
  Filled 2022-03-21 (×2): qty 1000

## 2022-03-21 MED ORDER — HYDRALAZINE HCL 20 MG/ML IJ SOLN
20.0000 mg | Freq: Four times a day (QID) | INTRAMUSCULAR | Status: DC | PRN
  Administered 2022-03-21 – 2022-03-26 (×2): 20 mg via INTRAVENOUS
  Filled 2022-03-21 (×4): qty 1

## 2022-03-21 MED ORDER — ORAL CARE MOUTH RINSE: 15.0000 mL | OROMUCOSAL | Status: DC | PRN
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
## 2022-03-21 NOTE — Procedures (Addendum)
Central Venous Catheter Insertion Procedure Note  Denarius Sesler  [id number]  07-Jan-1981  Date:03/21/22  Time:12:32 PM   Provider Performing:Andruw Melony Overly, Salvadore Dom, ACNP  Procedure: Insertion of Non-tunneled Central Venous Catheter(36556)with US guidance (24401)    Indication(s) Hemodialysis  Consent Unable to obtain consent due to emergent nature of procedure.  Anesthesia Topical only with 1% lidocaine   Timeout Verified patient identification, verified procedure, site/side was marked, verified correct patient position, special equipment/implants available, medications/allergies/relevant history reviewed, required imaging and test results available.  Sterile Technique Maximal sterile technique including full sterile barrier drape, hand hygiene, sterile gown, sterile gloves, mask, hair covering, sterile ultrasound probe cover (if used).  Procedure Description Area of catheter insertion was cleaned with chlorhexidine and draped in sterile fashion.   With real-time ultrasound guidance a HD catheter was placed into the right internal jugular vein.  Nonpulsatile blood flow and easy flushing noted in all ports.  The catheter was sutured in place and sterile dressing applied.  Complications/Tolerance None; patient tolerated the procedure well. Chest X-ray is ordered to verify placement for internal jugular or subclavian cannulation.  Chest x-ray is not ordered for femoral cannulation.  EBL Minimal  Specimen(s) None  I was present, scrubbed in and physically assisted with and instructed this procedure.   Erick Colace ACNP-BC [place] Pager # [phone] OR # [phone] if no answer

## 2022-03-21 NOTE — Progress Notes (Signed)
  Echocardiogram 2D Echocardiogram has been performed.  [name] [name] 03/21/2022, 10:45 AM
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
## 2022-03-21 NOTE — Progress Notes (Signed)
eLink Physician-Brief Progress Note Patient Name: [name] DOB: 24-Apr-1980 MRN: [id number]   Date of Service  03/21/2022  HPI/Events of Note  Notified of hypertension with BP 183/101, HR 101, RR 14, O2 sats 97%.  Utox positive for amphetamines.   eICU Interventions  Hydralazine IV PRN ordered.     Intervention Category Intermediate Interventions: Hypertension - evaluation and management  [name] [name] 03/21/2022, 5:36 AM

## 2022-03-21 NOTE — Anesthesia Postprocedure Evaluation (Signed)
Anesthesia Post Note  Patient: [name] [name]  Procedure(s) Performed: FASCIOTOMY LEFT FOREARM, CARPAL TUNNEL RELEASE LEFT HAND (Left: Arm Lower)     Patient location during evaluation: PACU Anesthesia Type: General Level of consciousness: awake and alert Pain management: pain level controlled Vital Signs Assessment: post-procedure vital signs reviewed and stable Respiratory status: spontaneous breathing, nonlabored ventilation, respiratory function stable and patient connected to nasal cannula oxygen Cardiovascular status: blood pressure returned to baseline and stable Postop Assessment: no apparent nausea or vomiting Anesthetic complications: no   No notable events documented.  Last Vitals:  Vitals:   03/21/22 0015 03/21/22 0032  BP: (!) 156/85 (!) 152/97  Pulse: (!) 101 (!) 102  Resp: 15 16  Temp:  37.3 C  SpO2: 96% 97%    Last Pain:  Vitals:   03/21/22 0032  TempSrc: Bladder                 Effie Berkshire
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
## 2022-03-21 NOTE — Progress Notes (Addendum)
eLink Physician-Brief Progress Note Patient Name: [name] [name] DOB: [birth year] MRN: [id number]   Date of Service  03/21/2022  HPI/Events of Note  Notified of calcium 5.9, corrected for low albumin is 6.9,  K 5.7, crea 4.97. Lactic acid 1.6 <-- 4.6.  MRSA (+)   eICU Interventions  Replete calcium.  Give insulin and D50 and another dose of lokelma.  Continue antibiotics.     Intervention Category Intermediate Interventions: Other:;Infection - evaluation and management  Elsie Lincoln 03/21/2022, 3:59 AM  4:52 AM Pt is not following commands and RN concerned pt cannot safely take PO medications.   Plan> Hold lokelma.  Repeat K.
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
## 2022-03-21 NOTE — Progress Notes (Addendum)
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  GLUCAP 148* 140* 147*    SSI  Rhabdomyolysis, left upper extremity compartment syndrome and acute renal failure Patient found down with left upper extremity compartment syndrome, now status post fasciotomy CK greater than 50,000 Will start CRRT today Management per primary team  Other Stroke Risk Factors Substance abuse - UDS: Positive for amphetamines THC NONE DETECTED, Cocaine NONE DETECTED. Patient advised to stop using due to stroke risk.  Other Active Problems None  Hospital day # [place] , MSN, AGACNP-BC [facility] See Amion for schedule and pager information 03/21/2022 3:00 PM   I have personally obtained history,examined this patient, reviewed notes, independently viewed imaging studies, participated in medical  decision making and plan of care.ROS completed by me personally and pertinent positives fully documented  I have made any additions or clarifications directly to the above note. Agree with note above.  Patient was found to have altered mental status and lying for prolonged period of time his left side with compartment syndrome and CT scan showing bilateral cerebellar white matter  hypodensities likely related to toxic metabolic encephalopathy in the setting of rhabdomyolysis renal failure and amphetamine use.  Bilateral cerebellar infarcts with nonocclusive vertebrobasilar disease in a young person would be unlikely.  Patient cannot have an MRI due to metal around his orbits.  Recommend repeat CT scan in 24 hours.  Continue ongoing medical management of rhabdomyolysis, renal failure as per critical care team.  No family available at the bedside for discussion. This patient is critically ill and at significant risk of neurological worsening, death and care requires constant monitoring of vital signs, hemodynamics,respiratory and cardiac monitoring, extensive review of multiple databases, frequent neurological assessment, discussion with family, other specialists and medical decision making of high complexity.I have made any additions or clarifications directly to the above note.This critical care time does not reflect procedure time, or teaching time or supervisory time of PA/NP/Med Resident etc but could involve care discussion time.  I spent 35 minutes of neurocritical care time  in the care of  this patient.      [name] Contras, MD Medical Director [facility] Pager: [phone] 03/21/2022 5:37 PM   To contact Stroke Continuity provider, please refer to [email or web address] After hours, contact General Neurology

## 2022-03-21 NOTE — Progress Notes (Signed)
Inpatient Rehab Admissions Coordinator:   Per PT patient was screened for CIR candidacy by Clemens Catholic, MS, CCC-SLP. At this time, Pt. is not yet at a level to tolerate the intensity of CIR; however,   Pt. may have potential to progress to becoming a potential CIR candidate, so CIR admissions team will follow and monitor for progress and participation with therapies and place consult order if Pt. appears to be an appropriate candidate. Please contact me with any questions.    Clemens Catholic, [place], [place] Admissions Coordinator  [phone] ([place]) [phone] (office)
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
## 2022-03-21 NOTE — Progress Notes (Signed)
  Transition of Care [facility]) Screening Note   Patient Details  Name: [name] [name] Date of Birth: February 25, 1981   Transition of [facility] CM/SW Contact:    Benard Halsted, LCSW Phone Number: 03/21/2022, 10:12 AM    Transition of Care Department [facility]) has reviewed patient and he is currently unable to participate in SA assessment at this time. We will continue to monitor patient advancement through interdisciplinary progression rounds. If new patient transition needs arise, please place a TOC consult.

## 2022-03-21 NOTE — Progress Notes (Signed)
Subjective: 1 Day Post-Op Procedure(s) (LRB): FASCIOTOMY LEFT FOREARM, CARPAL TUNNEL RELEASE LEFT HAND (Left) Patient sleeping.  Does not wake to voice.  Objective: Vital signs in last 24 hours: Temp:  [97.9 F (36.6 C)-100.6 F (38.1 C)] 98.4 F (36.9 C) (12/22 1200) Pulse Rate:  [92-145] 114 (12/22 1200) Resp:  [13-32] 14 (12/22 1200) BP: (98-187)/(38-132) 146/83 (12/22 1200) SpO2:  [88 %-99 %] 95 % (12/22 1200) Weight:  [84 kg-99 kg] 84 kg (12/22 0200)  Intake/Output from previous day: 12/21 0701 - 12/22 0700 In: 2652.8 [I.V.:1935.5; IV Piggyback:717.3] Out: 535 [Urine:510; Blood:25] Intake/Output this shift: Total I/O In: 302.6 [I.V.:302.6] Out: 20 [Urine:20]  Recent Labs    03/20/22 1533 03/21/22 0240  HGB 21.1*  21.1* 15.4   Recent Labs    03/20/22 1533 03/21/22 0240  WBC 25.4* 18.5*  RBC 6.56* 4.62  HCT 60.5*  62.0* 42.4  PLT 232 98*   Recent Labs    03/20/22 1920 03/21/22 0240 03/21/22 0537 03/21/22 1154  NA 140 138  --   --   K 4.2 5.7* 5.2* 5.0  CL 106 110  --   --   CO2 20* 16*  --   --   BUN 30* 33*  --   --   CREATININE 4.55* 4.97*  --   --   GLUCOSE 154* 158*  --   --   CALCIUM 6.7* 5.9*  --   --    Recent Labs    03/20/22 1533  INR 1.0    Brisk capillary refill all digits.  Dressing clean dry intact   Assessment/Plan: 1 Day Post-Op Procedure(s) (LRB): FASCIOTOMY LEFT FOREARM, CARPAL TUNNEL RELEASE LEFT HAND (Left) S/p fasciotomies.  Muscle looked viable intraoperatively.  Possible return to OR for examination and reduction wound size.  Elevate left arm to level of heart.    [name] [name] 03/21/2022, 1:36 PM

## 2022-03-21 NOTE — Progress Notes (Signed)
Pharmacy Antibiotic Note  [name] [name] is a 41 y.o. male admitted on 03/20/2022 with sepsis.  Pharmacy has been consulted for cefepime and vancomycin dosing - day #2. Patient loaded with vancomycin and given cefepime initial dose 12/21.   Noted AKI - patient to start on CRRT today.  Plan: Once CRRT initiated: Change cefepime to 2g IV q12h Continue vancomycin 1g IV q24h Monitor clinical progress, c/s, abx plan/LOT Vancomycin levels as indicated F/u Nephrology plans, CRRT off-time as appropriate   Temp (24hrs), Avg:99.3 F (37.4 C), Min:97.9 F (36.6 C), Max:100.6 F (38.1 C)  Recent Labs  Lab 03/20/22 1533 03/20/22 1632 03/20/22 1920 03/21/22 0240  WBC 25.4*  --   --  18.5*  CREATININE 3.81*  4.00*  --  4.55* 4.97*  LATICACIDVEN  --  4.6*  --  1.6     Estimated Creatinine Clearance: 20.3 mL/min (A) (by C-G formula based on SCr of 4.97 mg/dL (H)).    Allergies  Allergen Reactions   Penicillins Swelling and Rash    Arturo Morton, PharmD, BCPS Please check AMION for all [place] contact numbers Clinical Pharmacist 03/21/2022 2:31 PM

## 2022-03-22 DIAGNOSIS — G9341 Metabolic encephalopathy: Secondary | ICD-10-CM

## 2022-03-22 DIAGNOSIS — T796XXA Traumatic ischemia of muscle, initial encounter: Secondary | ICD-10-CM | POA: Diagnosis not present

## 2022-03-22 DIAGNOSIS — N179 Acute kidney failure, unspecified: Secondary | ICD-10-CM

## 2022-03-22 DIAGNOSIS — F1910 Other psychoactive substance abuse, uncomplicated: Secondary | ICD-10-CM | POA: Diagnosis not present

## 2022-03-22 LAB — CBC
HCT: 42.6 % (ref 39.0–52.0)
Hemoglobin: 15.1 g/dL (ref 13.0–17.0)
MCH: 32.8 pg (ref 26.0–34.0)
MCHC: 35.4 g/dL (ref 30.0–36.0)
MCV: 92.6 fL (ref 80.0–100.0)
Platelets: 133 10*3/uL — ABNORMAL LOW (ref 150–400)
RBC: 4.6 MIL/uL (ref 4.22–5.81)
RDW: 13.5 % (ref 11.5–15.5)
WBC: 24.1 10*3/uL — ABNORMAL HIGH (ref 4.0–10.5)
nRBC: 0 % (ref 0.0–0.2)

## 2022-03-22 LAB — GLUCOSE, CAPILLARY
Glucose-Capillary: 133 mg/dL — ABNORMAL HIGH (ref 70–99)
Glucose-Capillary: 140 mg/dL — ABNORMAL HIGH (ref 70–99)
Glucose-Capillary: 125 mg/dL — ABNORMAL HIGH (ref 70–99)
Glucose-Capillary: 118 mg/dL — ABNORMAL HIGH (ref 70–99)
Glucose-Capillary: 116 mg/dL — ABNORMAL HIGH (ref 70–99)

## 2022-03-22 LAB — RENAL FUNCTION PANEL
Albumin: 2.4 g/dL — ABNORMAL LOW (ref 3.5–5.0)
Albumin: 2.4 g/dL — ABNORMAL LOW (ref 3.5–5.0)
Anion gap: 13 (ref 5–15)
Anion gap: 13 (ref 5–15)
BUN: 30 mg/dL — ABNORMAL HIGH (ref 6–20)
BUN: 22 mg/dL — ABNORMAL HIGH (ref 6–20)
CO2: 25 mmol/L (ref 22–32)
CO2: 27 mmol/L (ref 22–32)
Calcium: 7 mg/dL — ABNORMAL LOW (ref 8.9–10.3)
Calcium: 7 mg/dL — ABNORMAL LOW (ref 8.9–10.3)
Chloride: 100 mmol/L (ref 98–111)
Chloride: 97 mmol/L — ABNORMAL LOW (ref 98–111)
Creatinine, Ser: 5.44 mg/dL — ABNORMAL HIGH (ref 0.61–1.24)
Creatinine, Ser: 4.37 mg/dL — ABNORMAL HIGH (ref 0.61–1.24)
GFR, Estimated: 13 mL/min — ABNORMAL LOW (ref >60)
GFR, Estimated: 17 mL/min — ABNORMAL LOW (ref >60)
Glucose, Bld: 127 mg/dL — ABNORMAL HIGH (ref 70–99)
Glucose, Bld: 105 mg/dL — ABNORMAL HIGH (ref 70–99)
Phosphorus: 5.8 mg/dL — ABNORMAL HIGH (ref 2.5–4.6)
Phosphorus: 5.2 mg/dL — ABNORMAL HIGH (ref 2.5–4.6)
Potassium: 4 mmol/L (ref 3.5–5.1)
Potassium: 3.7 mmol/L (ref 3.5–5.1)
Sodium: 138 mmol/L (ref 135–145)
Sodium: 137 mmol/L (ref 135–145)

## 2022-03-22 LAB — MAGNESIUM: Magnesium: 2 mg/dL (ref 1.7–2.4)

## 2022-03-22 LAB — LIPID PANEL
Cholesterol: 172 mg/dL (ref 0–200)
HDL: 33 mg/dL — ABNORMAL LOW (ref >40)
Total CHOL/HDL Ratio: 5.2 RATIO
Triglycerides: 422 mg/dL — ABNORMAL HIGH (ref <150)
VLDL: UNDETERMINED mg/dL (ref 0–40)

## 2022-03-22 LAB — HEMOGLOBIN A1C
Hgb A1c MFr Bld: 4.6 % — ABNORMAL LOW (ref 4.8–5.6)
Mean Plasma Glucose: 85 mg/dL

## 2022-03-22 LAB — CK
Total CK: >50000 U/L — ABNORMAL HIGH (ref 49–397)
Total CK: 40856 U/L — ABNORMAL HIGH (ref 49–397)

## 2022-03-22 MED ORDER — FENTANYL CITRATE PF 50 MCG/ML IJ SOSY
50.0000 ug | PREFILLED_SYRINGE | INTRAMUSCULAR | Status: DC | PRN
  Administered 2022-03-22 – 2022-03-27 (×11): 50 ug via INTRAVENOUS
  Filled 2022-03-22 (×11): qty 1

## 2022-03-22 MED ORDER — ORAL CARE MOUTH RINSE: 15.0000 mL | OROMUCOSAL | Status: DC | PRN
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
## 2022-03-22 NOTE — Progress Notes (Signed)
E-link MD notified of pt having intermittent episodes of agitation causing CRRT pressures to rise. Agitation appears to be precipitated by pain although this is difficult to assess as pt has poor attention/concentration. MD also notified of UOP dropping to <5cc/hr. Orders to notify MD if agitation episodes continue or become longer in nature.

## 2022-03-22 NOTE — Progress Notes (Signed)
CRRT stopped for filter change d/t increasing filter/TMP pressures. While returning blood, clot was detected in system and required entire set/fluid disposal. Pharmacy notified. New set installed and CRRT resumed at 4709 w/ no complications.
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
## 2022-03-23 ENCOUNTER — Inpatient Hospital Stay (HOSPITAL_COMMUNITY): Payer: Medicaid Other

## 2022-03-23 DIAGNOSIS — N179 Acute kidney failure, unspecified: Secondary | ICD-10-CM | POA: Diagnosis not present

## 2022-03-23 DIAGNOSIS — M6282 Rhabdomyolysis: Secondary | ICD-10-CM

## 2022-03-23 DIAGNOSIS — T796XXA Traumatic ischemia of muscle, initial encounter: Secondary | ICD-10-CM | POA: Diagnosis not present

## 2022-03-23 DIAGNOSIS — R7989 Other specified abnormal findings of blood chemistry: Secondary | ICD-10-CM

## 2022-03-23 DIAGNOSIS — G9341 Metabolic encephalopathy: Secondary | ICD-10-CM | POA: Diagnosis not present

## 2022-03-23 LAB — CBC
HCT: 32.4 % — ABNORMAL LOW (ref 39.0–52.0)
Hemoglobin: 10.9 g/dL — ABNORMAL LOW (ref 13.0–17.0)
MCH: 32.1 pg (ref 26.0–34.0)
MCHC: 33.6 g/dL (ref 30.0–36.0)
MCV: 95.3 fL (ref 80.0–100.0)
Platelets: 103 10*3/uL — ABNORMAL LOW (ref 150–400)
RBC: 3.4 MIL/uL — ABNORMAL LOW (ref 4.22–5.81)
RDW: 13.7 % (ref 11.5–15.5)
WBC: 14.5 10*3/uL — ABNORMAL HIGH (ref 4.0–10.5)
nRBC: 0 % (ref 0.0–0.2)

## 2022-03-23 LAB — RENAL FUNCTION PANEL
Albumin: 2.1 g/dL — ABNORMAL LOW (ref 3.5–5.0)
Albumin: 2.4 g/dL — ABNORMAL LOW (ref 3.5–5.0)
Anion gap: 11 (ref 5–15)
Anion gap: 13 (ref 5–15)
BUN: 23 mg/dL — ABNORMAL HIGH (ref 6–20)
BUN: 23 mg/dL — ABNORMAL HIGH (ref 6–20)
CO2: 29 mmol/L (ref 22–32)
CO2: 26 mmol/L (ref 22–32)
Calcium: 6.9 mg/dL — ABNORMAL LOW (ref 8.9–10.3)
Calcium: 7.6 mg/dL — ABNORMAL LOW (ref 8.9–10.3)
Chloride: 94 mmol/L — ABNORMAL LOW (ref 98–111)
Chloride: 96 mmol/L — ABNORMAL LOW (ref 98–111)
Creatinine, Ser: 4.76 mg/dL — ABNORMAL HIGH (ref 0.61–1.24)
Creatinine, Ser: 3.78 mg/dL — ABNORMAL HIGH (ref 0.61–1.24)
GFR, Estimated: 15 mL/min — ABNORMAL LOW (ref >60)
GFR, Estimated: 20 mL/min — ABNORMAL LOW (ref >60)
Glucose, Bld: 100 mg/dL — ABNORMAL HIGH (ref 70–99)
Glucose, Bld: 101 mg/dL — ABNORMAL HIGH (ref 70–99)
Phosphorus: 4.9 mg/dL — ABNORMAL HIGH (ref 2.5–4.6)
Phosphorus: 3.8 mg/dL (ref 2.5–4.6)
Potassium: 3.2 mmol/L — ABNORMAL LOW (ref 3.5–5.1)
Potassium: 4 mmol/L (ref 3.5–5.1)
Sodium: 134 mmol/L — ABNORMAL LOW (ref 135–145)
Sodium: 135 mmol/L (ref 135–145)

## 2022-03-23 LAB — GLUCOSE, CAPILLARY
Glucose-Capillary: 105 mg/dL — ABNORMAL HIGH (ref 70–99)
Glucose-Capillary: 106 mg/dL — ABNORMAL HIGH (ref 70–99)
Glucose-Capillary: 107 mg/dL — ABNORMAL HIGH (ref 70–99)
Glucose-Capillary: 108 mg/dL — ABNORMAL HIGH (ref 70–99)
Glucose-Capillary: 123 mg/dL — ABNORMAL HIGH (ref 70–99)
Glucose-Capillary: 89 mg/dL (ref 70–99)
Glucose-Capillary: 93 mg/dL (ref 70–99)

## 2022-03-23 LAB — HEPATIC FUNCTION PANEL
ALT: 242 U/L — ABNORMAL HIGH (ref 0–44)
AST: 639 U/L — ABNORMAL HIGH (ref 15–41)
Albumin: 2.1 g/dL — ABNORMAL LOW (ref 3.5–5.0)
Alkaline Phosphatase: 54 U/L (ref 38–126)
Bilirubin, Direct: 0.2 mg/dL (ref 0.0–0.2)
Indirect Bilirubin: 0.8 mg/dL (ref 0.3–0.9)
Total Bilirubin: 1 mg/dL (ref 0.3–1.2)
Total Protein: 4.5 g/dL — ABNORMAL LOW (ref 6.5–8.1)

## 2022-03-23 LAB — CK: Total CK: 28655 U/L — ABNORMAL HIGH (ref 49–397)

## 2022-03-23 LAB — VANCOMYCIN, RANDOM: Vancomycin Rm: 33 ug/mL

## 2022-03-23 LAB — MAGNESIUM: Magnesium: 2.1 mg/dL (ref 1.7–2.4)

## 2022-03-23 LAB — LDL CHOLESTEROL, DIRECT: Direct LDL: 53 mg/dL (ref 0–99)

## 2022-03-23 MED ORDER — PRISMASOL BGK 4/2.5 32-4-2.5 MEQ/L REPLACEMENT SOLN
Status: DC
  Filled 2022-03-23 (×6): qty 5000

## 2022-03-23 MED ORDER — PRISMASOL BGK 4/2.5 32-4-2.5 MEQ/L REPLACEMENT SOLN
Status: DC
  Filled 2022-03-23 (×8): qty 5000

## 2022-03-23 MED ORDER — HEPARIN SODIUM (PORCINE) 1000 UNIT/ML DIALYSIS
1000.0000 [IU] | INTRAMUSCULAR | Status: DC | PRN
  Administered 2022-03-24: 2400 [IU] via INTRAVENOUS_CENTRAL
  Filled 2022-03-23: qty 3
  Filled 2022-03-23: qty 6
  Filled 2022-03-23: qty 3

## 2022-03-23 MED ORDER — PRISMASOL BGK 4/2.5 32-4-2.5 MEQ/L EC SOLN
Status: DC
  Filled 2022-03-23 (×30): qty 5000

## 2022-03-23 MED ORDER — AMLODIPINE BESYLATE 5 MG PO TABS
5.0000 mg | ORAL_TABLET | Freq: Every day | ORAL | Status: DC
  Administered 2022-03-23 – 2022-04-08 (×14): 5 mg via ORAL
  Filled 2022-03-23 (×16): qty 1

## 2022-03-23 MED ORDER — ASPIRIN 81 MG PO TBEC
81.0000 mg | DELAYED_RELEASE_TABLET | Freq: Every day | ORAL | Status: DC
  Administered 2022-03-24 – 2022-04-08 (×14): 81 mg via ORAL
  Filled 2022-03-23 (×15): qty 1
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
## 2022-03-23 NOTE — Progress Notes (Signed)
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  CRRT started 03/21/22             - appreciate PCCM help with catheter             - will do CRRT for 24-48 hrs then see if we can convert to IHD             - converted to 4K today             - Think we need another day or so with the CK   2.  Hyperkalemia             - resolved   3.  Rhabdo             - L hand             - s/p faciotomies  - CK coming down   4.  AMS/ encephalopathy             - had some ? Embolic CVAs             - s/p TTE   5.  Dispo: ICU  Subjective:    CK coming down nicely.  More alert today   Objective:   BP (!) 144/80   Pulse 99   Temp 98.4 F (36.9 C)   Resp (!) 9   Wt 89.5 kg   SpO2 96%   BMI 30.90 kg/m   Intake/Output Summary (Last 24 hours) at 03/23/2022 1246 Last data filed at 03/23/2022 1200 Gross per 24 hour  Intake 1073.11 ml  Output 1559 ml  Net -485.89 ml   Weight change: -3.505 kg  Physical Exam: GEN calm, lying in bed HEENT EOMI PERRL NECK no JVD PULM tachypneic CV tachycardic ABD soft EXT 1+ LE edema NEURO alert and oriented today SKIN no rashes MSK L hand in OR dressing  Imaging: CT HEAD WO CONTRAST (5MM)  Result Date: 03/23/2022 CLINICAL DATA:  Stroke follow-up EXAM: CT HEAD WITHOUT CONTRAST TECHNIQUE: Contiguous axial images were obtained from the base of the skull through the vertex without intravenous contrast. RADIATION DOSE REDUCTION: This exam was performed according to the departmental dose-optimization program which includes automated exposure control, adjustment of the mA and/or kV according to patient size and/or use of iterative reconstruction technique. COMPARISON:  Two days ago FINDINGS: Brain: Unchanged patchy low-density in the bilateral cerebellar hemisphere, likely fixed infarct given continued persistence. No new or progressive infarct, no hemorrhage, hydrocephalus, or collection.  Vascular: No hyperdense vessel or unexpected calcification. Skull: Unremarkable Sinuses/Orbits: BB at the superior right orbit. Nasal septal perforation. No acute finding. IMPRESSION: Bilateral cerebellar edema is non progressed. No new abnormality or hemorrhage. Electronically Signed   By: Jorje Guild M.D.   On: 03/23/2022 06:35   CT HEAD WO CONTRAST (5MM)  Result Date: 03/21/2022 CLINICAL DATA:  Stroke follow-up EXAM: CT HEAD WITHOUT CONTRAST TECHNIQUE: Contiguous axial images were obtained from the base of the skull through the vertex without intravenous contrast. RADIATION DOSE REDUCTION: This exam was performed according to the departmental dose-optimization program which includes automated exposure control, adjustment of the mA and/or kV according to patient size and/or use of iterative reconstruction technique. COMPARISON:  Head CTA from yesterday FINDINGS: Brain: The areas of bilateral cerebellar edema show presumed enhancement from previously administered contrast. No discrete hematoma.No hydrocephalus, shift, or collection Vascular: Recent CTA.  No interval change. Skull:  Negative Sinuses/Orbits: Negative IMPRESSION: 1. Presumed enhancement of the areas of bilateral cerebellar edema seen on prior. No new area of disease or worrisome swelling. 2. Unexpected persistence of intravenous contrast compatible with renal failure. Electronically Signed   By: Jorje Guild M.D.   On: 03/21/2022 16:29   DG CHEST PORT 1 VIEW  Result Date: 03/21/2022 CLINICAL DATA:  Central line insertion. EXAM: PORTABLE CHEST 1 VIEW COMPARISON:  01/05/2022 FINDINGS: 1242 hours. Low volume film. Right IJ central line tip overlies the mid SVC level. No evidence for pneumothorax or pleural effusion. No focal consolidation or pulmonary edema. Cardiopericardial silhouette is at upper limits of normal for size. Telemetry leads overlie the chest. IMPRESSION: Right IJ central line tip overlies the mid SVC level. No evidence  for pneumothorax or pleural effusion. Electronically Signed   By: Misty Stanley M.D.   On: 03/21/2022 13:05    Labs: BMET Recent Labs  Lab 03/20/22 1533 03/20/22 1920 03/21/22 0240 03/21/22 0537 03/21/22 1154 03/21/22 1525 03/21/22 2007 03/22/22 0325 03/22/22 1554 03/23/22 0420  NA 140  139 140 138  --   --  139  --  138 137 134*  K 6.0*  5.9* 4.2 5.7* 5.2* 5.0 4.5 4.1 4.0 3.7 3.2*  CL 104  105 106 110  --   --  108  --  100 97* 94*  CO2 18* 20* 16*  --   --  20*  --  25 27 29   GLUCOSE 135*  144* 154* 158*  --   --  157*  --  127* 105* 100*  BUN 30*  35* 30* 33*  --   --  40*  --  30* 22* 23*  CREATININE 3.81*  4.00* 4.55* 4.97*  --   --  6.47*  --  5.44* 4.37* 4.76*  CALCIUM 7.7* 6.7* 5.9*  --   --  6.6*  --  7.0* 7.0* 6.9*  PHOS  --   --  4.8*  --   --  5.2*  --  5.8* 5.2* 4.9*   CBC Recent Labs  Lab 03/20/22 1533 03/21/22 0240 03/22/22 0315 03/23/22 0418  WBC 25.4* 18.5* 24.1* 14.5*  HGB 21.1*  21.1* 15.4 15.1 10.9*  HCT 60.5*  62.0* 42.4 42.6 32.4*  MCV 92.2 91.8 92.6 95.3  PLT 232 98* 133* 103*    Medications:     amLODipine  5 mg Oral Daily   Chlorhexidine Gluconate Cloth  6 each Topical q morning   folic acid  1 mg Intravenous Daily   heparin  5,000 Units Subcutaneous Q8H   insulin aspart  0-9 Units Subcutaneous Q4H   multivitamin with minerals  1 tablet Oral Daily   mupirocin ointment  1 Application Nasal BID   PHENObarbital  32.5 mg Intravenous Q8H   thiamine  100 mg Oral Daily   Or   thiamine  100 mg Intravenous Daily    Madelon Lips, MD 03/23/2022, 12:46 PM

## 2022-03-24 DIAGNOSIS — N179 Acute kidney failure, unspecified: Secondary | ICD-10-CM | POA: Diagnosis not present

## 2022-03-24 LAB — RENAL FUNCTION PANEL
Albumin: 2.2 g/dL — ABNORMAL LOW (ref 3.5–5.0)
Albumin: 2.2 g/dL — ABNORMAL LOW (ref 3.5–5.0)
Anion gap: 9 (ref 5–15)
Anion gap: 7 (ref 5–15)
BUN: 21 mg/dL — ABNORMAL HIGH (ref 6–20)
BUN: 22 mg/dL — ABNORMAL HIGH (ref 6–20)
CO2: 24 mmol/L (ref 22–32)
CO2: 26 mmol/L (ref 22–32)
Calcium: 7.7 mg/dL — ABNORMAL LOW (ref 8.9–10.3)
Calcium: 7.6 mg/dL — ABNORMAL LOW (ref 8.9–10.3)
Chloride: 100 mmol/L (ref 98–111)
Chloride: 100 mmol/L (ref 98–111)
Creatinine, Ser: 3.17 mg/dL — ABNORMAL HIGH (ref 0.61–1.24)
Creatinine, Ser: 3.03 mg/dL — ABNORMAL HIGH (ref 0.61–1.24)
GFR, Estimated: 24 mL/min — ABNORMAL LOW (ref >60)
GFR, Estimated: 26 mL/min — ABNORMAL LOW (ref >60)
Glucose, Bld: 99 mg/dL (ref 70–99)
Glucose, Bld: 102 mg/dL — ABNORMAL HIGH (ref 70–99)
Phosphorus: 2.6 mg/dL (ref 2.5–4.6)
Phosphorus: 3.1 mg/dL (ref 2.5–4.6)
Potassium: 4 mmol/L (ref 3.5–5.1)
Potassium: 3.8 mmol/L (ref 3.5–5.1)
Sodium: 133 mmol/L — ABNORMAL LOW (ref 135–145)
Sodium: 133 mmol/L — ABNORMAL LOW (ref 135–145)

## 2022-03-24 LAB — GLUCOSE, CAPILLARY
Glucose-Capillary: 94 mg/dL (ref 70–99)
Glucose-Capillary: 95 mg/dL (ref 70–99)
Glucose-Capillary: 89 mg/dL (ref 70–99)
Glucose-Capillary: 105 mg/dL — ABNORMAL HIGH (ref 70–99)
Glucose-Capillary: 113 mg/dL — ABNORMAL HIGH (ref 70–99)

## 2022-03-24 LAB — CBC
HCT: 29.9 % — ABNORMAL LOW (ref 39.0–52.0)
Hemoglobin: 10.6 g/dL — ABNORMAL LOW (ref 13.0–17.0)
MCH: 32.9 pg (ref 26.0–34.0)
MCHC: 35.5 g/dL (ref 30.0–36.0)
MCV: 92.9 fL (ref 80.0–100.0)
Platelets: 113 10*3/uL — ABNORMAL LOW (ref 150–400)
RBC: 3.22 MIL/uL — ABNORMAL LOW (ref 4.22–5.81)
RDW: 13.7 % (ref 11.5–15.5)
WBC: 14.1 10*3/uL — ABNORMAL HIGH (ref 4.0–10.5)
nRBC: 0 % (ref 0.0–0.2)

## 2022-03-24 LAB — MAGNESIUM: Magnesium: 2.3 mg/dL (ref 1.7–2.4)

## 2022-03-24 LAB — CK: Total CK: 19178 U/L — ABNORMAL HIGH (ref 49–397)
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
## 2022-03-24 NOTE — Progress Notes (Signed)
Brief note:  Continues on CRRT CK coming down nicely Made a tiny bit of urine yesterday.  Think we can pause CRRT this evening and consider conversion to IHD tomorrow or the next day.  Madelon Lips MD [employer]

## 2022-03-24 NOTE — Progress Notes (Signed)
1230hrs: Ortho on call notified regarding dressing discharge on the left arm fasciotomy site, as per on call doctor significant amount of serosanguinous discharge is expected on the left arm since there is open incision on the arm. Wound to be re evaluated tomorrow by Dr. Fredna Dow

## 2022-03-25 DIAGNOSIS — M6282 Rhabdomyolysis: Secondary | ICD-10-CM | POA: Diagnosis not present

## 2022-03-25 LAB — GLUCOSE, CAPILLARY
Glucose-Capillary: 113 mg/dL — ABNORMAL HIGH (ref 70–99)
Glucose-Capillary: 117 mg/dL — ABNORMAL HIGH (ref 70–99)
Glucose-Capillary: 134 mg/dL — ABNORMAL HIGH (ref 70–99)
Glucose-Capillary: 94 mg/dL (ref 70–99)
Glucose-Capillary: 97 mg/dL (ref 70–99)
Glucose-Capillary: 99 mg/dL (ref 70–99)

## 2022-03-25 LAB — RENAL FUNCTION PANEL
Albumin: 2.2 g/dL — ABNORMAL LOW (ref 3.5–5.0)
Anion gap: 10 (ref 5–15)
BUN: 34 mg/dL — ABNORMAL HIGH (ref 6–20)
CO2: 22 mmol/L (ref 22–32)
Calcium: 7.6 mg/dL — ABNORMAL LOW (ref 8.9–10.3)
Chloride: 98 mmol/L (ref 98–111)
Creatinine, Ser: 4.5 mg/dL — ABNORMAL HIGH (ref 0.61–1.24)
GFR, Estimated: 16 mL/min — ABNORMAL LOW (ref >60)
Glucose, Bld: 102 mg/dL — ABNORMAL HIGH (ref 70–99)
Phosphorus: 2.9 mg/dL (ref 2.5–4.6)
Potassium: 3.8 mmol/L (ref 3.5–5.1)
Sodium: 130 mmol/L — ABNORMAL LOW (ref 135–145)

## 2022-03-25 LAB — CULTURE, BLOOD (ROUTINE X 2): Culture: NO GROWTH

## 2022-03-25 LAB — MAGNESIUM: Magnesium: 2.4 mg/dL (ref 1.7–2.4)

## 2022-03-25 LAB — HEPATITIS B SURFACE ANTIGEN: Hepatitis B Surface Ag: NONREACTIVE

## 2022-03-25 MED ORDER — PENTAFLUOROPROP-TETRAFLUOROETH EX AERO: 1.0000 | INHALATION_SPRAY | CUTANEOUS | Status: DC | PRN

## 2022-03-25 MED ORDER — ALTEPLASE 2 MG IJ SOLR: 2.0000 mg | Freq: Once | INTRAMUSCULAR | Status: DC | PRN

## 2022-03-25 MED ORDER — SODIUM CHLORIDE 0.9 % IV SOLN: INTRAVENOUS | Status: DC

## 2022-03-25 MED ORDER — LIDOCAINE-PRILOCAINE 2.5-2.5 % EX CREA
1.0000 | TOPICAL_CREAM | CUTANEOUS | Status: DC | PRN
  Filled 2022-03-25: qty 5

## 2022-03-25 MED ORDER — THIAMINE MONONITRATE 100 MG PO TABS
100.0000 mg | ORAL_TABLET | Freq: Every day | ORAL | Status: DC
  Administered 2022-03-25 – 2022-04-08 (×13): 100 mg via ORAL
  Filled 2022-03-25 (×14): qty 1

## 2022-03-25 MED ORDER — ANTICOAGULANT SODIUM CITRATE 4% (200MG/5ML) IV SOLN
5.0000 mL | Status: DC | PRN
  Filled 2022-03-25: qty 5

## 2022-03-25 MED ORDER — CHLORHEXIDINE GLUCONATE CLOTH 2 % EX PADS
6.0000 | MEDICATED_PAD | Freq: Every day | CUTANEOUS | Status: DC
  Administered 2022-03-26 – 2022-04-08 (×13): 6 via TOPICAL

## 2022-03-25 MED ORDER — FOLIC ACID 1 MG PO TABS
1.0000 mg | ORAL_TABLET | Freq: Every day | ORAL | Status: DC
  Administered 2022-03-25 – 2022-04-08 (×13): 1 mg via ORAL
  Filled 2022-03-25 (×13): qty 1

## 2022-03-25 MED ORDER — LIDOCAINE HCL (PF) 1 % IJ SOLN: 5.0000 mL | INTRAMUSCULAR | Status: DC | PRN

## 2022-03-25 MED ORDER — HEPARIN SODIUM (PORCINE) 1000 UNIT/ML DIALYSIS
1000.0000 [IU] | INTRAMUSCULAR | Status: DC | PRN
  Administered 2022-03-26: 1400 [IU]
  Administered 2022-03-30: 2400 [IU]
  Administered 2022-04-02: 3200 [IU]
  Filled 2022-03-25 (×4): qty 1

## 2022-03-25 NOTE — Progress Notes (Signed)
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  CRRT started 03/21/22             - remains ~ anuric; off CRRT since 6pm 12/25.              - will plan for iHD tomorrow  - was placed on IVF this AM - will change to 12h as volume challenge but as remains anuric want to prevent overload  -cont qshift bladder scan      - await renal recovery.   2.  Hyperkalemia             - resolved   3.  Rhabdo             - L hand             - s/p faciotomies  - CK coming down   4.  AMS/ encephalopathy             - had some ? Embolic CVAs             - s/p TTE   5.  Dispo: ICU  Subjective:    Off CRRT since 6pm yes as hemodynamically stable.  Labs this AM acceptable. Plan for OR Thurs for arm debridement.   Placed on MIVF this AM.     Objective:   BP (!) 143/81   Pulse (!) 107   Temp 98.5 F (36.9 C) (Axillary)   Resp (!) 35   Wt 91.2 kg   SpO2 96%   BMI 31.48 kg/m   Intake/Output Summary (Last 24 hours) at 03/25/2022 1405 Last data filed at 03/25/2022 1000 Gross per 24 hour  Intake 501.9 ml  Output 219.4 ml  Net 282.5 ml    Weight change:   Physical Exam: GEN calm, lying in bed HEENT EOMI PERRL NECK no JVD PULM tachypneic CV tachycardic ABD soft EXT 1+ diffuse edema NEURO alert and oriented today  SKIN no rashes MSK L hand in OR dressing  Imaging: No results found.  Labs: BMET Recent Labs  Lab 03/22/22 0325 03/22/22 1554 03/23/22 0420 03/23/22 1617 03/24/22 0353 03/24/22 1617 03/25/22 0539  NA 138 137 134* 135 133* 133* 130*  K 4.0 3.7 3.2* 4.0 4.0 3.8 3.8  CL 100 97* 94* 96* 100 100 98  CO2 25 27 29 26 24 26 22   GLUCOSE 127* 105* 100* 101* 99 102* 102*  BUN 30* 22* 23* 23* 21* 22* 34*  CREATININE 5.44* 4.37* 4.76* 3.78* 3.17* 3.03* 4.50*  CALCIUM 7.0* 7.0* 6.9* 7.6* 7.7* 7.6* 7.6*  PHOS 5.8* 5.2* 4.9* 3.8 2.6 3.1 2.9    CBC Recent Labs  Lab 03/21/22 0240 03/22/22 0315  03/23/22 0418 03/24/22 0353  WBC 18.5* 24.1* 14.5* 14.1*  HGB 15.4 15.1 10.9* 10.6*  HCT 42.4 42.6 32.4* 29.9*  MCV 91.8 92.6 95.3 92.9  PLT 98* 133* 103* 113*     Medications:     amLODipine  5 mg Oral Daily   aspirin EC  81 mg Oral Daily   Chlorhexidine Gluconate Cloth  6 each Topical q morning   folic acid  1 mg Oral Daily   heparin  5,000 Units Subcutaneous Q8H   insulin aspart  0-9 Units Subcutaneous Q4H   multivitamin with minerals  1 tablet Oral Daily   mupirocin ointment  1 Application Nasal BID   thiamine  100 mg Oral Daily  Jannifer Hick MD [facility] Pager [phone]

## 2022-03-25 NOTE — Progress Notes (Signed)
CSW attempted to speak with patient regarding SA use and CAGE; patient sleeping soundly. Will attempt again.  Gilmore Laroche, MSW, [facility]
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
## 2022-03-25 NOTE — Progress Notes (Signed)
Subjective: 5 Days Post-Op Procedure(s) (LRB): FASCIOTOMY LEFT FOREARM, CARPAL TUNNEL RELEASE LEFT HAND (Left) Awake and responsive to questions.  Patient reports pain in left arm.  States dressing does not feel tight.  Objective: Vital signs in last 24 hours: Temp:  [98.1 F (36.7 C)-98.8 F (37.1 C)] 98.5 F (36.9 C) (12/26 1200) Pulse Rate:  [90-107] 107 (12/26 1300) Resp:  [9-35] 35 (12/26 1300) BP: (106-171)/(51-89) 143/81 (12/26 1300) SpO2:  [93 %-100 %] 96 % (12/26 1300)  Intake/Output from previous day: 12/25 0701 - 12/26 0700 In: 477.5 [P.O.:270; I.V.:207.5] Out: 799.2 [Urine:40] Intake/Output this shift: Total I/O In: 364.3 [P.O.:300; I.V.:64.3] Out: 0   Recent Labs    03/23/22 0418 03/24/22 0353  HGB 10.9* 10.6*   Recent Labs    03/23/22 0418 03/24/22 0353  WBC 14.5* 14.1*  RBC 3.40* 3.22*  HCT 32.4* 29.9*  PLT 103* 113*   Recent Labs    03/24/22 1617 03/25/22 0539  NA 133* 130*  K 3.8 3.8  CL 100 98  CO2 26 22  BUN 22* 34*  CREATININE 3.03* 4.50*  GLUCOSE 102* 102*  CALCIUM 7.6* 7.6*   No results for input(s): "LABPT", "INR" in the last 72 hours.  Intact sensation and capillary refill all digits.  Able to wiggle fingers and thumb.  Dressing intact.  Some serous drainage soak through.   Assessment/Plan: 5 Days Post-Op Procedure(s) (LRB): FASCIOTOMY LEFT FOREARM, CARPAL TUNNEL RELEASE LEFT HAND (Left) Plan return to OR on Thursday for repeat irrigation and debridement and closure of wounds as possible.  Will remove any devitalized tissue at that time.  At initial surgery, tissue appeared viable.  Reinforce dressing as necessary.  Hold anticoagulation morning of surgery.   Leanora Cover 03/25/2022, 1:48 PM
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
## 2022-03-26 ENCOUNTER — Other Ambulatory Visit: Payer: Self-pay | Admitting: Orthopedic Surgery

## 2022-03-26 DIAGNOSIS — N179 Acute kidney failure, unspecified: Secondary | ICD-10-CM | POA: Diagnosis not present

## 2022-03-26 LAB — RENAL FUNCTION PANEL
Albumin: 2.2 g/dL — ABNORMAL LOW (ref 3.5–5.0)
Anion gap: 7 (ref 5–15)
BUN: 56 mg/dL — ABNORMAL HIGH (ref 6–20)
CO2: 22 mmol/L (ref 22–32)
Calcium: 7.6 mg/dL — ABNORMAL LOW (ref 8.9–10.3)
Chloride: 99 mmol/L (ref 98–111)
Creatinine, Ser: 7.18 mg/dL — ABNORMAL HIGH (ref 0.61–1.24)
GFR, Estimated: 9 mL/min — ABNORMAL LOW (ref >60)
Glucose, Bld: 113 mg/dL — ABNORMAL HIGH (ref 70–99)
Phosphorus: 3.3 mg/dL (ref 2.5–4.6)
Potassium: 3.4 mmol/L — ABNORMAL LOW (ref 3.5–5.1)
Sodium: 128 mmol/L — ABNORMAL LOW (ref 135–145)

## 2022-03-26 LAB — CBC
HCT: 25 % — ABNORMAL LOW (ref 39.0–52.0)
Hemoglobin: 8.8 g/dL — ABNORMAL LOW (ref 13.0–17.0)
MCH: 32.5 pg (ref 26.0–34.0)
MCHC: 35.2 g/dL (ref 30.0–36.0)
MCV: 92.3 fL (ref 80.0–100.0)
Platelets: 147 10*3/uL — ABNORMAL LOW (ref 150–400)
RBC: 2.71 MIL/uL — ABNORMAL LOW (ref 4.22–5.81)
RDW: 14 % (ref 11.5–15.5)
WBC: 13.5 10*3/uL — ABNORMAL HIGH (ref 4.0–10.5)
nRBC: 0 % (ref 0.0–0.2)

## 2022-03-26 LAB — HEPATIC FUNCTION PANEL
ALT: 125 U/L — ABNORMAL HIGH (ref 0–44)
AST: 299 U/L — ABNORMAL HIGH (ref 15–41)
Albumin: 2.2 g/dL — ABNORMAL LOW (ref 3.5–5.0)
Alkaline Phosphatase: 57 U/L (ref 38–126)
Bilirubin, Direct: 0.2 mg/dL (ref 0.0–0.2)
Indirect Bilirubin: 0.9 mg/dL (ref 0.3–0.9)
Total Bilirubin: 1.1 mg/dL (ref 0.3–1.2)
Total Protein: 4.4 g/dL — ABNORMAL LOW (ref 6.5–8.1)

## 2022-03-26 LAB — GLUCOSE, CAPILLARY
Glucose-Capillary: 120 mg/dL — ABNORMAL HIGH (ref 70–99)
Glucose-Capillary: 111 mg/dL — ABNORMAL HIGH (ref 70–99)
Glucose-Capillary: 133 mg/dL — ABNORMAL HIGH (ref 70–99)
Glucose-Capillary: 118 mg/dL — ABNORMAL HIGH (ref 70–99)
Glucose-Capillary: 108 mg/dL — ABNORMAL HIGH (ref 70–99)

## 2022-03-26 LAB — CULTURE, BLOOD (ROUTINE X 2)
Culture: NO GROWTH
Special Requests: ADEQUATE

## 2022-03-26 LAB — CK: Total CK: 9308 U/L — ABNORMAL HIGH (ref 49–397)

## 2022-03-26 LAB — MAGNESIUM: Magnesium: 2.7 mg/dL — ABNORMAL HIGH (ref 1.7–2.4)

## 2022-03-26 MED ORDER — METHOCARBAMOL 500 MG PO TABS
500.0000 mg | ORAL_TABLET | Freq: Four times a day (QID) | ORAL | Status: AC | PRN
  Administered 2022-03-28: 500 mg via ORAL
  Filled 2022-03-26 (×2): qty 1

## 2022-03-26 MED ORDER — METHOCARBAMOL 500 MG PO TABS
750.0000 mg | ORAL_TABLET | Freq: Once | ORAL | Status: AC | PRN
  Administered 2022-03-26: 750 mg via ORAL
  Filled 2022-03-26: qty 2

## 2022-03-26 MED ORDER — POTASSIUM CHLORIDE CRYS ER 20 MEQ PO TBCR
40.0000 meq | EXTENDED_RELEASE_TABLET | Freq: Once | ORAL | Status: AC
  Administered 2022-03-26: 40 meq via ORAL
  Filled 2022-03-26: qty 2

## 2022-03-26 NOTE — Progress Notes (Signed)
PT Cancellation Note  Patient Details Name: [name] [name] MRN: [id number] DOB: 1980/04/18   Cancelled Treatment:    Reason Eval/Treat Not Completed: Patient at procedure or test/unavailable - getting HD in room, will check back as schedule allows.  Stacie Glaze, PT DPT Acute Rehabilitation Services Pager [phone]  Office [phone]    Louis Matte 03/26/2022, 12:24 PM

## 2022-03-26 NOTE — Progress Notes (Signed)
? ?  Inpatient Rehab Admissions Coordinator : ? ?Per therapy recommendations, patient was screened for CIR candidacy by Isaul Landi RN MSN.  At this time patient appears to be a potential candidate for CIR. I will place a rehab consult per protocol for full assessment. Please call me with any questions. ? ?Orie Cuttino RN MSN ?Admissions Coordinator ?[phone] ?  ?
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
## 2022-03-26 NOTE — Procedures (Signed)
I was present at this dialysis session, have reviewed the session itself and made  appropriate changes Jannifer Hick MD  Via temp catheter working fine Clotted circuit x 1 - add heparin 2000 u bolus for restart UF 0.5L D/w RN bedside  [employer] pager [phone]   03/26/2022, 10:23 AM

## 2022-03-26 NOTE — Progress Notes (Signed)
eLink Physician-Brief Progress Note Patient Name: [name] [name] DOB: Oct 23, 1980 MRN: [id number]   Date of Service  03/26/2022  HPI/Events of Note  Patient with muscle spasms.  eICU Interventions  Robaxin ordered.        Kerry Kass Ravinder [place] 03/26/2022, 12:52 AM

## 2022-03-26 NOTE — Progress Notes (Signed)
Inpatient Rehab Admissions Coordinator:   Note pending further surgeries for fasciotomy closure.  Still on iHD for clearance.  Will follow up with pt on Friday.   Shann Medal, PT, DPT Admissions Coordinator [phone] 03/26/22  1:22 PM

## 2022-03-26 NOTE — Progress Notes (Signed)
eLink Physician-Brief Progress Note Patient Name: [name] [name] DOB: [birth year] MRN: [id number]   Date of Service  03/26/2022  HPI/Events of Note  Patient in [place] room complaining of left lower extremity numbness, and inability to move his foot.  eICU Interventions  PCCM ground crew requested to go and evaluate patient.        [name] [name] 03/26/2022, 10:08 PM

## 2022-03-26 NOTE — Procedures (Signed)
HD Note:  Some information was entered later than the data was gathered due to patient care needs. The stated time with the data is accurate.  Patient being treated at bedside. When this writer arrived in patient's room, he was sleeping deeply. Patient is alert and oriented.  He complains of cramping prior to the treatment and is asking for assistance for moving limbs that he is moving independently.  He states he cannot do things, but demonstrates he can.  When asked why he is asking for help he doesn't need, he states that he needs more pain medication.   Patient's nurse has given him what medication is available to him.  See MAR.  Cartridge had to be changed, full set up of [employer], due to dialyzer clotting.  See flow sheet for timing.    Access used: Right IJ temp catheter Access issues: None  Total UF removed:2500 ml    [facility]

## 2022-03-26 NOTE — Progress Notes (Signed)
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  CRRT started 03/21/22             - remains ~ anuric; off CRRT since 6pm 12/25.              - iHD for clearance; 4K dialysate, UF 0.5L; clotted filter x 2 add heparin 2000 bolus  -cont qshift bladder scan      - await renal recovery.   2.  Rhabdo             - L hand             - s/p faciotomies  - CK coming down   3.  AMS/ encephalopathy: improving             - had some ? Embolic CVAs             - s/p TTE   4.  Dispo: ICU  Subjective:    Seen in room.  No new issues. Plan for OR Thurs for arm debridement.   For HD today.  UOp 23mL   Objective:   BP 138/63   Pulse (!) 102   Temp 97.7 F (36.5 C) (Oral)   Resp 14   Wt 91.2 kg   SpO2 94%   BMI 31.48 kg/m   Intake/Output Summary (Last 24 hours) at 03/26/2022 1017 Last data filed at 03/26/2022 0000 Gross per 24 hour  Intake 1116.96 ml  Output 75 ml  Net 1041.96 ml    Weight change:   Physical Exam: GEN calm, lying in bed HEENT EOMI PERRL NECK no JVD PULM tachypneic CV tachycardic ABD soft EXT trace edema NEURO alert and oriented today  SKIN no rashes MSK L hand in OR dressing  Imaging: No results found.  Labs: BMET Recent Labs  Lab 03/22/22 1554 03/23/22 0420 03/23/22 1617 03/24/22 0353 03/24/22 1617 03/25/22 0539 03/26/22 0421  NA 137 134* 135 133* 133* 130* 128*  K 3.7 3.2* 4.0 4.0 3.8 3.8 3.4*  CL 97* 94* 96* 100 100 98 99  CO2 27 29 26 24 26 22 22   GLUCOSE 105* 100* 101* 99 102* 102* 113*  BUN 22* 23* 23* 21* 22* 34* 56*  CREATININE 4.37* 4.76* 3.78* 3.17* 3.03* 4.50* 7.18*  CALCIUM 7.0* 6.9* 7.6* 7.7* 7.6* 7.6* 7.6*  PHOS 5.2* 4.9* 3.8 2.6 3.1 2.9 3.3    CBC Recent Labs  Lab 03/22/22 0315 03/23/22 0418 03/24/22 0353 03/26/22 0419  WBC 24.1* 14.5* 14.1* 13.5*  HGB 15.1 10.9* 10.6* 8.8*  HCT 42.6 32.4* 29.9* 25.0*  MCV 92.6 95.3 92.9 92.3  PLT 133* 103* 113* 147*      Medications:     amLODipine  5 mg Oral Daily   aspirin EC  81 mg Oral Daily   Chlorhexidine Gluconate Cloth  6 each Topical q morning   Chlorhexidine Gluconate Cloth  6 each Topical Z2248   folic acid  1 mg Oral Daily   heparin  5,000 Units Subcutaneous Q8H   insulin aspart  0-9 Units Subcutaneous Q4H   multivitamin with minerals  1 tablet Oral Daily   thiamine  100 mg Oral Daily    Jannifer Hick MD [facility] Pager [phone]

## 2022-03-27 ENCOUNTER — Other Ambulatory Visit: Payer: Self-pay

## 2022-03-27 ENCOUNTER — Inpatient Hospital Stay (HOSPITAL_COMMUNITY): Payer: Medicaid Other | Admitting: Anesthesiology

## 2022-03-27 ENCOUNTER — Encounter (HOSPITAL_COMMUNITY)
Admission: EM | Disposition: A | Discharge status: Rehabilitation Facility | Payer: Self-pay | Source: Home / Self Care | Attending: Internal Medicine

## 2022-03-27 ENCOUNTER — Encounter (HOSPITAL_COMMUNITY): Payer: Self-pay | Admitting: Student

## 2022-03-27 DIAGNOSIS — Z992 Dependence on renal dialysis: Secondary | ICD-10-CM | POA: Diagnosis not present

## 2022-03-27 DIAGNOSIS — N179 Acute kidney failure, unspecified: Secondary | ICD-10-CM

## 2022-03-27 DIAGNOSIS — T8189XA Other complications of procedures, not elsewhere classified, initial encounter: Secondary | ICD-10-CM

## 2022-03-27 DIAGNOSIS — E669 Obesity, unspecified: Secondary | ICD-10-CM

## 2022-03-27 DIAGNOSIS — T796XXA Traumatic ischemia of muscle, initial encounter: Secondary | ICD-10-CM | POA: Diagnosis not present

## 2022-03-27 HISTORY — PX: DEBRIDEMENT AND CLOSURE WOUND: SHX5614

## 2022-03-27 HISTORY — PX: I & D EXTREMITY: SHX5045

## 2022-03-27 LAB — RENAL FUNCTION PANEL
Albumin: 2.5 g/dL — ABNORMAL LOW (ref 3.5–5.0)
Anion gap: 8 (ref 5–15)
BUN: 42 mg/dL — ABNORMAL HIGH (ref 6–20)
CO2: 24 mmol/L (ref 22–32)
Calcium: 8 mg/dL — ABNORMAL LOW (ref 8.9–10.3)
Chloride: 97 mmol/L — ABNORMAL LOW (ref 98–111)
Creatinine, Ser: 6.1 mg/dL — ABNORMAL HIGH (ref 0.61–1.24)
GFR, Estimated: 11 mL/min — ABNORMAL LOW (ref >60)
Glucose, Bld: 103 mg/dL — ABNORMAL HIGH (ref 70–99)
Phosphorus: 2.8 mg/dL (ref 2.5–4.6)
Potassium: 4.1 mmol/L (ref 3.5–5.1)
Sodium: 129 mmol/L — ABNORMAL LOW (ref 135–145)

## 2022-03-27 LAB — GLUCOSE, CAPILLARY
Glucose-Capillary: 105 mg/dL — ABNORMAL HIGH (ref 70–99)
Glucose-Capillary: 105 mg/dL — ABNORMAL HIGH (ref 70–99)
Glucose-Capillary: 112 mg/dL — ABNORMAL HIGH (ref 70–99)
Glucose-Capillary: 129 mg/dL — ABNORMAL HIGH (ref 70–99)
Glucose-Capillary: 88 mg/dL (ref 70–99)
Glucose-Capillary: 99 mg/dL (ref 70–99)

## 2022-03-27 LAB — POCT I-STAT, CHEM 8
BUN: 48 mg/dL — ABNORMAL HIGH (ref 6–20)
Calcium, Ion: 1.11 mmol/L — ABNORMAL LOW (ref 1.15–1.40)
Chloride: 95 mmol/L — ABNORMAL LOW (ref 98–111)
Creatinine, Ser: 7.9 mg/dL — ABNORMAL HIGH (ref 0.61–1.24)
Glucose, Bld: 97 mg/dL (ref 70–99)
HCT: 29 % — ABNORMAL LOW (ref 39.0–52.0)
Hemoglobin: 9.9 g/dL — ABNORMAL LOW (ref 13.0–17.0)
Potassium: 4.9 mmol/L (ref 3.5–5.1)
Sodium: 130 mmol/L — ABNORMAL LOW (ref 135–145)
TCO2: 24 mmol/L (ref 22–32)

## 2022-03-27 LAB — MAGNESIUM: Magnesium: 2.3 mg/dL (ref 1.7–2.4)

## 2022-03-27 LAB — HEPATITIS B SURFACE ANTIBODY, QUANTITATIVE: Hep B S AB Quant (Post): <3.1 m[IU]/mL — ABNORMAL LOW (ref >9.9)

## 2022-03-27 SURGERY — IRRIGATION AND DEBRIDEMENT EXTREMITY
Anesthesia: General | Site: Arm Lower | Laterality: Left

## 2022-03-27 MED ORDER — BUPIVACAINE HCL (PF) 0.25 % IJ SOLN
INTRAMUSCULAR | Status: AC
  Filled 2022-03-27: qty 30

## 2022-03-27 MED ORDER — ONDANSETRON HCL 4 MG/2ML IJ SOLN
INTRAMUSCULAR | Status: AC
  Filled 2022-03-27: qty 2

## 2022-03-27 MED ORDER — HYDROMORPHONE HCL 1 MG/ML IJ SOLN
1.0000 mg | INTRAMUSCULAR | Status: DC | PRN
  Administered 2022-03-27 – 2022-03-31 (×21): 1 mg via INTRAVENOUS
  Filled 2022-03-27 (×21): qty 1

## 2022-03-27 MED ORDER — FENTANYL CITRATE (PF) 100 MCG/2ML IJ SOLN
INTRAMUSCULAR | Status: DC | PRN
  Administered 2022-03-27: 100 ug via INTRAVENOUS

## 2022-03-27 MED ORDER — CHLORHEXIDINE GLUCONATE 0.12 % MT SOLN: 15.0000 mL | Freq: Once | OROMUCOSAL | Status: AC

## 2022-03-27 MED ORDER — CHLORHEXIDINE GLUCONATE 0.12 % MT SOLN
OROMUCOSAL | Status: AC
  Administered 2022-03-27: 15 mL via OROMUCOSAL
  Filled 2022-03-27: qty 15

## 2022-03-27 MED ORDER — LIDOCAINE 2% (20 MG/ML) 5 ML SYRINGE
INTRAMUSCULAR | Status: DC | PRN
  Administered 2022-03-27: 100 mg via INTRAVENOUS

## 2022-03-27 MED ORDER — MIDAZOLAM HCL 2 MG/2ML IJ SOLN
INTRAMUSCULAR | Status: AC
  Filled 2022-03-27: qty 2

## 2022-03-27 MED ORDER — FENTANYL CITRATE (PF) 100 MCG/2ML IJ SOLN
INTRAMUSCULAR | Status: AC
  Filled 2022-03-27: qty 2

## 2022-03-27 MED ORDER — MIDAZOLAM HCL 5 MG/5ML IJ SOLN
INTRAMUSCULAR | Status: DC | PRN
  Administered 2022-03-27: 2 mg via INTRAVENOUS

## 2022-03-27 MED ORDER — LABETALOL HCL 5 MG/ML IV SOLN
5.0000 mg | INTRAVENOUS | Status: DC | PRN
  Administered 2022-03-27: 5 mg via INTRAVENOUS

## 2022-03-27 MED ORDER — PROPOFOL 10 MG/ML IV BOLUS
INTRAVENOUS | Status: AC
  Filled 2022-03-27: qty 20

## 2022-03-27 MED ORDER — FENTANYL CITRATE (PF) 100 MCG/2ML IJ SOLN
25.0000 ug | INTRAMUSCULAR | Status: DC | PRN
  Administered 2022-03-27 (×2): 50 ug via INTRAVENOUS

## 2022-03-27 MED ORDER — 0.9 % SODIUM CHLORIDE (POUR BTL) OPTIME
TOPICAL | Status: DC | PRN
  Administered 2022-03-27: 1000 mL

## 2022-03-27 MED ORDER — OXYCODONE HCL 5 MG PO TABS: 5.0000 mg | ORAL_TABLET | Freq: Once | ORAL | Status: DC | PRN

## 2022-03-27 MED ORDER — AMISULPRIDE (ANTIEMETIC) 5 MG/2ML IV SOLN: 10.0000 mg | Freq: Once | INTRAVENOUS | Status: DC | PRN

## 2022-03-27 MED ORDER — FENTANYL CITRATE (PF) 100 MCG/2ML IJ SOLN
INTRAMUSCULAR | Status: AC
  Administered 2022-03-27: 50 ug via INTRAVENOUS
  Filled 2022-03-27: qty 2

## 2022-03-27 MED ORDER — FENTANYL CITRATE (PF) 250 MCG/5ML IJ SOLN
INTRAMUSCULAR | Status: AC
  Filled 2022-03-27: qty 5

## 2022-03-27 MED ORDER — FENTANYL CITRATE (PF) 100 MCG/2ML IJ SOLN
50.0000 ug | Freq: Once | INTRAMUSCULAR | Status: AC
  Administered 2022-03-27: 50 ug via INTRAVENOUS

## 2022-03-27 MED ORDER — ONDANSETRON HCL 4 MG/2ML IJ SOLN: 4.0000 mg | Freq: Once | INTRAMUSCULAR | Status: DC | PRN

## 2022-03-27 MED ORDER — ORAL CARE MOUTH RINSE: 15.0000 mL | Freq: Once | OROMUCOSAL | Status: AC

## 2022-03-27 MED ORDER — LIDOCAINE 2% (20 MG/ML) 5 ML SYRINGE
INTRAMUSCULAR | Status: AC
  Filled 2022-03-27: qty 5

## 2022-03-27 MED ORDER — FENTANYL CITRATE (PF) 100 MCG/2ML IJ SOLN: 50.0000 ug | Freq: Once | INTRAMUSCULAR | Status: AC

## 2022-03-27 MED ORDER — SODIUM CHLORIDE 0.9 % IV SOLN: INTRAVENOUS | Status: DC

## 2022-03-27 MED ORDER — BUPIVACAINE HCL (PF) 0.25 % IJ SOLN
INTRAMUSCULAR | Status: DC | PRN
  Administered 2022-03-27: 10 mL

## 2022-03-27 MED ORDER — SODIUM CHLORIDE 0.9% FLUSH
10.0000 mL | Freq: Two times a day (BID) | INTRAVENOUS | Status: DC
  Administered 2022-03-27 – 2022-04-08 (×24): 10 mL

## 2022-03-27 MED ORDER — LABETALOL HCL 5 MG/ML IV SOLN
INTRAVENOUS | Status: AC
  Filled 2022-03-27: qty 4

## 2022-03-27 MED ORDER — PROPOFOL 10 MG/ML IV BOLUS
INTRAVENOUS | Status: DC | PRN
  Administered 2022-03-27: 200 mg via INTRAVENOUS

## 2022-03-27 MED ORDER — SODIUM CHLORIDE 0.9% FLUSH: 10.0000 mL | INTRAVENOUS | Status: DC | PRN

## 2022-03-27 MED ORDER — OXYCODONE HCL 5 MG/5ML PO SOLN: 5.0000 mg | Freq: Once | ORAL | Status: DC | PRN

## 2022-03-27 MED ORDER — CEFAZOLIN SODIUM-DEXTROSE 2-3 GM-%(50ML) IV SOLR
INTRAVENOUS | Status: DC | PRN
  Administered 2022-03-27: 2 g via INTRAVENOUS

## 2022-03-27 MED ORDER — ONDANSETRON HCL 4 MG/2ML IJ SOLN
INTRAMUSCULAR | Status: DC | PRN
  Administered 2022-03-27: 4 mg via INTRAVENOUS

## 2022-03-27 SURGICAL SUPPLY — 65 items
ADAPTER CATH SYR TO TUBING 38M (ADAPTER) ×1 IMPLANT
ADPR CATH LL SYR 3/32 TPR (ADAPTER) ×1
BAG COUNTER SPONGE SURGICOUNT (BAG) ×1 IMPLANT
BAG SPNG CNTER NS LX DISP (BAG) ×1
BNDG CMPR 9X4 STRL LF SNTH (GAUZE/BANDAGES/DRESSINGS)
BNDG COHESIVE 2X5 TAN STRL LF (GAUZE/BANDAGES/DRESSINGS) IMPLANT
BNDG ELASTIC 3X5.8 VLCR STR LF (GAUZE/BANDAGES/DRESSINGS) ×1 IMPLANT
BNDG ELASTIC 4X5.8 VLCR STR LF (GAUZE/BANDAGES/DRESSINGS) ×1 IMPLANT
BNDG ESMARK 4X9 LF (GAUZE/BANDAGES/DRESSINGS) IMPLANT
BNDG GAUZE DERMACEA FLUFF 4 (GAUZE/BANDAGES/DRESSINGS) ×1 IMPLANT
BNDG GZE DERMACEA 4 6PLY (GAUZE/BANDAGES/DRESSINGS) ×1
CANNULA VESSEL 3MM 2 BLNT TIP (CANNULA) IMPLANT
CORD BIPOLAR FORCEPS 12FT (ELECTRODE) ×1 IMPLANT
COVER SURGICAL LIGHT HANDLE (MISCELLANEOUS) ×1 IMPLANT
CUFF TOURN SGL QUICK 18X4 (TOURNIQUET CUFF) IMPLANT
CUFF TOURN SGL QUICK 24 (TOURNIQUET CUFF)
CUFF TRNQT CYL 24X4X16.5-23 (TOURNIQUET CUFF) IMPLANT
DRAIN PENROSE 1/4X12 LTX STRL (WOUND CARE) IMPLANT
DRAPE TOWEL STERILE LF 18X24 (DRAPES) IMPLANT
GAUZE PAD ABD 8X10 STRL (GAUZE/BANDAGES/DRESSINGS) ×2 IMPLANT
GAUZE SPONGE 4X4 12PLY STRL (GAUZE/BANDAGES/DRESSINGS) ×1 IMPLANT
GAUZE XEROFORM 1X8 LF (GAUZE/BANDAGES/DRESSINGS) ×1 IMPLANT
GAUZE XEROFORM 5X9 LF (GAUZE/BANDAGES/DRESSINGS) IMPLANT
GLOVE BIO SURGEON STRL SZ7.5 (GLOVE) ×1 IMPLANT
GLOVE BIOGEL PI IND STRL 8 (GLOVE) ×1 IMPLANT
GLOVE BIOGEL PI IND STRL 8.5 (GLOVE) IMPLANT
GLOVE PI ORTHO PRO STRL SZ8 (GLOVE) IMPLANT
GLOVE SURG ORTHO 8.0 STRL STRW (GLOVE) IMPLANT
GOWN STRL REUS W/ TWL LRG LVL3 (GOWN DISPOSABLE) ×1 IMPLANT
GOWN STRL REUS W/ TWL XL LVL3 (GOWN DISPOSABLE) ×1 IMPLANT
GOWN STRL REUS W/TWL LRG LVL3 (GOWN DISPOSABLE) ×1
GOWN STRL REUS W/TWL XL LVL3 (GOWN DISPOSABLE) ×1
KIT BASIN OR (CUSTOM PROCEDURE TRAY) ×1 IMPLANT
KIT REMOVER STAPLE SKIN (MISCELLANEOUS) IMPLANT
KIT TURNOVER KIT B (KITS) ×1 IMPLANT
LOOP VASCLR MAXI BLUE 18IN ST (MISCELLANEOUS) IMPLANT
LOOP VASCULAR MAXI 18 BLUE (MISCELLANEOUS) ×2
LOOP VESSEL MAXI BLUE (MISCELLANEOUS) IMPLANT
LOOPS VASCLR MAXI BLUE 18IN ST (MISCELLANEOUS) ×2 IMPLANT
MANIFOLD NEPTUNE II (INSTRUMENTS) IMPLANT
NDL HYPO 25X1 1.5 SAFETY (NEEDLE) IMPLANT
NEEDLE HYPO 25X1 1.5 SAFETY (NEEDLE) ×1 IMPLANT
NS IRRIG 1000ML POUR BTL (IV SOLUTION) ×1 IMPLANT
PACK ORTHO EXTREMITY (CUSTOM PROCEDURE TRAY) ×1 IMPLANT
PAD ARMBOARD 7.5X6 YLW CONV (MISCELLANEOUS) ×2 IMPLANT
SET CYSTO W/LG BORE CLAMP LF (SET/KITS/TRAYS/PACK) IMPLANT
SOL PREP POV-IOD 4OZ 10% (MISCELLANEOUS) ×2 IMPLANT
SPIKE FLUID TRANSFER (MISCELLANEOUS) ×1 IMPLANT
SPONGE T-LAP 18X18 ~~LOC~~+RFID (SPONGE) IMPLANT
SPONGE T-LAP 4X18 ~~LOC~~+RFID (SPONGE) ×1 IMPLANT
STAPLER VISISTAT 35W (STAPLE) IMPLANT
SUT ETHILON 3 0 FSL (SUTURE) IMPLANT
SUT ETHILON 4 0 P 3 18 (SUTURE) IMPLANT
SUT ETHILON 4 0 PS 2 18 (SUTURE) IMPLANT
SUT MON AB 5-0 P3 18 (SUTURE) IMPLANT
SWAB COLLECTION DEVICE MRSA (MISCELLANEOUS) IMPLANT
SWAB CULTURE ESWAB REG 1ML (MISCELLANEOUS) IMPLANT
SYR 20ML LL LF (SYRINGE) ×1 IMPLANT
SYR CONTROL 10ML LL (SYRINGE) IMPLANT
TOWEL GREEN STERILE (TOWEL DISPOSABLE) ×1 IMPLANT
TUBE CONNECTING 12X1/4 (SUCTIONS) ×1 IMPLANT
TUBE FEEDING ENTERAL 5FR 16IN (TUBING) IMPLANT
UNDERPAD 30X36 HEAVY ABSORB (UNDERPADS AND DIAPERS) ×1 IMPLANT
VASCULAR TIE MAXI BLUE 18IN ST (MISCELLANEOUS) ×2
YANKAUER SUCT BULB TIP NO VENT (SUCTIONS) ×1 IMPLANT

## 2022-03-27 NOTE — Progress Notes (Addendum)
PCCM Interval Progress Note  Requested by E-Link to assess patient for complaint of new-onset LLE numbness and reported inability to move the extremity. Unable to camera into room and ground team asked to assess.  On arrival, patient was alert and oriented and moving all 4 extremities in the bed. He reported pain of the LLE and "pins and needles" sensation c/w paresthesias. On my exam, patient was able to move the LLE with good ROM at the hip/knee; he was unable to dorsiflex/plantarflex the left ankle/foot. Patient reported good sensation. Pulses (DP, PT) both 2-3+ and easily palpable with L foot warm and dry without significant swelling.   Patient reported that he has been laying in the same position since arriving to the unit (L hip externally rotated with knee facing out, lateral aspect of foot/ankle on bed). Patient does have blistering/ulceration to L lateral mallelous and lateral aspect of 5th metatarsal (please see photo in Media tab). I also assessed patient's L hip/gluteal wound, as this was a source of concern for pressure injury/muscle necrosis in the setting of severe rhabdo. Area with deep purple ecchymosis and blistered raw skin with Mepilex in place (picture under Media tab).    Changed patients bed linens and repositioned him in bed to alleviate pressure from L hip/gluteus. RN notified of findings. Advised to please call us back if symptoms continue/worsen.  Lestine Mount, PA-C [facility] 03/26/2022 10:51PM  Please see [email or web address] for pager details.  From 7A-7P if no response, please call [phone] After hours, please call ELink [phone]

## 2022-03-27 NOTE — Anesthesia Procedure Notes (Signed)
Procedure Name: LMA Insertion Date/Time: 03/27/2022 4:04 PM  Performed by: Moshe Salisbury, CRNAPre-anesthesia Checklist: Patient identified, Emergency Drugs available, Suction available and Patient being monitored Patient Re-evaluated:Patient Re-evaluated prior to induction Oxygen Delivery Method: Circle System Utilized Preoxygenation: Pre-oxygenation with 100% oxygen Induction Type: IV induction Ventilation: Mask ventilation without difficulty LMA: LMA inserted LMA Size: 5.0 Number of attempts: 1 Placement Confirmation: positive ETCO2 Tube secured with: Tape Dental Injury: Teeth and Oropharynx as per pre-operative assessment

## 2022-03-27 NOTE — Progress Notes (Signed)
PT Cancellation Note  Patient Details Name: [name] [name] MRN: [id number] DOB: 11-19-1980   Cancelled Treatment:    Reason Eval/Treat Not Completed: Patient at procedure or test/unavailable.  Further work today on pt's compartment syndrome with hope to close wound.  Retry at another time.   Ramond Dial 03/27/2022, 3:05 PM  Mee Hives, PT PhD Acute Rehab Dept. Number: [place] [facility] [place]

## 2022-03-27 NOTE — Progress Notes (Signed)
Subjective: Day of Surgery Procedure(s) (LRB): LEFT FOREARM IRRIGATION AND DEBRIDEMENT, POSSIBLE WOUND CLOSURE (Left) Patient reports pain in left forearm, but has been using arm during the day.    Objective: Vital signs in last 24 hours: Temp:  [97.6 F (36.4 C)-98.2 F (36.8 C)] 97.6 F (36.4 C) (12/28 0818) Pulse Rate:  [95-101] 95 (12/28 1355) Resp:  [13-20] 16 (12/28 1355) BP: (137-196)/(56-89) 168/81 (12/28 1431) SpO2:  [95 %-100 %] 98 % (12/28 1355)  Intake/Output from previous day: 12/27 0701 - 12/28 0700 In: 880 [P.O.:880] Out: 2650 [Urine:150] Intake/Output this shift: Total I/O In: -  Out: 20 [Urine:20]  Recent Labs    03/26/22 0419 03/27/22 1421  HGB 8.8* 9.9*   Recent Labs    03/26/22 0419 03/27/22 1421  WBC 13.5*  --   RBC 2.71*  --   HCT 25.0* 29.0*  PLT 147*  --    Recent Labs    03/26/22 0421 03/27/22 0334 03/27/22 1421  NA 128* 129* 130*  K 3.4* 4.1 4.9  CL 99 97* 95*  CO2 22 24  --   BUN 56* 42* 48*  CREATININE 7.18* 6.10* 7.90*  GLUCOSE 113* 103* 97  CALCIUM 7.6* 8.0*  --    No results for input(s): "LABPT", "INR" in the last 72 hours.  Intact sensation and capillary refill.  Able to wiggle fingers.  Serous drainage on dressing.   Assessment/Plan: Day of Surgery Procedure(s) (LRB): LEFT FOREARM IRRIGATION AND DEBRIDEMENT, POSSIBLE WOUND CLOSURE (Left) Plan return to OR for irrigation and debridement and closure of wounds as possible.  Risks, benefits and alternatives of surgery were discussed including risks of blood loss, infection, damage to nerves/vessels/tendons/ligament/bone, failure of surgery, need for additional surgery, complication with wound healing, stiffness have been discussed.  He voiced understanding of these risks and elected to proceed.  Surgical consent has been signed.  Leanora Cover 03/27/2022, 3:11 PM
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
## 2022-03-27 NOTE — Progress Notes (Signed)
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  CRRT started 03/21/22             - remains ~ anuric; off CRRT since 6pm 12/25.              - iHD for clearance 12/27; clotted filter x 2 so would use heparin going forward if able  -plan next HD tomorrow but if showing signs of recovery can hold  -cont qshift bladder scan      - await renal recovery.   2.  Rhabdo             - L hand             - s/p faciotomies  - CK coming down   3.  AMS/ encephalopathy: improved             - had some ? Embolic CVAs             - s/p TTE   4.  Dispo: admitted, likely for CIR post acute needs  Subjective:    Seen in room  - on floor now from ICU.  No new issues. Tentative plans was for OR today  for arm debridement.  UOP 166mL yesterday from 75 day prior.  Had HD yest too UF 2.5L    Objective:   BP 137/81 (BP Location: Right Leg)   Pulse 98   Temp 97.8 F (36.6 C) (Oral)   Resp 20   Wt 91.2 kg   SpO2 100%   BMI 31.48 kg/m   Intake/Output Summary (Last 24 hours) at 03/27/2022 2353 Last data filed at 03/27/2022 6144 Gross per 24 hour  Intake 880 ml  Output 2650 ml  Net -1770 ml    Weight change:   Physical Exam: GEN calm, lying in bed HEENT EOMI PERRL NECK no JVD PULM clear, normal WOB CV RRR ABD soft EXT trace edema NEURO alert and oriented today - L foot noted to have minimal movement - primary team evaluating SKIN no rashes MSK L hand in OR dressing  Imaging: No results found.  Labs: BMET Recent Labs  Lab 03/23/22 0420 03/23/22 1617 03/24/22 0353 03/24/22 1617 03/25/22 0539 03/26/22 0421 03/27/22 0334  NA 134* 135 133* 133* 130* 128* 129*  K 3.2* 4.0 4.0 3.8 3.8 3.4* 4.1  CL 94* 96* 100 100 98 99 97*  CO2 29 26 24 26 22 22 24   GLUCOSE 100* 101* 99 102* 102* 113* 103*  BUN 23* 23* 21* 22* 34* 56* 42*  CREATININE 4.76* 3.78* 3.17* 3.03* 4.50* 7.18* 6.10*  CALCIUM 6.9* 7.6* 7.7* 7.6* 7.6* 7.6*  8.0*  PHOS 4.9* 3.8 2.6 3.1 2.9 3.3 2.8    CBC Recent Labs  Lab 03/22/22 0315 03/23/22 0418 03/24/22 0353 03/26/22 0419  WBC 24.1* 14.5* 14.1* 13.5*  HGB 15.1 10.9* 10.6* 8.8*  HCT 42.6 32.4* 29.9* 25.0*  MCV 92.6 95.3 92.9 92.3  PLT 133* 103* 113* 147*     Medications:     amLODipine  5 mg Oral Daily   aspirin EC  81 mg Oral Daily   Chlorhexidine Gluconate Cloth  6 each Topical q morning   Chlorhexidine Gluconate Cloth  6 each Topical R1540   folic acid  1 mg Oral Daily   heparin  5,000 Units Subcutaneous Q8H   insulin aspart  0-9 Units Subcutaneous Q4H   multivitamin with minerals  1 tablet  Oral Daily   sodium chloride flush  10-40 mL Intracatheter Q12H   thiamine  100 mg Oral Daily    Jannifer Hick MD [facility] Pager [phone]
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
## 2022-03-27 NOTE — Transfer of Care (Signed)
Immediate Anesthesia Transfer of Care Note  Patient: [name] [name]  Procedure(s) Performed: LEFT FOREARM IRRIGATION AND DEBRIDEMENT, POSSIBLE WOUND CLOSURE (Left: Arm Lower)  Patient Location: PACU  Anesthesia Type:General  Level of Consciousness: awake  Airway & Oxygen Therapy: Patient Spontanous Breathing and Patient connected to nasal cannula oxygen  Post-op Assessment: Report given to RN and Post -op Vital signs reviewed and stable  Post vital signs: Reviewed and stable  Last Vitals:  Vitals Value Taken Time  BP 175/98 03/27/22 1735  Temp    Pulse 104 03/27/22 1741  Resp 15 03/27/22 1741  SpO2 98 % 03/27/22 1741  Vitals shown include unvalidated device data.  Last Pain:  Vitals:   03/27/22 1423  TempSrc:   PainSc: 8       Patients Stated Pain Goal: 0 (14/97/02 6378)  Complications: No notable events documented.

## 2022-03-27 NOTE — Op Note (Addendum)
NAME: [name] [name] MEDICAL RECORD NO: [id number] DATE OF BIRTH: Mar 18, 1981 FACILITY: [facility] LOCATION: [facility] OR PHYSICIAN: Tennis Must, MD   OPERATIVE REPORT   DATE OF PROCEDURE: 03/27/22    PREOPERATIVE DIAGNOSIS: Status post left forearm volar and dorsal fasciotomy   POSTOPERATIVE DIAGNOSIS: Status post left forearm volar and dorsal fasciotomy   PROCEDURE: 1.  Irrigation debridement left forearm fasciotomy wounds including skin subcutaneous tissues 2.  Closure of left dorsal fasciotomy wound and proximal and distal portion of the volar fasciotomy wound   SURGEON:  Leanora Cover, M.D.   ASSISTANT: none   ANESTHESIA:  General   INTRAVENOUS FLUIDS:  Per anesthesia flow sheet.   ESTIMATED BLOOD LOSS:  Minimal.   COMPLICATIONS:  None.   SPECIMENS:  none   TOURNIQUET TIME:  None   DISPOSITION:  Stable to PACU.   INDICATIONS: 41 year old male status post volar and dorsal left forearm fasciotomies for compartment syndrome.  Returns to the OR today for irrigation debridement and possible closure of wounds.  Risks, benefits and alternatives of surgery were discussed including the risks of blood loss, infection, damage to nerves, vessels, tendons, ligaments, bone for surgery, need for additional surgery, complications with wound healing, continued pain, stiffness, , need for repeat irrigation and debridement, possible need for skin grafting.  He voiced understanding of these risks and elected to proceed.  OPERATIVE COURSE:  After being identified preoperatively by myself,  the patient and I agreed on the procedure and site of the procedure.  The surgical site was marked.  Surgical consent had been signed. Preoperative IV antibiotic prophylaxis was given. He was transferred to the operating room and placed on the operating table in supine position with the Left upper extremity on an arm board.  General anesthesia was induced by the anesthesiologist.  Left upper extremity was  prepped and draped in normal sterile orthopedic fashion.  A surgical pause was performed between the surgeons, anesthesia, and operating room staff and all were in agreement as to the patient, procedure, and site of procedure.  Tourniquet was not inflated.  The staples and vessel loop drains were removed from the wounds.  There is no purulence.  There was serous drainage.  Muscle was all red and appeared viable.  The volar muscles were responsive to stimulation with the Bovie.  The FDS and FDP muscles were slower but responsive.  The dorsal muscles were responsive as well.  Radial and ulnar arteries pulsatile.  The wounds were all copiously irrigated with sterile saline by cystoscopy tubing.  They were debrided of any devitalized subcutaneous tissues with the pickups.  Hematoma was removed with a Ray-Tec and sponges.  The dorsal wound was closed over vessel loop drain with staples.  The proximal aspect of the volar wound until just distal to the elbow was closed with staples.  The carpal tunnel portion of the wound distally was closed with 3-0 nylon in a horizontal mattress fashion.  The remaining portion of the volar wound was unable to be reapproximated.  The remaining open portion of wound measured 8 x 22 cm.  Vessel loop drain was again used in a shoelace pattern with staples along the edges of the skin.  Skin edges were injected with quarter percent plain Marcaine to aid in postoperative analgesia.  Wounds were all dressed with sterile Xeroform and 4 x 4's and ABDs and wrapped with Kerlix bandage.  The tourniquet had never been inflated.   The operative  drapes were broken down.  The patient was awoken from anesthesia safely.  He was transferred back to the stretcher and taken to PACU in stable condition.   Leanora Cover, MD Electronically signed, 03/27/22  Addendum (05/09/2022): Add estimated wound size after debridement.  Estimated post debridement wound size 15 x 22 cm.

## 2022-03-28 DIAGNOSIS — F17210 Nicotine dependence, cigarettes, uncomplicated: Secondary | ICD-10-CM

## 2022-03-28 DIAGNOSIS — T796XXA Traumatic ischemia of muscle, initial encounter: Secondary | ICD-10-CM | POA: Diagnosis not present

## 2022-03-28 DIAGNOSIS — S41102A Unspecified open wound of left upper arm, initial encounter: Secondary | ICD-10-CM

## 2022-03-28 LAB — CBC WITH DIFFERENTIAL/PLATELET
Abs Granulocyte: 7.3 10*3/uL — ABNORMAL HIGH (ref 1.5–6.5)
Abs Immature Granulocytes: 2.2 10*3/uL — ABNORMAL HIGH (ref 0.00–0.07)
Basophils Absolute: 0 10*3/uL (ref 0.0–0.1)
Basophils Relative: 0 %
Eosinophils Absolute: 0.4 10*3/uL (ref 0.0–0.5)
Eosinophils Relative: 3 %
HCT: 22.2 % — ABNORMAL LOW (ref 39.0–52.0)
Hemoglobin: 7.9 g/dL — ABNORMAL LOW (ref 13.0–17.0)
Immature Granulocytes: 0 %
Lymphocytes Relative: 12 %
Lymphs Abs: 1.6 10*3/uL (ref 0.7–4.0)
MCH: 33.5 pg (ref 26.0–34.0)
MCHC: 35.6 g/dL (ref 30.0–36.0)
MCV: 94.1 fL (ref 80.0–100.0)
Monocytes Absolute: 1.7 10*3/uL — ABNORMAL HIGH (ref 0.1–1.0)
Monocytes Relative: 13 %
Neutro Abs: 7.3 10*3/uL (ref 1.7–7.7)
Neutrophils Relative %: 72 %
Platelets: 204 10*3/uL (ref 150–400)
RBC: 2.36 MIL/uL — ABNORMAL LOW (ref 4.22–5.81)
RDW: 15.4 % (ref 11.5–15.5)
Smear Review: NORMAL
WBC: 13.2 10*3/uL — ABNORMAL HIGH (ref 4.0–10.5)
nRBC: 0.2 % (ref 0.0–0.2)

## 2022-03-28 LAB — CK: Total CK: 3371 U/L — ABNORMAL HIGH (ref 49–397)

## 2022-03-28 LAB — COMPREHENSIVE METABOLIC PANEL
ALT: 76 U/L — ABNORMAL HIGH (ref 0–44)
AST: 121 U/L — ABNORMAL HIGH (ref 15–41)
Albumin: 2.3 g/dL — ABNORMAL LOW (ref 3.5–5.0)
Alkaline Phosphatase: 44 U/L (ref 38–126)
Anion gap: 8 (ref 5–15)
BUN: 62 mg/dL — ABNORMAL HIGH (ref 6–20)
CO2: 24 mmol/L (ref 22–32)
Calcium: 7.6 mg/dL — ABNORMAL LOW (ref 8.9–10.3)
Chloride: 97 mmol/L — ABNORMAL LOW (ref 98–111)
Creatinine, Ser: 8.47 mg/dL — ABNORMAL HIGH (ref 0.61–1.24)
GFR, Estimated: 7 mL/min — ABNORMAL LOW (ref >60)
Glucose, Bld: 113 mg/dL — ABNORMAL HIGH (ref 70–99)
Potassium: 4.4 mmol/L (ref 3.5–5.1)
Sodium: 129 mmol/L — ABNORMAL LOW (ref 135–145)
Total Bilirubin: 0.8 mg/dL (ref 0.3–1.2)
Total Protein: 4.6 g/dL — ABNORMAL LOW (ref 6.5–8.1)

## 2022-03-28 LAB — GLUCOSE, CAPILLARY
Glucose-Capillary: 102 mg/dL — ABNORMAL HIGH (ref 70–99)
Glucose-Capillary: 106 mg/dL — ABNORMAL HIGH (ref 70–99)
Glucose-Capillary: 109 mg/dL — ABNORMAL HIGH (ref 70–99)
Glucose-Capillary: 114 mg/dL — ABNORMAL HIGH (ref 70–99)
Glucose-Capillary: 119 mg/dL — ABNORMAL HIGH (ref 70–99)
Glucose-Capillary: 122 mg/dL — ABNORMAL HIGH (ref 70–99)
Glucose-Capillary: 151 mg/dL — ABNORMAL HIGH (ref 70–99)

## 2022-03-28 LAB — PHOSPHORUS: Phosphorus: 4.3 mg/dL (ref 2.5–4.6)

## 2022-03-28 LAB — MAGNESIUM: Magnesium: 2.3 mg/dL (ref 1.7–2.4)

## 2022-03-28 MED ORDER — GABAPENTIN 100 MG PO CAPS
100.0000 mg | ORAL_CAPSULE | ORAL | Status: AC
  Administered 2022-03-28: 100 mg via ORAL
  Filled 2022-03-28: qty 1

## 2022-03-28 MED ORDER — HYDRALAZINE HCL 25 MG PO TABS
25.0000 mg | ORAL_TABLET | Freq: Three times a day (TID) | ORAL | Status: DC
  Administered 2022-03-29 – 2022-04-08 (×27): 25 mg via ORAL
  Filled 2022-03-28 (×31): qty 1

## 2022-03-28 NOTE — Progress Notes (Signed)
Inpatient Rehab Admissions Coordinator:   Note nephrology continues to monitor for renal recovery.  We will continue to follow progress with therapies and need for post-acute rehab.   Shann Medal, PT, DPT Admissions Coordinator [phone] 03/28/22  10:51 AM
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
## 2022-03-28 NOTE — Plan of Care (Signed)
Given high inpatient HD census/staffing, will move HD to 12/30.  [name] Quint, MD [facility]
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
## 2022-03-28 NOTE — Progress Notes (Signed)
Orthopedic Tech Progress Note Patient Details:  [name] [name] [birth year] [id number] Called in order to [facility] for AFO Patient ID: [name] [name], male   DOB: 1980-05-30, 41 y.o.   MRN: [id number]  [name] [name] 03/28/2022, 10:29 AM

## 2022-03-28 NOTE — Progress Notes (Signed)
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  CRRT started 03/21/22             - remains ~ anuric; off CRRT since 6pm 12/25.              - iHD for clearance 12/27; clotted filter x 2 so would use heparin going forward if able  -plan next HD today, no signs of renal recovery yet but will CTM  -cont qshift bladder scan  -if no signs of recovery soon, will need to plan for outpatient HD/TDC placement/CLIP   2.  Rhabdo             - L hand             - s/p faciotomies  - CK continues to improve   3.  AMS/ encephalopathy: improved             - had some ? Embolic CVAs             - s/p TTE  4. Hyponatremia  -will manage with HD  5. Anemia -hgb down to 7.9 from 9.9, likely related to surgery -transfuse PRN  6. LLE pain -workup/mgmt per primary service   Dispo: admitted, likely for CIR post acute needs  Gean Quint, MD [facility]  Subjective:    Patient seen and examined bedside. S/p LUE debridement 12/28 Does complain of ongoing LLE pain, "feels dead" and "feels like a charlie horse"   Objective:   BP (!) 195/91 (BP Location: Right Leg)   Pulse (!) 106   Temp (!) 97.5 F (36.4 C) (Oral)   Resp 17   Wt 91 kg   SpO2 100%   BMI 31.42 kg/m   Intake/Output Summary (Last 24 hours) at 03/28/2022 1029 Last data filed at 03/28/2022 0305 Gross per 24 hour  Intake 697.35 ml  Output 100 ml  Net 597.35 ml   Weight change:   Physical Exam: Gen: NAD, sitting up in chair CV: RRR Resp: CTA b/l Abd: soft Ext: no sig edema b/l LE, 2+ DP b/l, LUE dressing in place (leaking) Neuro: awake, alert Dialysis access: RIJ temp HD line c/d/i  Imaging: No results found.  Labs: BMET Recent Labs  Lab 03/23/22 1617 03/24/22 0353 03/24/22 1617 03/25/22 0539 03/26/22 0421 03/27/22 0334 03/27/22 1421 03/28/22 0420  NA 135 133* 133* 130* 128* 129* 130* 129*  K 4.0 4.0 3.8 3.8 3.4* 4.1 4.9 4.4   CL 96* 100 100 98 99 97* 95* 97*  CO2 26 24 26 22 22 24   --  24  GLUCOSE 101* 99 102* 102* 113* 103* 97 113*  BUN 23* 21* 22* 34* 56* 42* 48* 62*  CREATININE 3.78* 3.17* 3.03* 4.50* 7.18* 6.10* 7.90* 8.47*  CALCIUM 7.6* 7.7* 7.6* 7.6* 7.6* 8.0*  --  7.6*  PHOS 3.8 2.6 3.1 2.9 3.3 2.8  --  4.3   CBC Recent Labs  Lab 03/23/22 0418 03/24/22 0353 03/26/22 0419 03/27/22 1421 03/28/22 0420  WBC 14.5* 14.1* 13.5*  --  13.2*  NEUTROABS  --   --   --   --  7.3  HGB 10.9* 10.6* 8.8* 9.9* 7.9*  HCT 32.4* 29.9* 25.0* 29.0* 22.2*  MCV 95.3 92.9 92.3  --  94.1  PLT 103* 113* 147*  --  204    Medications:     amLODipine  5 mg Oral Daily   aspirin  EC  81 mg Oral Daily   Chlorhexidine Gluconate Cloth  6 each Topical q morning   Chlorhexidine Gluconate Cloth  6 each Topical [id number]   folic acid  1 mg Oral Daily   heparin  5,000 Units Subcutaneous Q8H   insulin aspart  0-9 Units Subcutaneous Q4H   multivitamin with minerals  1 tablet Oral Daily   sodium chloride flush  10-40 mL Intracatheter Q12H   thiamine  100 mg Oral Daily

## 2022-03-29 DIAGNOSIS — T796XXA Traumatic ischemia of muscle, initial encounter: Secondary | ICD-10-CM | POA: Diagnosis not present

## 2022-03-29 LAB — RENAL FUNCTION PANEL
Albumin: 2.2 g/dL — ABNORMAL LOW (ref 3.5–5.0)
Anion gap: 10 (ref 5–15)
BUN: 73 mg/dL — ABNORMAL HIGH (ref 6–20)
CO2: 20 mmol/L — ABNORMAL LOW (ref 22–32)
Calcium: 7.4 mg/dL — ABNORMAL LOW (ref 8.9–10.3)
Chloride: 98 mmol/L (ref 98–111)
Creatinine, Ser: 9.63 mg/dL — ABNORMAL HIGH (ref 0.61–1.24)
GFR, Estimated: 6 mL/min — ABNORMAL LOW (ref >60)
Glucose, Bld: 121 mg/dL — ABNORMAL HIGH (ref 70–99)
Phosphorus: 5.2 mg/dL — ABNORMAL HIGH (ref 2.5–4.6)
Potassium: 4.4 mmol/L (ref 3.5–5.1)
Sodium: 128 mmol/L — ABNORMAL LOW (ref 135–145)

## 2022-03-29 LAB — CALCIUM, IONIZED: Calcium, Ionized, Serum: 4.6 mg/dL (ref 4.5–5.6)

## 2022-03-29 LAB — GLUCOSE, CAPILLARY
Glucose-Capillary: 105 mg/dL — ABNORMAL HIGH (ref 70–99)
Glucose-Capillary: 106 mg/dL — ABNORMAL HIGH (ref 70–99)
Glucose-Capillary: 125 mg/dL — ABNORMAL HIGH (ref 70–99)
Glucose-Capillary: 113 mg/dL — ABNORMAL HIGH (ref 70–99)
Glucose-Capillary: 113 mg/dL — ABNORMAL HIGH (ref 70–99)

## 2022-03-29 LAB — MAGNESIUM: Magnesium: 2.2 mg/dL (ref 1.7–2.4)

## 2022-03-29 MED ORDER — LIDOCAINE 5 % EX PTCH
1.0000 | MEDICATED_PATCH | Freq: Every day | CUTANEOUS | Status: DC
  Administered 2022-03-29 – 2022-04-07 (×10): 1 via TRANSDERMAL
  Filled 2022-03-29 (×10): qty 1
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
## 2022-03-29 NOTE — Progress Notes (Signed)
Patient dressing on left arm saturated. Spoke with attending about wound care orders and was directed to plastic surgery due to extent of patient's wound. Paged general surgery who is covering for plastic surgery today, awaiting reply.

## 2022-03-29 NOTE — Progress Notes (Signed)
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  CRRT started 03/21/22             - remains ~ anuric; off CRRT since 6pm 12/25.              - iHD for clearance 12/27; clotted filter x 2 so would use heparin going forward if able  -plan next HD today (moved to today given high inpt HD census/staffing), no signs of renal recovery yet but will CTM. Next HD on Tues  -cont qshift bladder scan  -no evidence of renal recovery at this junction  -if no signs of recovery soon, will need to plan for outpatient HD/TDC placement/CLIP   2.  Rhabdo             - L hand             - s/p faciotomies  - CK improved   3.  AMS/ encephalopathy: improved             - had some ? Embolic CVAs             - s/p TTE  4. Hyponatremia  -will manage with HD  5. Anemia -hgb down to 7.9 from 9.9, likely related to surgery -transfuse PRN  6. LLE pain -workup/mgmt per primary service   Dispo: admitted, likely for CIR post acute needs  Gean Quint, MD [facility]  Subjective:    Patient seen and examined bedside. No acute events. Complaints of ongoing LLE pain   Objective:   BP 139/76   Pulse (!) 101   Temp 98.6 F (37 C) (Oral)   Resp 18   Wt 89.7 kg   SpO2 100%   BMI 30.97 kg/m   Intake/Output Summary (Last 24 hours) at 03/29/2022 1119 Last data filed at 03/28/2022 2223 Gross per 24 hour  Intake 610 ml  Output 250 ml  Net 360 ml   Weight change: -1.3 kg  Physical Exam: Gen: NAD CV: RRR Resp: CTA b/l Abd: soft Ext: no sig edema b/l LE, 2+ DP b/l, LUE dressing in place Neuro: awake, alert Dialysis access: RIJ temp HD line c/d/i  Imaging: No results found.  Labs: BMET Recent Labs  Lab 03/24/22 0353 03/24/22 1617 03/25/22 0539 03/26/22 0421 03/27/22 0334 03/27/22 1421 03/28/22 0420 03/29/22 0303  NA 133* 133* 130* 128* 129* 130* 129* 128*  K 4.0 3.8 3.8 3.4* 4.1 4.9 4.4 4.4  CL 100 100 98 99  97* 95* 97* 98  CO2 24 26 22 22 24   --  24 20*  GLUCOSE 99 102* 102* 113* 103* 97 113* 121*  BUN 21* 22* 34* 56* 42* 48* 62* 73*  CREATININE 3.17* 3.03* 4.50* 7.18* 6.10* 7.90* 8.47* 9.63*  CALCIUM 7.7* 7.6* 7.6* 7.6* 8.0*  --  7.6* 7.4*  PHOS 2.6 3.1 2.9 3.3 2.8  --  4.3 5.2*   CBC Recent Labs  Lab 03/23/22 0418 03/24/22 0353 03/26/22 0419 03/27/22 1421 03/28/22 0420  WBC 14.5* 14.1* 13.5*  --  13.2*  NEUTROABS  --   --   --   --  7.3  HGB 10.9* 10.6* 8.8* 9.9* 7.9*  HCT 32.4* 29.9* 25.0* 29.0* 22.2*  MCV 95.3 92.9 92.3  --  94.1  PLT 103* 113* 147*  --  204    Medications:     amLODipine  5 mg Oral Daily   aspirin EC  81 mg Oral Daily   Chlorhexidine Gluconate Cloth  6 each Topical q morning   Chlorhexidine Gluconate Cloth  6 each Topical [id number]   folic acid  1 mg Oral Daily   heparin  5,000 Units Subcutaneous Q8H   hydrALAZINE  25 mg Oral Q8H   insulin aspart  0-9 Units Subcutaneous Q4H   multivitamin with minerals  1 tablet Oral Daily   sodium chloride flush  10-40 mL Intracatheter Q12H   thiamine  100 mg Oral Daily

## 2022-03-30 DIAGNOSIS — T796XXA Traumatic ischemia of muscle, initial encounter: Secondary | ICD-10-CM | POA: Diagnosis not present

## 2022-03-30 LAB — RENAL FUNCTION PANEL
Albumin: 2.2 g/dL — ABNORMAL LOW (ref 3.5–5.0)
Anion gap: 12 (ref 5–15)
BUN: 81 mg/dL — ABNORMAL HIGH (ref 6–20)
CO2: 17 mmol/L — ABNORMAL LOW (ref 22–32)
Calcium: 7.4 mg/dL — ABNORMAL LOW (ref 8.9–10.3)
Chloride: 96 mmol/L — ABNORMAL LOW (ref 98–111)
Creatinine, Ser: 10.58 mg/dL — ABNORMAL HIGH (ref 0.61–1.24)
GFR, Estimated: 6 mL/min — ABNORMAL LOW (ref >60)
Glucose, Bld: 118 mg/dL — ABNORMAL HIGH (ref 70–99)
Phosphorus: 6.1 mg/dL — ABNORMAL HIGH (ref 2.5–4.6)
Potassium: 4.9 mmol/L (ref 3.5–5.1)
Sodium: 125 mmol/L — ABNORMAL LOW (ref 135–145)

## 2022-03-30 LAB — CBC WITH DIFFERENTIAL/PLATELET
Abs Immature Granulocytes: 0 10*3/uL (ref 0.00–0.07)
Basophils Absolute: 0 10*3/uL (ref 0.0–0.1)
Basophils Relative: 0 %
Eosinophils Absolute: 0.1 10*3/uL (ref 0.0–0.5)
Eosinophils Relative: 1 %
HCT: 21 % — ABNORMAL LOW (ref 39.0–52.0)
Hemoglobin: 7.2 g/dL — ABNORMAL LOW (ref 13.0–17.0)
Immature Granulocytes: 0 %
Lymphocytes Relative: 7 %
Lymphs Abs: 0.9 10*3/uL (ref 0.7–4.0)
MCH: 32.9 pg (ref 26.0–34.0)
MCHC: 34.3 g/dL (ref 30.0–36.0)
MCV: 95.9 fL (ref 80.0–100.0)
Monocytes Absolute: 0.5 10*3/uL (ref 0.1–1.0)
Monocytes Relative: 4 %
Neutro Abs: 11.5 10*3/uL — ABNORMAL HIGH (ref 1.7–7.7)
Neutrophils Relative %: 88 %
Platelets: 264 10*3/uL (ref 150–400)
RBC: 2.19 MIL/uL — ABNORMAL LOW (ref 4.22–5.81)
RDW: 15.9 % — ABNORMAL HIGH (ref 11.5–15.5)
Smear Review: NORMAL
WBC: 13.1 10*3/uL — ABNORMAL HIGH (ref 4.0–10.5)
nRBC: 0 % (ref 0.0–0.2)
nRBC: 0 /100 WBC

## 2022-03-30 LAB — GLUCOSE, CAPILLARY
Glucose-Capillary: 100 mg/dL — ABNORMAL HIGH (ref 70–99)
Glucose-Capillary: 122 mg/dL — ABNORMAL HIGH (ref 70–99)
Glucose-Capillary: 98 mg/dL (ref 70–99)
Glucose-Capillary: 99 mg/dL (ref 70–99)

## 2022-03-30 LAB — CK: Total CK: 1772 U/L — ABNORMAL HIGH (ref 49–397)

## 2022-03-30 LAB — MAGNESIUM: Magnesium: 2.2 mg/dL (ref 1.7–2.4)
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
## 2022-03-30 NOTE — Progress Notes (Signed)
Received patient in bed to unit.  Alert and oriented.  Informed consent signed and in chart.   Treatment initiated: 06:32 Treatment completed: 09:45  Patient tolerated well.  Transported back to the room  Alert, without acute distress.  Hand-off given to patient'[name] nurse.   Access used: Right hemodialysis catheter Access issues: none  Total UF removed: 2L Medication([name]) given: dilaudid 1mg  07:51, norco/vicodin 09:28       03/30/22 0945  Vitals  Temp 97.7 F (36.5 C)  BP 134/68  MAP (mmHg) 84  BP Location Right Arm  BP Method Automatic  Patient Position (if appropriate) Lying  Pulse Rate (!) 102  Pulse Rate Source Monitor  ECG Heart Rate 97  Resp 17  Oxygen Therapy  SpO2 100 %  O2 Device Room Air  Pulse Oximetry Type Continuous  Oximetry Probe Site Changed No  During Treatment Monitoring  HD Safety Checks Performed Yes  Intra-Hemodialysis Comments Tolerated well;Tx completed  Dialysis Fluid Bolus Normal Saline  Bolus Amount (mL) 300 mL   [name]

## 2022-03-30 NOTE — Progress Notes (Signed)
Patient was transferred to dialysis.

## 2022-03-30 NOTE — Progress Notes (Signed)
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  CRRT started 03/21/22             - remains ~ anuric; off CRRT since 6pm 12/25.              - iHD for clearance 12/27; clotted filter x 2 so would use heparin going forward if able  - no signs of renal recovery yet given intradialytic rise in Cr, but will CTM. Next HD on Wed provided no indications arise in the meantime. Will need TDC later this week, consult for IR placed (nonurgent)--tentatively planned for Tues, discussed with IR. Will also need outpatient placement/CLIP  -cont qshift bladder scan   2.  Rhabdo             - L hand             - s/p faciotomies  - CK improved   3.  Acute CVA, acute metabolic encephalopathy             - CTH on admit with subacute b/l cerebellar infarct              - s/p TTE w/o vegetation  4. Hyponatremia  -will manage with HD, UF'ing as tolerated  5. Anemia -hgb down to 7.2 -transfuse PRN -iron profile ordered to assess for Fe vs ESA needs  6. LLE pain -workup/mgmt per primary service   Dispo: admitted, likely for CIR post acute needs  Gean Quint, MD [facility]  Subjective:    Patient seen and examined bedside. Had HD earlier today, net UF 2L. Has some cramping but otherwise tolerated treatment. Has ongoing pain. Family at bedside. He does report that he feels like he is making more urine.   Objective:   BP 132/79 (BP Location: Right Arm)   Pulse 96   Temp 98.5 F (36.9 C) (Oral)   Resp 16   Wt 89.1 kg   SpO2 100%   BMI 30.77 kg/m   Intake/Output Summary (Last 24 hours) at 03/30/2022 1319 Last data filed at 03/30/2022 1003 Gross per 24 hour  Intake --  Output 2200 ml  Net -2200 ml   Weight change:   Physical Exam: Gen: NAD CV: RRR Resp: CTA b/l Abd: soft Ext: no sig edema b/l LE, 2+ DP b/l, LUE dressing in place Neuro: awake, alert Dialysis access: RIJ temp HD line c/d/i  Imaging: No  results found.  Labs: BMET Recent Labs  Lab 03/24/22 1617 03/25/22 0539 03/26/22 0421 03/27/22 0334 03/27/22 1421 03/28/22 0420 03/29/22 0303 03/30/22 0146  NA 133* 130* 128* 129* 130* 129* 128* 125*  K 3.8 3.8 3.4* 4.1 4.9 4.4 4.4 4.9  CL 100 98 99 97* 95* 97* 98 96*  CO2 26 22 22 24   --  24 20* 17*  GLUCOSE 102* 102* 113* 103* 97 113* 121* 118*  BUN 22* 34* 56* 42* 48* 62* 73* 81*  CREATININE 3.03* 4.50* 7.18* 6.10* 7.90* 8.47* 9.63* 10.58*  CALCIUM 7.6* 7.6* 7.6* 8.0*  --  7.6* 7.4* 7.4*  PHOS 3.1 2.9 3.3 2.8  --  4.3 5.2* 6.1*   CBC Recent Labs  Lab 03/24/22 0353 03/26/22 0419 03/27/22 1421 03/28/22 0420 03/30/22 0146  WBC 14.1* 13.5*  --  13.2* 13.1*  NEUTROABS  --   --   --  7.3 11.5*  HGB 10.6* 8.8* 9.9* 7.9* 7.2*  HCT 29.9* 25.0* 29.0* 22.2*  21.0*  MCV 92.9 92.3  --  94.1 95.9  PLT 113* 147*  --  204 264    Medications:     amLODipine  5 mg Oral Daily   aspirin EC  81 mg Oral Daily   Chlorhexidine Gluconate Cloth  6 each Topical q morning   Chlorhexidine Gluconate Cloth  6 each Topical Q0165   folic acid  1 mg Oral Daily   heparin  5,000 Units Subcutaneous Q8H   hydrALAZINE  25 mg Oral Q8H   insulin aspart  0-9 Units Subcutaneous Q4H   lidocaine  1 patch Transdermal QHS   multivitamin with minerals  1 tablet Oral Daily   sodium chloride flush  10-40 mL Intracatheter Q12H   thiamine  100 mg Oral Daily

## 2022-03-31 DIAGNOSIS — T796XXA Traumatic ischemia of muscle, initial encounter: Secondary | ICD-10-CM | POA: Diagnosis not present

## 2022-03-31 LAB — GLUCOSE, CAPILLARY
Glucose-Capillary: 88 mg/dL (ref 70–99)
Glucose-Capillary: 113 mg/dL — ABNORMAL HIGH (ref 70–99)
Glucose-Capillary: 121 mg/dL — ABNORMAL HIGH (ref 70–99)
Glucose-Capillary: 105 mg/dL — ABNORMAL HIGH (ref 70–99)

## 2022-03-31 LAB — RENAL FUNCTION PANEL
Albumin: 2.2 g/dL — ABNORMAL LOW (ref 3.5–5.0)
Anion gap: 12 (ref 5–15)
BUN: 55 mg/dL — ABNORMAL HIGH (ref 6–20)
CO2: 24 mmol/L (ref 22–32)
Calcium: 7.5 mg/dL — ABNORMAL LOW (ref 8.9–10.3)
Chloride: 89 mmol/L — ABNORMAL LOW (ref 98–111)
Creatinine, Ser: 7.22 mg/dL — ABNORMAL HIGH (ref 0.61–1.24)
GFR, Estimated: 9 mL/min — ABNORMAL LOW (ref >60)
Glucose, Bld: 100 mg/dL — ABNORMAL HIGH (ref 70–99)
Phosphorus: 5.6 mg/dL — ABNORMAL HIGH (ref 2.5–4.6)
Potassium: 5 mmol/L (ref 3.5–5.1)
Sodium: 125 mmol/L — ABNORMAL LOW (ref 135–145)

## 2022-03-31 LAB — MAGNESIUM: Magnesium: 2 mg/dL (ref 1.7–2.4)

## 2022-03-31 LAB — IRON AND TIBC
Iron: 53 ug/dL (ref 45–182)
Saturation Ratios: 21 % (ref 17.9–39.5)
TIBC: 251 ug/dL (ref 250–450)
UIBC: 198 ug/dL

## 2022-03-31 LAB — FERRITIN: Ferritin: 510 ng/mL — ABNORMAL HIGH (ref 24–336)

## 2022-03-31 MED ORDER — DARBEPOETIN ALFA 100 MCG/0.5ML IJ SOSY
100.0000 ug | PREFILLED_SYRINGE | Freq: Once | INTRAMUSCULAR | Status: AC
  Administered 2022-03-31: 100 ug via SUBCUTANEOUS
  Filled 2022-03-31: qty 0.5

## 2022-03-31 MED ORDER — HYDROMORPHONE HCL 1 MG/ML IJ SOLN
1.0000 mg | INTRAMUSCULAR | Status: DC | PRN
  Administered 2022-03-31 – 2022-04-04 (×26): 1 mg via INTRAVENOUS
  Filled 2022-03-31 (×27): qty 1

## 2022-03-31 MED ORDER — VANCOMYCIN HCL IN DEXTROSE 1-5 GM/200ML-% IV SOLN: 1000.0000 mg | Freq: Once | INTRAVENOUS | Status: AC

## 2022-03-31 MED ORDER — SODIUM CHLORIDE 0.9 % IV SOLN
250.0000 mg | Freq: Every day | INTRAVENOUS | Status: AC
  Administered 2022-03-31 – 2022-04-01 (×2): 250 mg via INTRAVENOUS
  Filled 2022-03-31 (×2): qty 20
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
## 2022-03-31 NOTE — Progress Notes (Signed)
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  CRRT from 12/22-12/25/2023.  No sign of renal recovery so far with high intradialytic creatinine level.  Status post HD yesterday with 2 L ultrafiltration, tolerated well.  Plan for [facility] placement by IR on 1/2 and next next dialysis on 1/3. Continue daily lab monitoring and strict ins and out for renal recovery.  #  Rhabdo             - L hand             - s/p faciotomies             - CK improved   # Acute CVA, acute metabolic encephalopathy             - CTH on admit with subacute b/l cerebellar infarct              - s/p TTE w/o vegetation   # Hyponatremia             -will manage with HD, ultrafiltrate as tolerated.   # Anemia due to acute illness, I will order IV iron and a dose of Aranesp.  #LLE pain/mgmt per primary service  Subjective: Seen and examined at bedside.  Tolerated HD well yesterday with 2 L ultrafiltration, urine output is recorded only 200 cc.  Complaining of extremities pain.  No other new event. Objective Vital signs in last 24 hours: Vitals:   03/30/22 1706 03/30/22 1923 03/31/22 0346 03/31/22 0733  BP: 134/86 117/70 (!) 110/95 124/84  Pulse: 94 88 92 91  Resp:  19 20   Temp:  97.7 F (36.5 C) 98.5 F (36.9 C)   TempSrc:  Axillary Oral   SpO2: 100% 100% 98% 98%  Weight:   90.9 kg    Weight change:   Intake/Output Summary (Last 24 hours) at 03/31/2022 0942 Last data filed at 03/31/2022 0500 Gross per 24 hour  Intake --  Output 2200 ml  Net -2200 ml       Labs: RENAL PANEL Recent Labs    03/22/22 0325 03/22/22 1554 03/23/22 0418 03/23/22 0420 03/23/22 1617 03/24/22 0353 03/24/22 1617 03/25/22 0539 03/26/22 0419 03/26/22 0421 03/27/22 0334 03/27/22 1421 03/28/22 0420 03/29/22 0303 03/30/22 0146 03/31/22 0412  NA 138   < >  --    < > 135 133* 133* 130*  --  128* 129* 130* 129* 128* 125* 125*  K 4.0   < >  --     < > 4.0 4.0 3.8 3.8  --  3.4* 4.1 4.9 4.4 4.4 4.9 5.0  CL 100   < >  --    < > 96* 100 100 98  --  99 97* 95* 97* 98 96* 89*  CO2 25   < >  --    < > 26 24 26 22   --  22 24  --  24 20* 17* 24  GLUCOSE 127*   < >  --    < > 101* 99 102* 102*  --  113* 103* 97 113* 121* 118* 100*  BUN 30*   < >  --    < > 23* 21* 22* 34*  --  56* 42* 48* 62* 73* 81* 55*  CREATININE 5.44*   < >  --    < > 3.78* 3.17* 3.03* 4.50*  --  7.18* 6.10* 7.90* 8.47* 9.63* 10.58* 7.22*  CALCIUM 7.0*   < >  --    < >  7.6* 7.7* 7.6* 7.6*  --  7.6* 8.0*  --  7.6* 7.4* 7.4* 7.5*  MG 2.0  --  2.1  --   --  2.3  --  2.4 2.7*  --  2.3  --  2.3 2.2 2.2 2.0  PHOS 5.8*   < >  --    < > 3.8 2.6 3.1 2.9  --  3.3 2.8  --  4.3 5.2* 6.1* 5.6*  ALBUMIN 2.4*   < > 2.1*   < > 2.4* 2.2* 2.2* 2.2* 2.2* 2.2* 2.5*  --  2.3* 2.2* 2.2* 2.2*   < > = values in this interval not displayed.     Liver Function Tests: Recent Labs  Lab 03/26/22 0419 03/26/22 0421 03/28/22 0420 03/29/22 0303 03/30/22 0146 03/31/22 0412  AST 299*  --  121*  --   --   --   ALT 125*  --  76*  --   --   --   ALKPHOS 57  --  44  --   --   --   BILITOT 1.1  --  0.8  --   --   --   PROT 4.4*  --  4.6*  --   --   --   ALBUMIN 2.2*   < > 2.3* 2.2* 2.2* 2.2*   < > = values in this interval not displayed.   No results for input(s): "LIPASE", "AMYLASE" in the last 168 hours. No results for input(s): "AMMONIA" in the last 168 hours. CBC: Recent Labs    03/24/22 0353 03/26/22 0419 03/27/22 1421 03/28/22 0420 03/30/22 0146 03/31/22 0412  HGB 10.6* 8.8* 9.9* 7.9* 7.2*  --   MCV 92.9 92.3  --  94.1 95.9  --   FERRITIN  --   --   --   --   --  510*  TIBC  --   --   --   --   --  251  IRON  --   --   --   --   --  53    Cardiac Enzymes: Recent Labs  Lab 03/26/22 0419 03/28/22 0420 03/30/22 0146  CKTOTAL 9,308* 3,371* 1,772*   CBG: Recent Labs  Lab 03/30/22 1051 03/30/22 1707 03/30/22 1932 03/30/22 2338 03/31/22 0743  GLUCAP 98 100* 99 122* 88     Iron Studies:  Recent Labs    03/31/22 0412  IRON 53  TIBC 251  FERRITIN 510*   Studies/Results: No results found.  Medications: Infusions:  sodium chloride Stopped (03/25/22 0345)   sodium chloride 100 mL/hr at 03/30/22 0517   anticoagulant sodium citrate     [START ON 04/01/2022] vancomycin      Scheduled Medications:  amLODipine  5 mg Oral Daily   aspirin EC  81 mg Oral Daily   Chlorhexidine Gluconate Cloth  6 each Topical q morning   Chlorhexidine Gluconate Cloth  6 each Topical G8366   folic acid  1 mg Oral Daily   heparin  5,000 Units Subcutaneous Q8H   hydrALAZINE  25 mg Oral Q8H   insulin aspart  0-9 Units Subcutaneous Q4H   lidocaine  1 patch Transdermal QHS   multivitamin with minerals  1 tablet Oral Daily   sodium chloride flush  10-40 mL Intracatheter Q12H   thiamine  100 mg Oral Daily    have reviewed scheduled and prn medications.  Physical Exam: General:NAD, comfortable Heart:RRR, s1s2 nl Lungs:clear b/l, no crackle Abdomen:soft, Non-tender, non-distended Extremities:No edema, left  upper extremity dressing in place Dialysis Access: Right IJ temporary HD catheter in place  Eisa Necaise Prasad Adra Shepler 03/31/2022,9:42 AM  LOS: 11 days

## 2022-04-01 ENCOUNTER — Inpatient Hospital Stay (HOSPITAL_COMMUNITY): Payer: Medicaid Other

## 2022-04-01 ENCOUNTER — Encounter (HOSPITAL_COMMUNITY): Payer: Self-pay | Admitting: Orthopedic Surgery

## 2022-04-01 DIAGNOSIS — T796XXA Traumatic ischemia of muscle, initial encounter: Secondary | ICD-10-CM | POA: Diagnosis not present

## 2022-04-01 HISTORY — PX: IR US GUIDE VASC ACCESS RIGHT: IMG2390

## 2022-04-01 HISTORY — PX: IR FLUORO GUIDE CV LINE RIGHT: IMG2283

## 2022-04-01 LAB — CBC
HCT: 23.7 % — ABNORMAL LOW (ref 39.0–52.0)
Hemoglobin: 8.5 g/dL — ABNORMAL LOW (ref 13.0–17.0)
MCH: 32.8 pg (ref 26.0–34.0)
MCHC: 35.9 g/dL (ref 30.0–36.0)
MCV: 91.5 fL (ref 80.0–100.0)
Platelets: 348 10*3/uL (ref 150–400)
RBC: 2.59 MIL/uL — ABNORMAL LOW (ref 4.22–5.81)
RDW: 15.8 % — ABNORMAL HIGH (ref 11.5–15.5)
WBC: 9.6 10*3/uL (ref 4.0–10.5)
nRBC: 0 % (ref 0.0–0.2)

## 2022-04-01 LAB — CBC WITH DIFFERENTIAL/PLATELET
Abs Immature Granulocytes: 0.34 10*3/uL — ABNORMAL HIGH (ref 0.00–0.07)
Basophils Absolute: 0.1 10*3/uL (ref 0.0–0.1)
Basophils Relative: 1 %
Eosinophils Absolute: 0.2 10*3/uL (ref 0.0–0.5)
Eosinophils Relative: 2 %
HCT: 19.6 % — ABNORMAL LOW (ref 39.0–52.0)
Hemoglobin: 6.6 g/dL — CL (ref 13.0–17.0)
Immature Granulocytes: 4 %
Lymphocytes Relative: 11 %
Lymphs Abs: 1 10*3/uL (ref 0.7–4.0)
MCH: 32.5 pg (ref 26.0–34.0)
MCHC: 33.7 g/dL (ref 30.0–36.0)
MCV: 96.6 fL (ref 80.0–100.0)
Monocytes Absolute: 1 10*3/uL (ref 0.1–1.0)
Monocytes Relative: 11 %
Neutro Abs: 7 10*3/uL (ref 1.7–7.7)
Neutrophils Relative %: 71 %
Platelets: 312 10*3/uL (ref 150–400)
RBC: 2.03 MIL/uL — ABNORMAL LOW (ref 4.22–5.81)
RDW: 15.1 % (ref 11.5–15.5)
WBC: 9.7 10*3/uL (ref 4.0–10.5)
nRBC: 0 % (ref 0.0–0.2)

## 2022-04-01 LAB — RENAL FUNCTION PANEL
Albumin: 2.4 g/dL — ABNORMAL LOW (ref 3.5–5.0)
Anion gap: 12 (ref 5–15)
BUN: 67 mg/dL — ABNORMAL HIGH (ref 6–20)
CO2: 22 mmol/L (ref 22–32)
Calcium: 7.7 mg/dL — ABNORMAL LOW (ref 8.9–10.3)
Chloride: 91 mmol/L — ABNORMAL LOW (ref 98–111)
Creatinine, Ser: 8.18 mg/dL — ABNORMAL HIGH (ref 0.61–1.24)
GFR, Estimated: 8 mL/min — ABNORMAL LOW (ref >60)
Glucose, Bld: 104 mg/dL — ABNORMAL HIGH (ref 70–99)
Phosphorus: 7.4 mg/dL — ABNORMAL HIGH (ref 2.5–4.6)
Potassium: 5.2 mmol/L — ABNORMAL HIGH (ref 3.5–5.1)
Sodium: 125 mmol/L — ABNORMAL LOW (ref 135–145)

## 2022-04-01 LAB — GLUCOSE, CAPILLARY
Glucose-Capillary: 105 mg/dL — ABNORMAL HIGH (ref 70–99)
Glucose-Capillary: 105 mg/dL — ABNORMAL HIGH (ref 70–99)
Glucose-Capillary: 106 mg/dL — ABNORMAL HIGH (ref 70–99)
Glucose-Capillary: 108 mg/dL — ABNORMAL HIGH (ref 70–99)
Glucose-Capillary: 119 mg/dL — ABNORMAL HIGH (ref 70–99)
Glucose-Capillary: 121 mg/dL — ABNORMAL HIGH (ref 70–99)
Glucose-Capillary: 98 mg/dL (ref 70–99)
Glucose-Capillary: 99 mg/dL (ref 70–99)

## 2022-04-01 LAB — PREPARE RBC (CROSSMATCH)

## 2022-04-01 LAB — CK: Total CK: 906 U/L — ABNORMAL HIGH (ref 49–397)

## 2022-04-01 LAB — ABO/RH: ABO/RH(D): A POS

## 2022-04-01 LAB — MAGNESIUM: Magnesium: 2.1 mg/dL (ref 1.7–2.4)

## 2022-04-01 MED ORDER — BISACODYL 10 MG RE SUPP
10.0000 mg | Freq: Every day | RECTAL | Status: DC
  Administered 2022-04-01 – 2022-04-08 (×6): 10 mg via RECTAL
  Filled 2022-04-01 (×9): qty 1

## 2022-04-01 MED ORDER — VANCOMYCIN HCL IN DEXTROSE 1-5 GM/200ML-% IV SOLN
INTRAVENOUS | Status: AC
  Administered 2022-04-01: 1000 mg via INTRAVENOUS
  Filled 2022-04-01: qty 200

## 2022-04-01 MED ORDER — MIDAZOLAM HCL 2 MG/2ML IJ SOLN
INTRAMUSCULAR | Status: AC
  Filled 2022-04-01: qty 2

## 2022-04-01 MED ORDER — SODIUM ZIRCONIUM CYCLOSILICATE 10 G PO PACK
10.0000 g | PACK | Freq: Once | ORAL | Status: AC
  Administered 2022-04-01: 10 g via ORAL
  Filled 2022-04-01: qty 1

## 2022-04-01 MED ORDER — CHLORHEXIDINE GLUCONATE CLOTH 2 % EX PADS
6.0000 | MEDICATED_PAD | Freq: Every day | CUTANEOUS | Status: DC
  Administered 2022-04-01 – 2022-04-02 (×2): 6 via TOPICAL

## 2022-04-01 MED ORDER — MIDAZOLAM HCL 2 MG/2ML IJ SOLN
INTRAMUSCULAR | Status: DC | PRN
  Administered 2022-04-01: 1 mg via INTRAVENOUS
  Administered 2022-04-01: .5 mg via INTRAVENOUS

## 2022-04-01 MED ORDER — DIPHENHYDRAMINE HCL 50 MG/ML IJ SOLN
INTRAMUSCULAR | Status: AC
  Filled 2022-04-01: qty 1

## 2022-04-01 MED ORDER — FENTANYL CITRATE (PF) 100 MCG/2ML IJ SOLN
INTRAMUSCULAR | Status: DC | PRN
  Administered 2022-04-01 (×3): 25 ug via INTRAVENOUS

## 2022-04-01 MED ORDER — HEPARIN SODIUM (PORCINE) 1000 UNIT/ML IJ SOLN
INTRAMUSCULAR | Status: AC
  Administered 2022-04-01: 2.6 mL
  Filled 2022-04-01: qty 10

## 2022-04-01 MED ORDER — SODIUM CHLORIDE 0.9% IV SOLUTION: Freq: Once | INTRAVENOUS | Status: AC

## 2022-04-01 MED ORDER — DIPHENHYDRAMINE HCL 50 MG/ML IJ SOLN
INTRAMUSCULAR | Status: DC | PRN
  Administered 2022-04-01 (×2): 25 mg via INTRAVENOUS

## 2022-04-01 MED ORDER — GELATIN ABSORBABLE 12-7 MM EX MISC
CUTANEOUS | Status: AC
  Filled 2022-04-01: qty 1

## 2022-04-01 MED ORDER — LIDOCAINE HCL 1 % IJ SOLN
INTRAMUSCULAR | Status: AC
  Administered 2022-04-01: 20 mL
  Filled 2022-04-01: qty 20

## 2022-04-01 MED ORDER — FENTANYL CITRATE (PF) 100 MCG/2ML IJ SOLN
INTRAMUSCULAR | Status: AC
  Filled 2022-04-01: qty 2

## 2022-04-01 NOTE — Anesthesia Postprocedure Evaluation (Signed)
Anesthesia Post Note  Patient: [name] [name]  Procedure(s) Performed: LEFT FOREARM IRRIGATION AND DEBRIDEMENT of fasciotomy wounds including skin subcutaneous tissues (Left: Arm Lower) CLOSURE of left dorsal fasciotomy wound and proximal and distal portion of the volar fasciotomy wound (Left: Arm Lower)     Patient location during evaluation: PACU Anesthesia Type: General Level of consciousness: sedated and patient cooperative Pain management: pain level controlled Vital Signs Assessment: post-procedure vital signs reviewed and stable Respiratory status: spontaneous breathing Cardiovascular status: stable Anesthetic complications: no   No notable events documented.  Last Vitals:  Vitals:   04/01/22 1503 04/01/22 1601  BP: (!) 144/79 (!) 128/97  Pulse: (!) 103 96  Resp: 16 18  Temp: 36.7 C 36.5 C  SpO2: 100% 99%    Last Pain:  Vitals:   04/01/22 1503  TempSrc: Oral  PainSc:                  Nolon Nations

## 2022-04-01 NOTE — Progress Notes (Signed)
Subjective: 5 Days Post-Op Procedure(s) (LRB): LEFT FOREARM IRRIGATION AND DEBRIDEMENT of fasciotomy wounds including skin subcutaneous tissues (Left) CLOSURE of left dorsal fasciotomy wound and proximal and distal portion of the volar fasciotomy wound (Left) Patient reports pain in left arm and leg.  The leg is what is bothering him most at this point.  States the oral hydrocodone is not very effective.  Has been seen by plastic surgery regarding the left forearm wound.  Objective: Vital signs in last 24 hours: Temp:  [97.6 F (36.4 C)-98.6 F (37 C)] 97.7 F (36.5 C) (01/02 1601) Pulse Rate:  [91-103] 96 (01/02 1601) Resp:  [11-20] 18 (01/02 1601) BP: (126-152)/(67-97) 128/97 (01/02 1601) SpO2:  [97 %-100 %] 99 % (01/02 1601)  Intake/Output from previous day: 01/01 0701 - 01/02 0700 In: 480 [P.O.:480] Out: 450 [Urine:450] Intake/Output this shift: Total I/O In: 300 [Blood:300] Out: 850 [Urine:850]  Recent Labs    03/30/22 0146 04/01/22 0336 04/01/22 1610  HGB 7.2* 6.6* 8.5*   Recent Labs    04/01/22 0336 04/01/22 1610  WBC 9.7 9.6  RBC 2.03* 2.59*  HCT 19.6* 23.7*  PLT 312 348   Recent Labs    03/31/22 0412 04/01/22 0336  NA 125* 125*  K 5.0 5.2*  CL 89* 91*  CO2 24 22  BUN 55* 67*  CREATININE 7.22* 8.18*  GLUCOSE 100* 104*  CALCIUM 7.5* 7.7*   No results for input(s): "LABPT", "INR" in the last 72 hours.  Intact sensation and capillary refill in all digits and on radial dorsum of hand.  Able to flex fingers.  More weakness with finger and wrist extension than flexion.  Dressing clean and intact.  Serous drainage.  Left leg compartments soft.  Able to lift leg off bed.   Assessment/Plan: 5 Days Post-Op Procedure(s) (LRB): LEFT FOREARM IRRIGATION AND DEBRIDEMENT of fasciotomy wounds including skin subcutaneous tissues (Left) CLOSURE of left dorsal fasciotomy wound and proximal and distal portion of the volar fasciotomy wound (Left) S/p fasciotomies.   Seen by plastic surgery for wound management planning.  Their assistance is appreciated.  Will ask therapy to work on motion exercises and splinting left hand.  Will follow.   Leanora Cover 04/01/2022, 6:15 PM

## 2022-04-01 NOTE — Progress Notes (Signed)
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  CRRT from 12/22-12/25/2023.  No sign of renal recovery so far with high intradialytic creatinine level.  Status post HD on 12/31 with 2 L ultrafiltration, tolerated well.  Plan for [facility] placement by IR today and and next next dialysis on 1/3. Continue daily lab monitoring and strict ins and out for renal recovery.  #  Rhabdo             - L hand             - s/p faciotomies             - CK improved   # Acute CVA, acute metabolic encephalopathy             - CTH on admit with subacute b/l cerebellar infarct              - s/p TTE w/o vegetation   # Hyponatremia             -will manage with HD, ultrafiltrate as tolerated.   # Anemia due to acute illness, received Aranesp on 1/1 and started IV iron.  Monitor hemoglobin.   #LLE pain and numbness: Management per primary team.  Subjective: Seen and examined at bedside.  Complaining of leg numbness and pain.  Urine output is only for a 50 cc recorded.  Denies nausea, vomiting, chest pain or shortness of breath.  Plan for [facility] exchange today.  Objective Vital signs in last 24 hours: Vitals:   03/31/22 1547 03/31/22 2026 04/01/22 0602 04/01/22 0829  BP: 134/85 126/87 (!) 152/69 137/74  Pulse: (!) 102 94 97 94  Resp:  17 17 18   Temp:  98.2 F (36.8 C) 98.6 F (37 C) 97.7 F (36.5 C)  TempSrc:    Oral  SpO2: 100% 99% 100% 99%  Weight:       Weight change:   Intake/Output Summary (Last 24 hours) at 04/01/2022 0910 Last data filed at 04/01/2022 0236 Gross per 24 hour  Intake 480 ml  Output 450 ml  Net 30 ml        Labs: RENAL PANEL Recent Labs    03/23/22 0418 03/23/22 0420 03/24/22 0353 03/24/22 1617 03/25/22 0539 03/26/22 0419 03/26/22 0421 03/27/22 0334 03/27/22 1421 03/28/22 0420 03/29/22 0303 03/30/22 0146 03/31/22 0412 04/01/22 0336  NA  --    < > 133* 133* 130*  --  128* 129* 130* 129*  128* 125* 125* 125*  K  --    < > 4.0 3.8 3.8  --  3.4* 4.1 4.9 4.4 4.4 4.9 5.0 5.2*  CL  --    < > 100 100 98  --  99 97* 95* 97* 98 96* 89* 91*  CO2  --    < > 24 26 22   --  22 24  --  24 20* 17* 24 22  GLUCOSE  --    < > 99 102* 102*  --  113* 103* 97 113* 121* 118* 100* 104*  BUN  --    < > 21* 22* 34*  --  56* 42* 48* 62* 73* 81* 55* 67*  CREATININE  --    < > 3.17* 3.03* 4.50*  --  7.18* 6.10* 7.90* 8.47* 9.63* 10.58* 7.22* 8.18*  CALCIUM  --    < > 7.7* 7.6* 7.6*  --  7.6* 8.0*  --  7.6* 7.4* 7.4*  7.5* 7.7*  MG 2.1  --  2.3  --  2.4 2.7*  --  2.3  --  2.3 2.2 2.2 2.0 2.1  PHOS  --    < > 2.6 3.1 2.9  --  3.3 2.8  --  4.3 5.2* 6.1* 5.6* 7.4*  ALBUMIN 2.1*   < > 2.2* 2.2* 2.2* 2.2* 2.2* 2.5*  --  2.3* 2.2* 2.2* 2.2* 2.4*   < > = values in this interval not displayed.      Liver Function Tests: Recent Labs  Lab 03/26/22 0419 03/26/22 0421 03/28/22 0420 03/29/22 0303 03/30/22 0146 03/31/22 0412 04/01/22 0336  AST 299*  --  121*  --   --   --   --   ALT 125*  --  76*  --   --   --   --   ALKPHOS 57  --  44  --   --   --   --   BILITOT 1.1  --  0.8  --   --   --   --   PROT 4.4*  --  4.6*  --   --   --   --   ALBUMIN 2.2*   < > 2.3*   < > 2.2* 2.2* 2.4*   < > = values in this interval not displayed.    No results for input(s): "LIPASE", "AMYLASE" in the last 168 hours. No results for input(s): "AMMONIA" in the last 168 hours. CBC: Recent Labs    03/26/22 0419 03/27/22 1421 03/28/22 0420 03/30/22 0146 03/31/22 0412 04/01/22 0336  HGB 8.8* 9.9* 7.9* 7.2*  --  6.6*  MCV 92.3  --  94.1 95.9  --  96.6  FERRITIN  --   --   --   --  510*  --   TIBC  --   --   --   --  251  --   IRON  --   --   --   --  53  --      Cardiac Enzymes: Recent Labs  Lab 03/26/22 0419 03/28/22 0420 03/30/22 0146  CKTOTAL 9,308* 3,371* 1,772*    CBG: Recent Labs  Lab 03/31/22 1208 03/31/22 1615 03/31/22 2047 04/01/22 0005 04/01/22 0826  GLUCAP 113* 121* 105* 108* 99     Iron  Studies:  Recent Labs    03/31/22 0412  IRON 53  TIBC 251  FERRITIN 510*    Studies/Results: No results found.  Medications: Infusions:  sodium chloride Stopped (03/25/22 0345)   sodium chloride 100 mL/hr at 03/30/22 0517   anticoagulant sodium citrate     ferric gluconate (FERRLECIT) IVPB 250 mg (03/31/22 1426)   vancomycin      Scheduled Medications:  sodium chloride   Intravenous Once   amLODipine  5 mg Oral Daily   aspirin EC  81 mg Oral Daily   Chlorhexidine Gluconate Cloth  6 each Topical q morning   Chlorhexidine Gluconate Cloth  6 each Topical I4332   folic acid  1 mg Oral Daily   gelatin adsorbable       heparin  5,000 Units Subcutaneous Q8H   heparin sodium (porcine)       hydrALAZINE  25 mg Oral Q8H   insulin aspart  0-9 Units Subcutaneous Q4H   lidocaine  1 patch Transdermal QHS   lidocaine       multivitamin with minerals  1 tablet Oral Daily   sodium chloride flush  10-40 mL Intracatheter  Q12H   thiamine  100 mg Oral Daily    have reviewed scheduled and prn medications.  Physical Exam: General:NAD, comfortable Heart:RRR, s1s2 nl Lungs:clear b/l, no crackle Abdomen:soft, Non-tender, non-distended Extremities:No edema, left upper extremity dressing in place Dialysis Access: Right IJ temporary HD catheter in place  [name] [name] [name] [name] 04/01/2022,9:10 AM  LOS: 12 days

## 2022-04-01 NOTE — Progress Notes (Signed)
Inpatient Rehab Admissions Coordinator:   Attempted x2 to see pt for CIR assessment.  Too lethargic to participate in conversation.  Will f/u tomorrow.   Shann Medal, PT, DPT Admissions Coordinator [phone] 04/01/22  12:34 PM
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
## 2022-04-01 NOTE — Procedures (Signed)
Interventional Radiology Procedure:   Indications: AKI  Procedure: Tunneled dialysis catheter placement  Findings: Right jugular Palindrome, 19 cm, tip at SVC/RA junction  Complications: No immediate complications noted.     EBL: Minimal  Plan: Dialysis catheter is ready to use.    [name] [name] R. Anselm Pancoast, MD  Pager: [phone]

## 2022-04-01 NOTE — Progress Notes (Signed)
PT Cancellation Note  Patient Details Name: [name] [name] MRN: [id number] DOB: 08/30/1980   Cancelled Treatment:    Reason Eval/Treat Not Completed: Patient at procedure or test/unavailable (initiated session to don AFO however transport arrived for IR terminating session)   [name] 04/01/2022, 8:56 AM [place] Males, [place] Office: [phone]
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
## 2022-04-01 NOTE — Progress Notes (Signed)
Patients blood transfusion is complete. No reactions at this time vitals are stable

## 2022-04-01 NOTE — Progress Notes (Signed)
PT Cancellation Note  Patient Details Name: [name] [name] MRN: [id number] DOB: 01-05-1981   Cancelled Treatment:    Reason Eval/Treat Not Completed: Fatigue/lethargy limiting ability to participate (pt returned from IR but too lethargic to participate at this time)   Paul Smiths 04/01/2022, 10:56 AM [place] Office: [phone]
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
## 2022-04-02 ENCOUNTER — Inpatient Hospital Stay (HOSPITAL_COMMUNITY): Payer: Medicaid Other

## 2022-04-02 DIAGNOSIS — T796XXA Traumatic ischemia of muscle, initial encounter: Secondary | ICD-10-CM | POA: Diagnosis not present

## 2022-04-02 LAB — RENAL FUNCTION PANEL
Albumin: 2.5 g/dL — ABNORMAL LOW (ref 3.5–5.0)
Anion gap: 10 (ref 5–15)
BUN: 34 mg/dL — ABNORMAL HIGH (ref 6–20)
CO2: 26 mmol/L (ref 22–32)
Calcium: 7.6 mg/dL — ABNORMAL LOW (ref 8.9–10.3)
Chloride: 93 mmol/L — ABNORMAL LOW (ref 98–111)
Creatinine, Ser: 4.35 mg/dL — ABNORMAL HIGH (ref 0.61–1.24)
GFR, Estimated: 17 mL/min — ABNORMAL LOW (ref >60)
Glucose, Bld: 102 mg/dL — ABNORMAL HIGH (ref 70–99)
Phosphorus: 4.1 mg/dL (ref 2.5–4.6)
Potassium: 3.8 mmol/L (ref 3.5–5.1)
Sodium: 129 mmol/L — ABNORMAL LOW (ref 135–145)

## 2022-04-02 LAB — TYPE AND SCREEN
ABO/RH(D): A POS
Antibody Screen: NEGATIVE
Unit division: 0

## 2022-04-02 LAB — BPAM RBC
Blood Product Expiration Date: 202401212359
ISSUE DATE / TIME: 202401021043
Unit Type and Rh: 6200

## 2022-04-02 LAB — PROTEIN, TOTAL: Total Protein: 5.1 g/dL — ABNORMAL LOW (ref 6.5–8.1)

## 2022-04-02 LAB — BILIRUBIN, DIRECT: Bilirubin, Direct: 0.1 mg/dL (ref 0.0–0.2)

## 2022-04-02 LAB — ALT: ALT: 11 U/L (ref 0–44)

## 2022-04-02 LAB — MAGNESIUM: Magnesium: 1.6 mg/dL — ABNORMAL LOW (ref 1.7–2.4)

## 2022-04-02 LAB — GLUCOSE, CAPILLARY
Glucose-Capillary: 124 mg/dL — ABNORMAL HIGH (ref 70–99)
Glucose-Capillary: 105 mg/dL — ABNORMAL HIGH (ref 70–99)
Glucose-Capillary: 134 mg/dL — ABNORMAL HIGH (ref 70–99)
Glucose-Capillary: 114 mg/dL — ABNORMAL HIGH (ref 70–99)
Glucose-Capillary: 121 mg/dL — ABNORMAL HIGH (ref 70–99)

## 2022-04-02 LAB — ALKALINE PHOSPHATASE: Alkaline Phosphatase: 44 U/L (ref 38–126)

## 2022-04-02 LAB — BILIRUBIN, TOTAL: Total Bilirubin: 0.5 mg/dL (ref 0.3–1.2)

## 2022-04-02 MED ORDER — MORPHINE SULFATE ER 15 MG PO TBCR
15.0000 mg | EXTENDED_RELEASE_TABLET | Freq: Two times a day (BID) | ORAL | Status: DC
  Administered 2022-04-02 – 2022-04-03 (×3): 15 mg via ORAL
  Filled 2022-04-02 (×3): qty 1

## 2022-04-02 MED ORDER — CYCLOBENZAPRINE HCL 10 MG PO TABS
10.0000 mg | ORAL_TABLET | Freq: Three times a day (TID) | ORAL | Status: DC | PRN
  Administered 2022-04-02 – 2022-04-07 (×10): 10 mg via ORAL
  Filled 2022-04-02 (×10): qty 1

## 2022-04-02 MED ORDER — PREGABALIN 75 MG PO CAPS
75.0000 mg | ORAL_CAPSULE | Freq: Two times a day (BID) | ORAL | Status: DC
  Administered 2022-04-02 – 2022-04-08 (×13): 75 mg via ORAL
  Filled 2022-04-02 (×13): qty 1

## 2022-04-02 NOTE — Progress Notes (Signed)
Orthopedic Tech Progress Note Patient Details:  [name] [name] 03/20/1981 [id number]  Ortho Devices Type of Ortho Device: Prafo boot/shoe Ortho Device/Splint Location: LLE Ortho Device/Splint Interventions: Ordered, Application, Adjustment   Post Interventions Patient Tolerated: Fair, Well Instructions Provided: Care of device  Janit Pagan 04/02/2022, 2:33 PM

## 2022-04-02 NOTE — Progress Notes (Signed)
Mobility Specialist Progress Note:   04/02/22 1407  Mobility  Activity Transferred from chair to bed  Level of Assistance +2 (takes two people) (ModA)  Assistive Device Other (Comment) (HHA)  LUE Weight Bearing PWB  Activity Response Tolerated fair  $Mobility charge 1 Mobility   Pt received in chair and RN requesting assistance in transfer back to bed. C/o pain and discomfort in LE. Pt left in bed with all needs met and call bell in reach.   Andrey Campanile Mobility Specialist Please contact via SecureChat or  Rehab office at [phone]
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
## 2022-04-02 NOTE — Progress Notes (Addendum)
Inpatient Rehab Coordinator Note:  I met with patient at bedside to discuss CIR recommendations and goals/expectations of CIR stay.  We reviewed 3 hrs/day of therapy, physician follow up, and average length of stay 2 weeks (dependent upon progress) with goals of supervision to mod I.  He states that he will likely go stay with his sister at discharge.  Verified updated coverage active with [facility].  I will try to contact sister to confirm caregiver support this afternoon and if confirmed will open insurance.   Addendum: unable to reach sister, phone number in chart not connected.  Will continue efforts.   Shann Medal, PT, DPT Admissions Coordinator [phone] 04/02/22  1:00 PM

## 2022-04-02 NOTE — Progress Notes (Signed)
Primary team reached out about patients left lower Extremity pain and decreased sensation.  Spoke with Dr. Stann Mainland regarding this patient and due to being down for such a long period of time had muscle necrosis and potential nerve ischemia that at that point Was irreversible.  Based on the location of his symptoms was would not benefit from any surgical intervention and decompression.  We would recommend an AFO for his foot drop, outpatient NCV/EMG, and continued watchful waiting For nerve recovery. He. Should work with therapy on trying to ambulate and work on range of motion active and passive without restrictions.

## 2022-04-02 NOTE — Progress Notes (Signed)
OT Cancellation Note  Patient Details Name: [name] [name] MRN: [id number] DOB: [birth year]   Cancelled Treatment:    Reason Eval/Treat Not Completed: Patient at procedure or test/ unavailable. Pt having ultrasound. Received clarification orders and order to fabricate splint LUE. Will return at a later time.   Tametha Banning,HILLARY 04/02/2022, 11:29 AM Maurie Boettcher, OT/L   Acute OT Clinical Specialist Acute Rehabilitation Services Pager [phone] Office [phone]
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
## 2022-04-02 NOTE — Progress Notes (Signed)
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  CRRT from 12/22-12/25/2023.  No sign of renal recovery so far with high intradialytic creatinine level.  Status post HD on 12/31 with 2 L ultrafiltration, tolerated well.  TDC placed by IR on 1/2 and received dialysis with 2 L ultrafiltration.  He also has increased urinary output.  I will monitor urine output and a.m. lab before ordering dialysis for tomorrow. Continue daily lab monitoring and strict ins and out for renal recovery.  Discussed with the social worker for possible need of dialysis set up for AKI.  #  Rhabdo             - L hand             - s/p faciotomies             - CK improved   # Acute CVA, acute metabolic encephalopathy             - CTH on admit with subacute b/l cerebellar infarct              - s/p TTE w/o vegetation   # Hyponatremia             -will manage with HD, ultrafiltrate as tolerated.   # Anemia due to acute illness, received Aranesp on 1/1 and started IV iron.  Monitor hemoglobin.   #LLE pain and numbness: Management per primary team.  Subjective: Seen and examined at bedside.  He is complaining of leg numbness and generalized body pain.  Urine output is recorded around 1.6 L.  Tolerated HD well.  No nausea, vomiting, chest pain, shortness of breath. Objective Vital signs in last 24 hours: Vitals:   04/02/22 0139 04/02/22 0149 04/02/22 0356 04/02/22 0802  BP: 139/80  130/84 (!) 142/87  Pulse: 98  96 (!) 101  Resp: 15  19 18   Temp: 98 F (36.7 C)  98 F (36.7 C) 97.8 F (36.6 C)  TempSrc: Axillary  Oral Oral  SpO2: 100%  99% 99%  Weight:  92.9 kg     Weight change:   Intake/Output Summary (Last 24 hours) at 04/02/2022 0957 Last data filed at 04/02/2022 0900 Gross per 24 hour  Intake 300 ml  Output 3570 ml  Net -3270 ml        Labs: RENAL PANEL Recent Labs    03/24/22 0353 03/24/22 1617 03/25/22 0539  03/26/22 0419 03/26/22 0421 03/27/22 0334 03/27/22 1421 03/28/22 0420 03/29/22 0303 03/30/22 0146 03/31/22 0412 04/01/22 0336 04/02/22 0230  NA 133* 133* 130*  --  128* 129* 130* 129* 128* 125* 125* 125* 129*  K 4.0 3.8 3.8  --  3.4* 4.1 4.9 4.4 4.4 4.9 5.0 5.2* 3.8  CL 100 100 98  --  99 97* 95* 97* 98 96* 89* 91* 93*  CO2 24 26 22   --  22 24  --  24 20* 17* 24 22 26   GLUCOSE 99 102* 102*  --  113* 103* 97 113* 121* 118* 100* 104* 102*  BUN 21* 22* 34*  --  56* 42* 48* 62* 73* 81* 55* 67* 34*  CREATININE 3.17* 3.03* 4.50*  --  7.18* 6.10* 7.90* 8.47* 9.63* 10.58* 7.22* 8.18* 4.35*  CALCIUM 7.7* 7.6* 7.6*  --  7.6* 8.0*  --  7.6* 7.4* 7.4* 7.5* 7.7* 7.6*  MG 2.3  --  2.4 2.7*  --  2.3  --  2.3 2.2 2.2 2.0 2.1 1.6*  PHOS 2.6 3.1 2.9  --  3.3 2.8  --  4.3 5.2* 6.1* 5.6* 7.4* 4.1  ALBUMIN 2.2* 2.2* 2.2* 2.2* 2.2* 2.5*  --  2.3* 2.2* 2.2* 2.2* 2.4* 2.5*      Liver Function Tests: Recent Labs  Lab 03/28/22 0420 03/29/22 0303 03/31/22 0412 04/01/22 0336 04/02/22 0230  AST 121*  --   --   --   --   ALT 76*  --   --   --  11  ALKPHOS 44  --   --   --  44  BILITOT 0.8  --   --   --  0.5  PROT 4.6*  --   --   --  5.1*  ALBUMIN 2.3*   < > 2.2* 2.4* 2.5*   < > = values in this interval not displayed.    No results for input(s): "LIPASE", "AMYLASE" in the last 168 hours. No results for input(s): "AMMONIA" in the last 168 hours. CBC: Recent Labs    03/27/22 1421 03/28/22 0420 03/30/22 0146 03/31/22 0412 04/01/22 0336 04/01/22 1610  HGB 9.9* 7.9* 7.2*  --  6.6* 8.5*  MCV  --  94.1 95.9  --  96.6 91.5  FERRITIN  --   --   --  510*  --   --   TIBC  --   --   --  251  --   --   IRON  --   --   --  53  --   --      Cardiac Enzymes: Recent Labs  Lab 03/28/22 0420 03/30/22 0146 04/01/22 1610  CKTOTAL 3,371* 1,772* 906*    CBG: Recent Labs  Lab 04/01/22 1149 04/01/22 1601 04/01/22 1945 04/02/22 0401 04/02/22 0802  GLUCAP 98 119* 121* 124* 105*     Iron  Studies:  Recent Labs    03/31/22 0412  IRON 53  TIBC 251  FERRITIN 510*    Studies/Results: IR Fluoro Guide CV Line Right  Result Date: 04/01/2022 INDICATION: 42 year old with acute renal failure in the setting of severe rhabdomyolysis. EXAM: FLUOROSCOPIC AND ULTRASOUND GUIDED PLACEMENT OF A TUNNELED DIALYSIS CATHETER Physician: Stephan Minister. Henn, MD MEDICATIONS: Vancomycin 1 g ANESTHESIA/SEDATION: Versed 1.5 mg IV; Fentanyl 75 mcg IV; Benadryl 50 mg Moderate Sedation Time:  23 minutes The patient was continuously monitored during the procedure by the interventional radiology nurse under my direct supervision. FLUOROSCOPY TIME:  Radiation Exposure Index (as provided by the fluoroscopic device): 1 mGy Kerma COMPLICATIONS: None immediate. PROCEDURE: The procedure was explained to the patient. The risks and benefits of the procedure were discussed and the patient's questions were addressed. Informed consent was obtained from the patient. The patient was placed supine on the interventional table. Ultrasound confirmed a patent right internal jugular vein. Ultrasound image obtained for documentation. The right neck and chest was prepped and draped in a sterile fashion. Maximal barrier sterile technique was utilized including caps, mask, sterile gowns, sterile gloves, sterile drape, hand hygiene and skin antiseptic. The right neck was anesthetized with 1% lidocaine. A small incision was made with #11 blade scalpel. A 21 gauge needle directed into the right internal jugular vein with ultrasound guidance. A micropuncture dilator set was placed. A 19 cm tip to cuff Palindrome catheter was selected. The skin below the right clavicle was anesthetized and a small incision was made with an #11 blade scalpel. A subcutaneous tunnel was formed to the vein dermatotomy site.  The catheter was brought through the tunnel. The vein dermatotomy site was dilated to accommodate a peel-away sheath. The catheter was placed through the  peel-away sheath and directed into the central venous structures. The tip of the catheter was placed at superior cavoatrial junction with fluoroscopy. Fluoroscopic images were obtained for documentation. Both lumens were found to aspirate and flush well. The proper amount of heparin was flushed in both lumens. The vein dermatotomy site was closed using a single layer of absorbable suture and Dermabond. Gel-Foam was placed in subcutaneous tract for hemostasis. The catheter was secured to the skin using Prolene suture. IMPRESSION: Successful placement of a right jugular tunneled dialysis catheter using ultrasound and fluoroscopic guidance. Electronically Signed   By: Markus Daft M.D.   On: 04/01/2022 15:16   IR US Guide Vasc Access Right  Result Date: 04/01/2022 INDICATION: 42 year old with acute renal failure in the setting of severe rhabdomyolysis. EXAM: FLUOROSCOPIC AND ULTRASOUND GUIDED PLACEMENT OF A TUNNELED DIALYSIS CATHETER Physician: Stephan Minister. Henn, MD MEDICATIONS: Vancomycin 1 g ANESTHESIA/SEDATION: Versed 1.5 mg IV; Fentanyl 75 mcg IV; Benadryl 50 mg Moderate Sedation Time:  23 minutes The patient was continuously monitored during the procedure by the interventional radiology nurse under my direct supervision. FLUOROSCOPY TIME:  Radiation Exposure Index (as provided by the fluoroscopic device): 1 mGy Kerma COMPLICATIONS: None immediate. PROCEDURE: The procedure was explained to the patient. The risks and benefits of the procedure were discussed and the patient's questions were addressed. Informed consent was obtained from the patient. The patient was placed supine on the interventional table. Ultrasound confirmed a patent right internal jugular vein. Ultrasound image obtained for documentation. The right neck and chest was prepped and draped in a sterile fashion. Maximal barrier sterile technique was utilized including caps, mask, sterile gowns, sterile gloves, sterile drape, hand hygiene and skin  antiseptic. The right neck was anesthetized with 1% lidocaine. A small incision was made with #11 blade scalpel. A 21 gauge needle directed into the right internal jugular vein with ultrasound guidance. A micropuncture dilator set was placed. A 19 cm tip to cuff Palindrome catheter was selected. The skin below the right clavicle was anesthetized and a small incision was made with an #11 blade scalpel. A subcutaneous tunnel was formed to the vein dermatotomy site. The catheter was brought through the tunnel. The vein dermatotomy site was dilated to accommodate a peel-away sheath. The catheter was placed through the peel-away sheath and directed into the central venous structures. The tip of the catheter was placed at superior cavoatrial junction with fluoroscopy. Fluoroscopic images were obtained for documentation. Both lumens were found to aspirate and flush well. The proper amount of heparin was flushed in both lumens. The vein dermatotomy site was closed using a single layer of absorbable suture and Dermabond. Gel-Foam was placed in subcutaneous tract for hemostasis. The catheter was secured to the skin using Prolene suture. IMPRESSION: Successful placement of a right jugular tunneled dialysis catheter using ultrasound and fluoroscopic guidance. Electronically Signed   By: Markus Daft M.D.   On: 04/01/2022 15:16    Medications: Infusions:  sodium chloride Stopped (03/25/22 0345)   sodium chloride 100 mL/hr at 03/30/22 0517   anticoagulant sodium citrate      Scheduled Medications:  amLODipine  5 mg Oral Daily   aspirin EC  81 mg Oral Daily   bisacodyl  10 mg Rectal Daily   Chlorhexidine Gluconate Cloth  6 each Topical q morning   Chlorhexidine Gluconate Cloth  6  each Topical K8127   folic acid  1 mg Oral Daily   heparin  5,000 Units Subcutaneous Q8H   hydrALAZINE  25 mg Oral Q8H   insulin aspart  0-9 Units Subcutaneous Q4H   lidocaine  1 patch Transdermal QHS   morphine  15 mg Oral Q12H    multivitamin with minerals  1 tablet Oral Daily   sodium chloride flush  10-40 mL Intracatheter Q12H   thiamine  100 mg Oral Daily    have reviewed scheduled and prn medications.  Physical Exam: General:NAD, comfortable Heart:RRR, s1s2 nl Lungs:clear b/l, no crackle Abdomen:soft, Non-tender, non-distended Extremities:No edema, left upper extremity dressing in place Dialysis Access: Right IJ temporary HD catheter in place  [name] [name] 04/02/2022,9:57 AM  LOS: 13 days

## 2022-04-02 NOTE — Progress Notes (Signed)
   04/02/22 0139  Vitals  Temp 98 F (36.7 C)  Temp Source Axillary  BP 139/80  BP Location Right Arm  BP Method Automatic  Patient Position (if appropriate) Lying  Pulse Rate 98  Pulse Rate Source Monitor  Resp 15  Oxygen Therapy  SpO2 100 %  O2 Device Room Air  During Treatment Monitoring  Intra-Hemodialysis Comments Tx completed  Post Treatment  Dialyzer Clearance Lightly streaked  Duration of HD Treatment -hour(s) 3.5 hour(s)  Liters Processed 84  Fluid Removed (mL) 2000 mL  Tolerated HD Treatment Yes  Post-Hemodialysis Comments tx complete, pt stable, no adverse effects , goal met  Hemodialysis Catheter Right Internal jugular Double lumen Permanent (Tunneled)  Placement Date/Time: 04/01/22 0952   Serial / Lot #: [id number]  Expiration Date: 09/30/26  Time Out: Correct patient;Correct site;Correct procedure  Maximum sterile barrier precautions: Hand hygiene;Cap;Mask;Sterile gown;Sterile gloves;Large sterile ...  Site Condition No complications  Blue Lumen Status Flushed;Heparin locked;Dead end cap in place  Red Lumen Status Flushed;Dead end cap in place;Heparin locked  Catheter fill solution Heparin 1000 units/ml  Catheter fill volume (Arterial) 1.6 cc  Catheter fill volume (Venous) 1.6  Dressing Type Gauze/Drain sponge  Dressing Status Clean, Dry, Intact  Interventions Dressing reinforced  Dressing Change Due 04/02/22 (24 hours post surg)  Post treatment catheter status Capped and Clamped    

## 2022-04-03 DIAGNOSIS — T796XXA Traumatic ischemia of muscle, initial encounter: Secondary | ICD-10-CM | POA: Diagnosis not present

## 2022-04-03 LAB — GLUCOSE, CAPILLARY
Glucose-Capillary: 107 mg/dL — ABNORMAL HIGH (ref 70–99)
Glucose-Capillary: 116 mg/dL — ABNORMAL HIGH (ref 70–99)
Glucose-Capillary: 118 mg/dL — ABNORMAL HIGH (ref 70–99)
Glucose-Capillary: 118 mg/dL — ABNORMAL HIGH (ref 70–99)
Glucose-Capillary: 119 mg/dL — ABNORMAL HIGH (ref 70–99)
Glucose-Capillary: 123 mg/dL — ABNORMAL HIGH (ref 70–99)

## 2022-04-03 LAB — RENAL FUNCTION PANEL
Albumin: 2.5 g/dL — ABNORMAL LOW (ref 3.5–5.0)
Anion gap: 11 (ref 5–15)
BUN: 42 mg/dL — ABNORMAL HIGH (ref 6–20)
CO2: 23 mmol/L (ref 22–32)
Calcium: 8 mg/dL — ABNORMAL LOW (ref 8.9–10.3)
Chloride: 95 mmol/L — ABNORMAL LOW (ref 98–111)
Creatinine, Ser: 5.81 mg/dL — ABNORMAL HIGH (ref 0.61–1.24)
GFR, Estimated: 12 mL/min — ABNORMAL LOW (ref >60)
Glucose, Bld: 127 mg/dL — ABNORMAL HIGH (ref 70–99)
Phosphorus: 5.9 mg/dL — ABNORMAL HIGH (ref 2.5–4.6)
Potassium: 4.2 mmol/L (ref 3.5–5.1)
Sodium: 129 mmol/L — ABNORMAL LOW (ref 135–145)

## 2022-04-03 LAB — MAGNESIUM: Magnesium: 1.7 mg/dL (ref 1.7–2.4)
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
## 2022-04-03 NOTE — Progress Notes (Signed)
Inpatient Rehab Admissions Coordinator:   Confirmed discharge plan and caregiver support with pt's sister.  We will open insurance with [employer] today and follow for determination.    Shann Medal, PT, DPT Admissions Coordinator [phone] 04/03/22  3:24 PM
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
## 2022-04-03 NOTE — Progress Notes (Signed)
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   CRRT from 12/22-12/25/2023.  No sign of renal recovery so far with high intradialytic creatinine level.   TDC placed by IR on 1/2 a Watching UOP and change in SCr, no HD today  #  Rhabdo             - L hand             - s/p faciotomies             - CK improved   # Acute CVA, acute metabolic encephalopathy             - CTH on admit with subacute b/l cerebellar infarct              - s/p TTE w/o vegetation   # Hyponatremia             -stable to improved.   # Anemia due to acute illness, received Aranesp on 1/1 and started IV iron.  Monitor hemoglobin.   #LLE pain and numbness: Management per primary team.  Subjective:  No new issues Good UOP SCr 4.4 to 5.8, K 4.2 Last HD 04/01/22  Objective Vital signs in last 24 hours: Vitals:   04/02/22 1617 04/02/22 2031 04/03/22 0557 04/03/22 0747  BP: (!) 149/77 (!) 145/79 (!) 146/85 (!) 141/85  Pulse: (!) 101 (!) 109 99 (!) 103  Resp: 18 20 19 18   Temp: 97.7 F (36.5 C) 98 F (36.7 C) 98.7 F (37.1 C) 97.6 F (36.4 C)  TempSrc: Oral Oral Oral Oral  SpO2: 97% 100% 100% 96%  Weight:       Weight change:   Intake/Output Summary (Last 24 hours) at 04/03/2022 1400 Last data filed at 04/03/2022 1209 Gross per 24 hour  Intake 960 ml  Output 1750 ml  Net -790 ml        Labs: RENAL PANEL Recent Labs    03/25/22 0539 03/26/22 0419 03/26/22 0421 03/27/22 0334 03/27/22 1421 03/28/22 0420 03/29/22 0303 03/30/22 0146 03/31/22 0412 04/01/22 0336 04/02/22 0230 04/03/22 0602 04/03/22 0603  NA 130*  --  128* 129* 130* 129* 128* 125* 125* 125* 129* 129*  --   K 3.8  --  3.4* 4.1 4.9 4.4 4.4 4.9 5.0 5.2* 3.8 4.2  --   CL 98  --  99 97* 95* 97* 98 96* 89* 91* 93* 95*  --   CO2 22  --  22 24  --  24 20* 17* 24 22 26 23   --   GLUCOSE 102*  --  113* 103* 97 113* 121* 118* 100* 104* 102* 127*  --   BUN  34*  --  56* 42* 48* 62* 73* 81* 55* 67* 34* 42*  --   CREATININE 4.50*  --  7.18* 6.10* 7.90* 8.47* 9.63* 10.58* 7.22* 8.18* 4.35* 5.81*  --   CALCIUM 7.6*  --  7.6* 8.0*  --  7.6* 7.4* 7.4* 7.5* 7.7* 7.6* 8.0*  --   MG 2.4 2.7*  --  2.3  --  2.3 2.2 2.2 2.0 2.1 1.6*  --  1.7  PHOS 2.9  --  3.3 2.8  --  4.3 5.2* 6.1* 5.6* 7.4* 4.1 5.9*  --   ALBUMIN 2.2* 2.2* 2.2* 2.5*  --  2.3* 2.2* 2.2* 2.2* 2.4* 2.5* 2.5*  --       Liver Function Tests: Recent Labs  Lab 03/28/22 0420 03/29/22 0303 04/01/22 0336 04/02/22 0230 04/03/22 0602  AST 121*  --   --   --   --   ALT 76*  --   --  11  --   ALKPHOS 44  --   --  44  --   BILITOT 0.8  --   --  0.5  --   PROT 4.6*  --   --  5.1*  --   ALBUMIN 2.3*   < > 2.4* 2.5* 2.5*   < > = values in this interval not displayed.    No results for input(s): "LIPASE", "AMYLASE" in the last 168 hours. No results for input(s): "AMMONIA" in the last 168 hours. CBC: Recent Labs    03/27/22 1421 03/28/22 0420 03/30/22 0146 03/31/22 0412 04/01/22 0336 04/01/22 1610  HGB 9.9* 7.9* 7.2*  --  6.6* 8.5*  MCV  --  94.1 95.9  --  96.6 91.5  FERRITIN  --   --   --  510*  --   --   TIBC  --   --   --  251  --   --   IRON  --   --   --  53  --   --      Cardiac Enzymes: Recent Labs  Lab 03/28/22 0420 03/30/22 0146 04/01/22 1610  CKTOTAL 3,371* 1,772* 906*    CBG: Recent Labs  Lab 04/02/22 2051 04/03/22 0013 04/03/22 0439 04/03/22 0746 04/03/22 1132  GLUCAP 121* 118* 107* 118* 123*     Iron Studies:  No results for input(s): "IRON", "TIBC", "TRANSFERRIN", "FERRITIN" in the last 72 hours.  Studies/Results: US SCROTUM W/DOPPLER  Result Date: 04/02/2022 CLINICAL DATA:  Pain and swelling in scrotum EXAM: SCROTAL ULTRASOUND DOPPLER ULTRASOUND OF THE TESTICLES TECHNIQUE: Complete ultrasound examination of the testicles, epididymis, and other scrotal structures was performed. Color and spectral Doppler ultrasound were also utilized to evaluate  blood flow to the testicles. COMPARISON:  None Available. FINDINGS: There is diffuse edema in skin and subcutaneous plane in the scrotal wall on both sides. There are no loculated fluid collections or significant increase in vascularity in the scrotal wall. Findings suggest possible anasarca or cellulitis. Right testicle Measurements: 4 x 2.5 x 2.4 cm. No mass or microlithiasis visualized. Left testicle Measurements: 3.5 x 3.3 x 3.3 cm. No mass or microlithiasis visualized. Right epididymis:  There is possible 4 mm cyst. Left epididymis: There is slightly inhomogeneous echogenicity without discrete focal lesions or increase in vascularity. Hydrocele: Small bilateral hydrocele, more so on the left side. Internal debris is seen in the left hydrocele. There are no loculated fluid collections. Varicocele:  None visualized. Pulsed Doppler interrogation of both testes demonstrates normal low resistance arterial and venous waveforms bilaterally. IMPRESSION: There is no evidence of testicular torsion. Small bilateral hydrocele, more so on the left side. There is 4 mm right epididymal cyst. There is diffuse edema in skin and subcutaneous plane in the scrotal wall on both sides. There are no loculated fluid collections or significant increase in vascularity in the scrotal wall. Findings suggest possible anasarca or cellulitis. Electronically Signed   By: Elmer Picker M.D.   On: 04/02/2022 13:28    Medications: Infusions:  sodium chloride Stopped (03/25/22 0345)   sodium chloride 100 mL/hr at 03/30/22 0517   anticoagulant sodium citrate      Scheduled Medications:  amLODipine  5 mg Oral Daily   aspirin EC  81 mg Oral Daily  bisacodyl  10 mg Rectal Daily   Chlorhexidine Gluconate Cloth  6 each Topical q morning   Chlorhexidine Gluconate Cloth  6 each Topical Y2233   folic acid  1 mg Oral Daily   heparin  5,000 Units Subcutaneous Q8H   hydrALAZINE  25 mg Oral Q8H   insulin aspart  0-9 Units Subcutaneous  Q4H   lidocaine  1 patch Transdermal QHS   morphine  15 mg Oral Q12H   multivitamin with minerals  1 tablet Oral Daily   pregabalin  75 mg Oral BID   sodium chloride flush  10-40 mL Intracatheter Q12H   thiamine  100 mg Oral Daily    have reviewed scheduled and prn medications.  Physical Exam: General:NAD, comfortable Heart:RRR, s1s2 nl Lungs:clear b/l, no crackle Abdomen:soft, Non-tender, non-distended Extremities:No edema, left upper extremity dressing in place Dialysis Access: Right IJ TDC in place  Marketta Valadez B Iysha Mishkin 04/03/2022,2:00 PM  LOS: 14 days
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
## 2022-04-03 NOTE — Plan of Care (Signed)

## 2022-04-04 DIAGNOSIS — N179 Acute kidney failure, unspecified: Secondary | ICD-10-CM | POA: Diagnosis not present

## 2022-04-04 DIAGNOSIS — T796XXA Traumatic ischemia of muscle, initial encounter: Secondary | ICD-10-CM | POA: Diagnosis not present

## 2022-04-04 LAB — RENAL FUNCTION PANEL
Albumin: 2.6 g/dL — ABNORMAL LOW (ref 3.5–5.0)
Anion gap: 12 (ref 5–15)
BUN: 45 mg/dL — ABNORMAL HIGH (ref 6–20)
CO2: 22 mmol/L (ref 22–32)
Calcium: 8.2 mg/dL — ABNORMAL LOW (ref 8.9–10.3)
Chloride: 97 mmol/L — ABNORMAL LOW (ref 98–111)
Creatinine, Ser: 5.71 mg/dL — ABNORMAL HIGH (ref 0.61–1.24)
GFR, Estimated: 12 mL/min — ABNORMAL LOW (ref >60)
Glucose, Bld: 108 mg/dL — ABNORMAL HIGH (ref 70–99)
Phosphorus: 6.9 mg/dL — ABNORMAL HIGH (ref 2.5–4.6)
Potassium: 5.1 mmol/L (ref 3.5–5.1)
Sodium: 131 mmol/L — ABNORMAL LOW (ref 135–145)

## 2022-04-04 LAB — GLUCOSE, CAPILLARY
Glucose-Capillary: 111 mg/dL — ABNORMAL HIGH (ref 70–99)
Glucose-Capillary: 115 mg/dL — ABNORMAL HIGH (ref 70–99)
Glucose-Capillary: 117 mg/dL — ABNORMAL HIGH (ref 70–99)
Glucose-Capillary: 118 mg/dL — ABNORMAL HIGH (ref 70–99)
Glucose-Capillary: 121 mg/dL — ABNORMAL HIGH (ref 70–99)
Glucose-Capillary: 132 mg/dL — ABNORMAL HIGH (ref 70–99)
Glucose-Capillary: 147 mg/dL — ABNORMAL HIGH (ref 70–99)

## 2022-04-04 LAB — MAGNESIUM: Magnesium: 1.9 mg/dL (ref 1.7–2.4)

## 2022-04-04 MED ORDER — HYDROMORPHONE HCL 1 MG/ML IJ SOLN
0.5000 mg | INTRAMUSCULAR | Status: DC | PRN
  Administered 2022-04-07 – 2022-04-08 (×4): 0.5 mg via INTRAVENOUS
  Filled 2022-04-04 (×4): qty 0.5

## 2022-04-04 MED ORDER — HYDROMORPHONE HCL 2 MG PO TABS
2.0000 mg | ORAL_TABLET | Freq: Four times a day (QID) | ORAL | Status: DC
  Administered 2022-04-04 – 2022-04-08 (×15): 2 mg via ORAL
  Filled 2022-04-04 (×15): qty 1

## 2022-04-04 NOTE — Progress Notes (Signed)
Inpatient Rehab Admissions Coordinator:   I received insurance auth from [place].  Discussed with Dr. Pietro Cassis and plan for OR with Dr. Marla Roe on Monday for myriad application.  We will f/u for potential admit Tuesday pending pt stability and bed availability.   Shann Medal, PT, DPT Admissions Coordinator [phone] 04/04/22  11:31 AM
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
## 2022-04-04 NOTE — H&P (View-Only) (Signed)
8 Days Post-Op  Subjective:  [name] [name] was sitting up in the exam chair at the time my evaluation.  He notes ongoing pain in the left forearm.  He notes some paresthesias in the hand but denies any decrease sensation or range of motion  Objective: Vital signs in last 24 hours: Temp:  [97.6 F (36.4 C)-98.4 F (36.9 C)] 97.8 F (36.6 C) (01/05 0831) Pulse Rate:  [98-110] 110 (01/05 0831) Resp:  [16-19] 19 (01/05 0831) BP: (96-155)/(70-85) 129/78 (01/05 0831) SpO2:  [99 %-100 %] 99 % (01/05 0831) Last BM Date : 04/01/22 (patient states)   Physical Exam:   Left upper extremity wrapped with gauze this was removed revealing 2 linear incisions along the dorsal and volar aspect of the left upper extremity.  The dorsal incision is closed and covered with Xeroform, the incision is clean dry and intact.  The volar aspect wound is also covered in Xeroform this was not removed.  Clean with no purulence or discharge, the wound edges were clean with no surrounding redness the wound measured approximately 25 x 10 cm   Radial pulses intact, sensation intact to hand and fingers full active range of motion of the fingers Assessment/Plan: s/p Procedure(s): LEFT FOREARM IRRIGATION AND DEBRIDEMENT of fasciotomy wounds including skin subcutaneous tissues CLOSURE of left dorsal fasciotomy wound and proximal and distal portion of the volar fasciotomy wound   Plan for OR management on Monday,the eighth for preparation of the volar wound with application of myriad and application of wound VAC.  I redressed the wound today, no signs of infection.  No distal neurovascular deficits noted.    Stevie Kern Miraj Truss, PA-C 04/04/2022
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
## 2022-04-04 NOTE — Progress Notes (Signed)
8 Days Post-Op  Subjective:  [name] [name] was sitting up in the exam chair at the time my evaluation.  He notes ongoing pain in the left forearm.  He notes some paresthesias in the hand but denies any decrease sensation or range of motion  Objective: Vital signs in last 24 hours: Temp:  [97.6 F (36.4 C)-98.4 F (36.9 C)] 97.8 F (36.6 C) (01/05 0831) Pulse Rate:  [98-110] 110 (01/05 0831) Resp:  [16-19] 19 (01/05 0831) BP: (96-155)/(70-85) 129/78 (01/05 0831) SpO2:  [99 %-100 %] 99 % (01/05 0831) Last BM Date : 04/01/22 (patient states)   Physical Exam:   Left upper extremity wrapped with gauze this was removed revealing 2 linear incisions along the dorsal and volar aspect of the left upper extremity.  The dorsal incision is closed and covered with Xeroform, the incision is clean dry and intact.  The volar aspect wound is also covered in Xeroform this was not removed.  Clean with no purulence or discharge, the wound edges were clean with no surrounding redness the wound measured approximately 25 x 10 cm   Radial pulses intact, sensation intact to hand and fingers full active range of motion of the fingers Assessment/Plan: s/p Procedure(s): LEFT FOREARM IRRIGATION AND DEBRIDEMENT of fasciotomy wounds including skin subcutaneous tissues CLOSURE of left dorsal fasciotomy wound and proximal and distal portion of the volar fasciotomy wound   Plan for OR management on Monday,the eighth for preparation of the volar wound with application of myriad and application of wound VAC.  I redressed the wound today, no signs of infection.  No distal neurovascular deficits noted.    [name] Kern Ricki Vanhandel, PA-C 04/04/2022
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
## 2022-04-04 NOTE — Progress Notes (Signed)
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   CRRT from 12/22-12/25/2023.   TDC placed by IR on 1/2  UOP is excellent and creatinine appears to have plateaued, hopefully this means he has recovered sufficient GFR to stop dialysis Will follow-up on labs and urine output again tomorrow  #  Rhabdo             - L hand             - s/p faciotomies             - CK improved   # Acute CVA, acute metabolic encephalopathy             - CTH on admit with subacute b/l cerebellar infarct              - s/p TTE w/o vegetation   # Hyponatremia             -stable to improved.   # Anemia due to acute illness, received Aranesp on 1/1 and started IV iron.  Monitor hemoglobin.  Hold on further ESA with GFR recovery  #LLE pain and numbness: Management per primary team.  Subjective:  No new issues Good UOP, 2.5 L SCr stable at 5.7, was 5.8 yesterday, K5.1 Last HD 04/01/22  Objective Vital signs in last 24 hours: Vitals:   04/03/22 1556 04/03/22 2139 04/04/22 0608 04/04/22 0831  BP: (!) 140/76 (!) 155/85 96/70 129/78  Pulse: 99 98 99 (!) 110  Resp: 18 16 16 19   Temp: 97.9 F (36.6 C) 98.4 F (36.9 C) 97.6 F (36.4 C) 97.8 F (36.6 C)  TempSrc: Oral Oral Oral Oral  SpO2: 100% 100% 99% 99%  Weight:       Weight change:   Intake/Output Summary (Last 24 hours) at 04/04/2022 1113 Last data filed at 04/04/2022 0849 Gross per 24 hour  Intake 17611.61 ml  Output 2900 ml  Net 14711.61 ml        Labs: RENAL PANEL Recent Labs    03/26/22 0419 03/26/22 0421 03/27/22 0334 03/27/22 1421 03/28/22 0420 03/29/22 0303 03/30/22 0146 03/31/22 0412 04/01/22 0336 04/02/22 0230 04/03/22 0602 04/03/22 0603 04/04/22 0451 04/04/22 0453  NA  --  128* 129* 130* 129* 128* 125* 125* 125* 129* 129*  --  131*  --   K  --  3.4* 4.1 4.9 4.4 4.4 4.9 5.0 5.2* 3.8 4.2  --  5.1  --   CL  --  99 97* 95* 97* 98 96* 89* 91*  93* 95*  --  97*  --   CO2  --  22 24  --  24 20* 17* 24 22 26 23   --  22  --   GLUCOSE  --  113* 103* 97 113* 121* 118* 100* 104* 102* 127*  --  108*  --   BUN  --  56* 42* 48* 62* 73* 81* 55* 67* 34* 42*  --  45*  --   CREATININE  --  7.18* 6.10* 7.90* 8.47* 9.63* 10.58* 7.22* 8.18* 4.35* 5.81*  --  5.71*  --   CALCIUM  --  7.6* 8.0*  --  7.6* 7.4* 7.4* 7.5* 7.7* 7.6* 8.0*  --  8.2*  --   MG 2.7*  --  2.3  --  2.3 2.2 2.2 2.0 2.1 1.6*  --  1.7  --  1.9  PHOS  --  3.3 2.8  --  4.3 5.2* 6.1* 5.6* 7.4* 4.1 5.9*  --  6.9*  --   ALBUMIN 2.2* 2.2* 2.5*  --  2.3* 2.2* 2.2* 2.2* 2.4* 2.5* 2.5*  --  2.6*  --       Liver Function Tests: Recent Labs  Lab 04/02/22 0230 04/03/22 0602 04/04/22 0451  ALT 11  --   --   ALKPHOS 44  --   --   BILITOT 0.5  --   --   PROT 5.1*  --   --   ALBUMIN 2.5* 2.5* 2.6*    No results for input(s): "LIPASE", "AMYLASE" in the last 168 hours. No results for input(s): "AMMONIA" in the last 168 hours. CBC: Recent Labs    03/27/22 1421 03/28/22 0420 03/30/22 0146 03/31/22 0412 04/01/22 0336 04/01/22 1610  HGB 9.9* 7.9* 7.2*  --  6.6* 8.5*  MCV  --  94.1 95.9  --  96.6 91.5  FERRITIN  --   --   --  510*  --   --   TIBC  --   --   --  251  --   --   IRON  --   --   --  53  --   --      Cardiac Enzymes: Recent Labs  Lab 03/30/22 0146 04/01/22 1610  CKTOTAL 1,772* 906*    CBG: Recent Labs  Lab 04/03/22 1611 04/03/22 2134 04/04/22 0044 04/04/22 0443 04/04/22 0834  GLUCAP 116* 119* 147* 132* 121*     Iron Studies:  No results for input(s): "IRON", "TIBC", "TRANSFERRIN", "FERRITIN" in the last 72 hours.  Studies/Results: US SCROTUM W/DOPPLER  Result Date: 04/02/2022 CLINICAL DATA:  Pain and swelling in scrotum EXAM: SCROTAL ULTRASOUND DOPPLER ULTRASOUND OF THE TESTICLES TECHNIQUE: Complete ultrasound examination of the testicles, epididymis, and other scrotal structures was performed. Color and spectral Doppler ultrasound were also  utilized to evaluate blood flow to the testicles. COMPARISON:  None Available. FINDINGS: There is diffuse edema in skin and subcutaneous plane in the scrotal wall on both sides. There are no loculated fluid collections or significant increase in vascularity in the scrotal wall. Findings suggest possible anasarca or cellulitis. Right testicle Measurements: 4 x 2.5 x 2.4 cm. No mass or microlithiasis visualized. Left testicle Measurements: 3.5 x 3.3 x 3.3 cm. No mass or microlithiasis visualized. Right epididymis:  There is possible 4 mm cyst. Left epididymis: There is slightly inhomogeneous echogenicity without discrete focal lesions or increase in vascularity. Hydrocele: Small bilateral hydrocele, more so on the left side. Internal debris is seen in the left hydrocele. There are no loculated fluid collections. Varicocele:  None visualized. Pulsed Doppler interrogation of both testes demonstrates normal low resistance arterial and venous waveforms bilaterally. IMPRESSION: There is no evidence of testicular torsion. Small bilateral hydrocele, more so on the left side. There is 4 mm right epididymal cyst. There is diffuse edema in skin and subcutaneous plane in the scrotal wall on both sides. There are no loculated fluid collections or significant increase in vascularity in the scrotal wall. Findings suggest possible anasarca or cellulitis. Electronically Signed   By: Elmer Picker M.D.   On: 04/02/2022 13:28    Medications: Infusions:  sodium chloride Stopped (03/25/22 0345)   sodium chloride 100 mL/hr at 04/04/22 0400   anticoagulant sodium citrate      Scheduled Medications:  amLODipine  5 mg Oral Daily   aspirin EC  81 mg Oral Daily   bisacodyl  10 mg Rectal Daily  Chlorhexidine Gluconate Cloth  6 each Topical q morning   Chlorhexidine Gluconate Cloth  6 each Topical Q5672   folic acid  1 mg Oral Daily   heparin  5,000 Units Subcutaneous Q8H   hydrALAZINE  25 mg Oral Q8H   HYDROmorphone  2  mg Oral Q6H   insulin aspart  0-9 Units Subcutaneous Q4H   lidocaine  1 patch Transdermal QHS   multivitamin with minerals  1 tablet Oral Daily   pregabalin  75 mg Oral BID   sodium chloride flush  10-40 mL Intracatheter Q12H   thiamine  100 mg Oral Daily    have reviewed scheduled and prn medications.  Physical Exam: General:NAD, comfortable Heart:RRR, s1s2 nl Lungs:clear b/l, no crackle Abdomen:soft, Non-tender, non-distended Extremities:No edema, left upper extremity dressing in place Dialysis Access: Right IJ TDC in place  [place] 04/04/2022,11:13 AM  LOS: 15 days

## 2022-04-05 DIAGNOSIS — N179 Acute kidney failure, unspecified: Secondary | ICD-10-CM | POA: Diagnosis not present

## 2022-04-05 LAB — BASIC METABOLIC PANEL
Anion gap: 14 (ref 5–15)
BUN: 46 mg/dL — ABNORMAL HIGH (ref 6–20)
CO2: 22 mmol/L (ref 22–32)
Calcium: 8.2 mg/dL — ABNORMAL LOW (ref 8.9–10.3)
Chloride: 99 mmol/L (ref 98–111)
Creatinine, Ser: 5.34 mg/dL — ABNORMAL HIGH (ref 0.61–1.24)
GFR, Estimated: 13 mL/min — ABNORMAL LOW (ref >60)
Glucose, Bld: 106 mg/dL — ABNORMAL HIGH (ref 70–99)
Potassium: 4.8 mmol/L (ref 3.5–5.1)
Sodium: 135 mmol/L (ref 135–145)

## 2022-04-05 LAB — CBC WITH DIFFERENTIAL/PLATELET
Abs Immature Granulocytes: 0.19 10*3/uL — ABNORMAL HIGH (ref 0.00–0.07)
Basophils Absolute: 0.1 10*3/uL (ref 0.0–0.1)
Basophils Relative: 2 %
Eosinophils Absolute: 0.3 10*3/uL (ref 0.0–0.5)
Eosinophils Relative: 5 %
HCT: 25.9 % — ABNORMAL LOW (ref 39.0–52.0)
Hemoglobin: 8.4 g/dL — ABNORMAL LOW (ref 13.0–17.0)
Immature Granulocytes: 3 %
Lymphocytes Relative: 16 %
Lymphs Abs: 1.1 10*3/uL (ref 0.7–4.0)
MCH: 32.1 pg (ref 26.0–34.0)
MCHC: 32.4 g/dL (ref 30.0–36.0)
MCV: 98.9 fL (ref 80.0–100.0)
Monocytes Absolute: 0.9 10*3/uL (ref 0.1–1.0)
Monocytes Relative: 12 %
Neutro Abs: 4.3 10*3/uL (ref 1.7–7.7)
Neutrophils Relative %: 62 %
Platelets: 395 10*3/uL (ref 150–400)
RBC: 2.62 MIL/uL — ABNORMAL LOW (ref 4.22–5.81)
RDW: 15.6 % — ABNORMAL HIGH (ref 11.5–15.5)
WBC: 6.9 10*3/uL (ref 4.0–10.5)
nRBC: 0 % (ref 0.0–0.2)

## 2022-04-05 LAB — GLUCOSE, CAPILLARY
Glucose-Capillary: 106 mg/dL — ABNORMAL HIGH (ref 70–99)
Glucose-Capillary: 107 mg/dL — ABNORMAL HIGH (ref 70–99)
Glucose-Capillary: 113 mg/dL — ABNORMAL HIGH (ref 70–99)
Glucose-Capillary: 123 mg/dL — ABNORMAL HIGH (ref 70–99)
Glucose-Capillary: 124 mg/dL — ABNORMAL HIGH (ref 70–99)
Glucose-Capillary: 130 mg/dL — ABNORMAL HIGH (ref 70–99)

## 2022-04-05 NOTE — Progress Notes (Signed)
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   CRRT from 12/22-12/25/2023.   TDC placed by IR on 1/2, no longer necessary and will ask for removal Will not require further dialysis Will follow-up on labs and urine output again tomorrow We will go ahead and make appointment to follow-up with [place] in clinic No need for IV fluids, stopped, tolerating p.o. quite well  #  Rhabdo             - L hand             - s/p faciotomies, plastic surgery following             - CK improved   # Acute CVA, acute metabolic encephalopathy             - CTH on admit with subacute b/l cerebellar infarct              - s/p TTE w/o vegetation   # Hyponatremia             -stable to improved.   # Anemia due to acute illness, received Aranesp on 1/1 and started IV iron.  Monitor hemoglobin.  Hold on further ESA with GFR recovery  #LLE pain and numbness: Management per primary team.  Subjective:  No new issues Creatinine further improved, K4.8 1.4 L urine output Remains on IV fluids Has right tunneled IJ HD catheter  Objective Vital signs in last 24 hours: Vitals:   04/04/22 1616 04/04/22 2048 04/05/22 0410 04/05/22 0820  BP: (!) 160/97 (!) 157/73 (!) 166/96 126/74  Pulse: (!) 109 90 99 (!) 109  Resp: 19 18 18 20   Temp: 98.4 F (36.9 C) 98.4 F (36.9 C) 98.5 F (36.9 C) 98.3 F (36.8 C)  TempSrc: Oral   Oral  SpO2: 100% 100% 98% 97%  Weight:       Weight change:   Intake/Output Summary (Last 24 hours) at 04/05/2022 0959 Last data filed at 04/05/2022 0920 Gross per 24 hour  Intake 3428.67 ml  Output 1300 ml  Net 2128.67 ml        Labs: RENAL PANEL Recent Labs    03/26/22 0419 03/26/22 0421 03/27/22 0334 03/27/22 1421 03/28/22 0420 03/29/22 0303 03/30/22 0146 03/31/22 0412 04/01/22 0336 04/02/22 0230 04/03/22 0602 04/03/22 0603 04/04/22 0451 04/04/22 0453 04/05/22 0651  NA  --  128* 129* 130*  129* 128* 125* 125* 125* 129* 129*  --  131*  --  135  K  --  3.4* 4.1 4.9 4.4 4.4 4.9 5.0 5.2* 3.8 4.2  --  5.1  --  4.8  CL  --  99 97* 95* 97* 98 96* 89* 91* 93* 95*  --  97*  --  99  CO2  --  22 24  --  24 20* 17* 24 22 26 23   --  22  --  22  GLUCOSE  --  113* 103* 97 113* 121* 118* 100* 104* 102* 127*  --  108*  --  106*  BUN  --  56* 42* 48* 62* 73* 81* 55* 67* 34* 42*  --  45*  --  46*  CREATININE  --  7.18* 6.10* 7.90* 8.47* 9.63* 10.58* 7.22* 8.18* 4.35* 5.81*  --  5.71*  --  5.34*  CALCIUM  --  7.6* 8.0*  --  7.6* 7.4* 7.4* 7.5* 7.7* 7.6* 8.0*  --  8.2*  --  8.2*  MG 2.7*  --  2.3  --  2.3 2.2 2.2 2.0 2.1 1.6*  --  1.7  --  1.9  --   PHOS  --  3.3 2.8  --  4.3 5.2* 6.1* 5.6* 7.4* 4.1 5.9*  --  6.9*  --   --   ALBUMIN 2.2* 2.2* 2.5*  --  2.3* 2.2* 2.2* 2.2* 2.4* 2.5* 2.5*  --  2.6*  --   --       Liver Function Tests: Recent Labs  Lab 04/02/22 0230 04/03/22 0602 04/04/22 0451  ALT 11  --   --   ALKPHOS 44  --   --   BILITOT 0.5  --   --   PROT 5.1*  --   --   ALBUMIN 2.5* 2.5* 2.6*    No results for input(s): "LIPASE", "AMYLASE" in the last 168 hours. No results for input(s): "AMMONIA" in the last 168 hours. CBC: Recent Labs    03/28/22 0420 03/30/22 0146 03/31/22 0412 04/01/22 0336 04/01/22 1610 04/05/22 0651  HGB 7.9* 7.2*  --  6.6* 8.5* 8.4*  MCV 94.1 95.9  --  96.6 91.5 98.9  FERRITIN  --   --  510*  --   --   --   TIBC  --   --  251  --   --   --   IRON  --   --  53  --   --   --      Cardiac Enzymes: Recent Labs  Lab 03/30/22 0146 04/01/22 1610  CKTOTAL 1,772* 906*    CBG: Recent Labs  Lab 04/04/22 1612 04/04/22 2027 04/04/22 2326 04/05/22 0344 04/05/22 0801  GLUCAP 115* 111* 118* 107* 123*     Iron Studies:  No results for input(s): "IRON", "TIBC", "TRANSFERRIN", "FERRITIN" in the last 72 hours.  Studies/Results: No results found.  Medications: Infusions:  sodium chloride Stopped (03/25/22 0345)   anticoagulant sodium citrate       Scheduled Medications:  amLODipine  5 mg Oral Daily   aspirin EC  81 mg Oral Daily   bisacodyl  10 mg Rectal Daily   Chlorhexidine Gluconate Cloth  6 each Topical q morning   Chlorhexidine Gluconate Cloth  6 each Topical Q2297   folic acid  1 mg Oral Daily   heparin  5,000 Units Subcutaneous Q8H   hydrALAZINE  25 mg Oral Q8H   HYDROmorphone  2 mg Oral Q6H   insulin aspart  0-9 Units Subcutaneous Q4H   lidocaine  1 patch Transdermal QHS   multivitamin with minerals  1 tablet Oral Daily   pregabalin  75 mg Oral BID   sodium chloride flush  10-40 mL Intracatheter Q12H   thiamine  100 mg Oral Daily    have reviewed scheduled and prn medications.  Physical Exam: General:NAD, comfortable Heart:RRR, s1s2 nl Lungs:clear b/l, no crackle Abdomen:soft, Non-tender, non-distended Extremities:No edema, left upper extremity dressing in place Dialysis Access: Right IJ TDC in place  Wells Gerdeman B Kaydi Kley 04/05/2022,9:59 AM  LOS: 16 days

## 2022-04-05 NOTE — Plan of Care (Signed)
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
## 2022-04-06 DIAGNOSIS — N179 Acute kidney failure, unspecified: Secondary | ICD-10-CM | POA: Diagnosis not present

## 2022-04-06 LAB — RENAL FUNCTION PANEL
Albumin: 2.6 g/dL — ABNORMAL LOW (ref 3.5–5.0)
Anion gap: 12 (ref 5–15)
BUN: 51 mg/dL — ABNORMAL HIGH (ref 6–20)
CO2: 22 mmol/L (ref 22–32)
Calcium: 8.3 mg/dL — ABNORMAL LOW (ref 8.9–10.3)
Chloride: 102 mmol/L (ref 98–111)
Creatinine, Ser: 5.63 mg/dL — ABNORMAL HIGH (ref 0.61–1.24)
GFR, Estimated: 12 mL/min — ABNORMAL LOW (ref >60)
Glucose, Bld: 99 mg/dL (ref 70–99)
Phosphorus: 6.8 mg/dL — ABNORMAL HIGH (ref 2.5–4.6)
Potassium: 5.7 mmol/L — ABNORMAL HIGH (ref 3.5–5.1)
Sodium: 136 mmol/L (ref 135–145)

## 2022-04-06 LAB — CBC
HCT: 24.8 % — ABNORMAL LOW (ref 39.0–52.0)
Hemoglobin: 7.7 g/dL — ABNORMAL LOW (ref 13.0–17.0)
MCH: 31.4 pg (ref 26.0–34.0)
MCHC: 31 g/dL (ref 30.0–36.0)
MCV: 101.2 fL — ABNORMAL HIGH (ref 80.0–100.0)
Platelets: 300 10*3/uL (ref 150–400)
RBC: 2.45 MIL/uL — ABNORMAL LOW (ref 4.22–5.81)
RDW: 15.3 % (ref 11.5–15.5)
WBC: 4.6 10*3/uL (ref 4.0–10.5)
nRBC: 0 % (ref 0.0–0.2)

## 2022-04-06 LAB — GLUCOSE, CAPILLARY
Glucose-Capillary: 93 mg/dL (ref 70–99)
Glucose-Capillary: 112 mg/dL — ABNORMAL HIGH (ref 70–99)
Glucose-Capillary: 105 mg/dL — ABNORMAL HIGH (ref 70–99)
Glucose-Capillary: 122 mg/dL — ABNORMAL HIGH (ref 70–99)
Glucose-Capillary: 163 mg/dL — ABNORMAL HIGH (ref 70–99)

## 2022-04-06 MED ORDER — SODIUM ZIRCONIUM CYCLOSILICATE 10 G PO PACK
10.0000 g | PACK | Freq: Two times a day (BID) | ORAL | Status: AC
  Administered 2022-04-06 (×2): 10 g via ORAL
  Filled 2022-04-06 (×2): qty 1

## 2022-04-06 MED ORDER — SODIUM CHLORIDE 0.9 % IV SOLN: INTRAVENOUS | Status: DC

## 2022-04-06 MED ORDER — IPRATROPIUM-ALBUTEROL 0.5-2.5 (3) MG/3ML IN SOLN
3.0000 mL | RESPIRATORY_TRACT | Status: AC
  Administered 2022-04-06: 3 mL via RESPIRATORY_TRACT
  Filled 2022-04-06: qty 3

## 2022-04-06 MED ORDER — IPRATROPIUM-ALBUTEROL 0.5-2.5 (3) MG/3ML IN SOLN: 3.0000 mL | Freq: Four times a day (QID) | RESPIRATORY_TRACT | Status: DC | PRN

## 2022-04-06 MED ORDER — HYDROMORPHONE HCL 1 MG/ML IJ SOLN
0.5000 mg | INTRAMUSCULAR | Status: AC
  Administered 2022-04-06: 0.5 mg via INTRAVENOUS
  Filled 2022-04-06: qty 0.5

## 2022-04-06 NOTE — Progress Notes (Signed)
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   CRRT from 12/22-12/25/2023.   TDC placed by IR on 1/2, though on 1/6 removal was requested I think we will leave it in today because of the slight worsening in labs and hyperkalemia Still hope that he will not require further dialysis Will follow-up on labs and urine output again tomorrow I have already started to make appointment to follow-up with [place] in clinic Resume IV fluids today, use normal saline, 50 mL an hour  # Hyperkalemia: Give dose of Lokelma today, trend, as above  #  Rhabdo             - L hand             - s/p faciotomies, plastic surgery following             - CK improved   # Acute CVA, acute metabolic encephalopathy             - CTH on admit with subacute b/l cerebellar infarct              - s/p TTE w/o vegetation   # Hyponatremia             -stable to improved.   # Anemia due to acute illness, received Aranesp on 1/1 and started IV iron.  Monitor hemoglobin.  Hold on further ESA with GFR recovery  #LLE pain and numbness: Management per primary team.  Subjective:  Tunneled HD catheter not removed yesterday Complains of significant diarrhea overnight Urine output remains excellent, 2.4 L Creatinine up slightly to 5.6 and K5.7 this morning  Objective Vital signs in last 24 hours: Vitals:   04/05/22 1647 04/05/22 1944 04/06/22 0420 04/06/22 0826  BP: (!) 161/92 (!) 147/88 126/86 (!) 144/82  Pulse: (!) 110 99 (!) 104 (!) 106  Resp: 18 18 20 19   Temp: 98.5 F (36.9 C) 98.6 F (37 C) (!) 100.8 F (38.2 C) 98 F (36.7 C)  TempSrc: Oral Oral Oral Oral  SpO2: 100% 100% 100% 96%  Weight:       Weight change:   Intake/Output Summary (Last 24 hours) at 04/06/2022 0943 Last data filed at 04/06/2022 0400 Gross per 24 hour  Intake --  Output 1625 ml  Net -1625 ml        Labs: RENAL PANEL Recent Labs    03/26/22 0419  03/26/22 0421 03/27/22 0334 03/27/22 1421 03/28/22 0420 03/29/22 0303 03/30/22 0146 03/31/22 0412 04/01/22 0336 04/02/22 0230 04/03/22 0602 04/03/22 0603 04/04/22 0451 04/04/22 0453 04/05/22 0651 04/06/22 0620  NA  --    < > 129*   < > 129* 128* 125* 125* 125* 129* 129*  --  131*  --  135 136  K  --    < > 4.1   < > 4.4 4.4 4.9 5.0 5.2* 3.8 4.2  --  5.1  --  4.8 5.7*  CL  --    < > 97*   < > 97* 98 96* 89* 91* 93* 95*  --  97*  --  99 102  CO2  --    < > 24  --  24 20* 17* 24 22 26 23   --  22  --  22 22  GLUCOSE  --    < > 103*   < > 113* 121* 118* 100* 104* 102* 127*  --  108*  --  106* 99  BUN  --    < > 42*   < > 62* 73* 81* 55* 67* 34* 42*  --  45*  --  46* 51*  CREATININE  --    < > 6.10*   < > 8.47* 9.63* 10.58* 7.22* 8.18* 4.35* 5.81*  --  5.71*  --  5.34* 5.63*  CALCIUM  --    < > 8.0*  --  7.6* 7.4* 7.4* 7.5* 7.7* 7.6* 8.0*  --  8.2*  --  8.2* 8.3*  MG 2.7*  --  2.3  --  2.3 2.2 2.2 2.0 2.1 1.6*  --  1.7  --  1.9  --   --   PHOS  --    < > 2.8  --  4.3 5.2* 6.1* 5.6* 7.4* 4.1 5.9*  --  6.9*  --   --  6.8*  ALBUMIN 2.2*   < > 2.5*  --  2.3* 2.2* 2.2* 2.2* 2.4* 2.5* 2.5*  --  2.6*  --   --  2.6*   < > = values in this interval not displayed.      Liver Function Tests: Recent Labs  Lab 04/02/22 0230 04/03/22 0602 04/04/22 0451 04/06/22 0620  ALT 11  --   --   --   ALKPHOS 44  --   --   --   BILITOT 0.5  --   --   --   PROT 5.1*  --   --   --   ALBUMIN 2.5* 2.5* 2.6* 2.6*    No results for input(s): "LIPASE", "AMYLASE" in the last 168 hours. No results for input(s): "AMMONIA" in the last 168 hours. CBC: Recent Labs    03/30/22 0146 03/31/22 0412 04/01/22 0336 04/01/22 1610 04/05/22 0651 04/06/22 0620  HGB 7.2*  --  6.6* 8.5* 8.4* 7.7*  MCV 95.9  --  96.6 91.5 98.9 101.2*  FERRITIN  --  510*  --   --   --   --   TIBC  --  251  --   --   --   --   IRON  --  53  --   --   --   --      Cardiac Enzymes: Recent Labs  Lab 04/01/22 1610  CKTOTAL 906*     CBG: Recent Labs  Lab 04/05/22 1618 04/05/22 2034 04/05/22 2340 04/06/22 0350 04/06/22 0824  GLUCAP 124* 106* 130* 93 112*     Iron Studies:  No results for input(s): "IRON", "TIBC", "TRANSFERRIN", "FERRITIN" in the last 72 hours.  Studies/Results: No results found.  Medications: Infusions:  sodium chloride Stopped (03/25/22 0345)   sodium chloride     anticoagulant sodium citrate      Scheduled Medications:  amLODipine  5 mg Oral Daily   aspirin EC  81 mg Oral Daily   bisacodyl  10 mg Rectal Daily   Chlorhexidine Gluconate Cloth  6 each Topical q morning   Chlorhexidine Gluconate Cloth  6 each Topical [id number]   folic acid  1 mg Oral Daily   heparin  5,000 Units Subcutaneous Q8H   hydrALAZINE  25 mg Oral Q8H   HYDROmorphone  2 mg Oral Q6H   insulin aspart  0-9 Units Subcutaneous Q4H   lidocaine  1 patch Transdermal QHS   multivitamin with minerals  1 tablet Oral Daily   pregabalin  75 mg Oral BID   sodium chloride flush  10-40 mL Intracatheter Q12H  sodium zirconium cyclosilicate  10 g Oral BID   thiamine  100 mg Oral Daily    have reviewed scheduled and prn medications.  Physical Exam: General:NAD, comfortable Heart:RRR, s1s2 nl Lungs:clear b/l, no crackle Abdomen:soft, Non-tender, non-distended Extremities:No edema, left upper extremity dressing in place Dialysis Access: Right IJ TDC in place  Jenel Gierke B Deen Deguia 04/06/2022,9:43 AM  LOS: 17 days
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
## 2022-04-06 NOTE — Plan of Care (Signed)
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
## 2022-04-07 ENCOUNTER — Encounter (HOSPITAL_COMMUNITY): Payer: Self-pay | Admitting: Student

## 2022-04-07 ENCOUNTER — Encounter (HOSPITAL_COMMUNITY)
Admission: EM | Disposition: A | Discharge status: Rehabilitation Facility | Payer: Self-pay | Source: Home / Self Care | Attending: Internal Medicine

## 2022-04-07 ENCOUNTER — Inpatient Hospital Stay (HOSPITAL_COMMUNITY): Payer: Medicaid Other | Admitting: Anesthesiology

## 2022-04-07 ENCOUNTER — Other Ambulatory Visit: Payer: Self-pay

## 2022-04-07 DIAGNOSIS — N179 Acute kidney failure, unspecified: Secondary | ICD-10-CM | POA: Diagnosis not present

## 2022-04-07 DIAGNOSIS — M79A11 Nontraumatic compartment syndrome of right upper extremity: Secondary | ICD-10-CM | POA: Diagnosis not present

## 2022-04-07 DIAGNOSIS — M1990 Unspecified osteoarthritis, unspecified site: Secondary | ICD-10-CM

## 2022-04-07 DIAGNOSIS — F17210 Nicotine dependence, cigarettes, uncomplicated: Secondary | ICD-10-CM

## 2022-04-07 DIAGNOSIS — M6282 Rhabdomyolysis: Secondary | ICD-10-CM | POA: Diagnosis not present

## 2022-04-07 DIAGNOSIS — S41102A Unspecified open wound of left upper arm, initial encounter: Secondary | ICD-10-CM

## 2022-04-07 DIAGNOSIS — T79A12A Traumatic compartment syndrome of left upper extremity, initial encounter: Secondary | ICD-10-CM

## 2022-04-07 HISTORY — PX: APPLICATION OF WOUND VAC: SHX5189

## 2022-04-07 LAB — RENAL FUNCTION PANEL
Albumin: 2.6 g/dL — ABNORMAL LOW (ref 3.5–5.0)
Albumin: 2.8 g/dL — ABNORMAL LOW (ref 3.5–5.0)
Anion gap: 8 (ref 5–15)
Anion gap: 13 (ref 5–15)
BUN: 50 mg/dL — ABNORMAL HIGH (ref 6–20)
BUN: 50 mg/dL — ABNORMAL HIGH (ref 6–20)
CO2: 24 mmol/L (ref 22–32)
CO2: 24 mmol/L (ref 22–32)
Calcium: 7.9 mg/dL — ABNORMAL LOW (ref 8.9–10.3)
Calcium: 8.1 mg/dL — ABNORMAL LOW (ref 8.9–10.3)
Chloride: 105 mmol/L (ref 98–111)
Chloride: 102 mmol/L (ref 98–111)
Creatinine, Ser: 4.65 mg/dL — ABNORMAL HIGH (ref 0.61–1.24)
Creatinine, Ser: 4.18 mg/dL — ABNORMAL HIGH (ref 0.61–1.24)
GFR, Estimated: 15 mL/min — ABNORMAL LOW (ref >60)
GFR, Estimated: 17 mL/min — ABNORMAL LOW (ref >60)
Glucose, Bld: 99 mg/dL (ref 70–99)
Glucose, Bld: 162 mg/dL — ABNORMAL HIGH (ref 70–99)
Phosphorus: 6.5 mg/dL — ABNORMAL HIGH (ref 2.5–4.6)
Phosphorus: 6.2 mg/dL — ABNORMAL HIGH (ref 2.5–4.6)
Potassium: 4.9 mmol/L (ref 3.5–5.1)
Potassium: 4.3 mmol/L (ref 3.5–5.1)
Sodium: 137 mmol/L (ref 135–145)
Sodium: 139 mmol/L (ref 135–145)

## 2022-04-07 LAB — CBC
HCT: 24.1 % — ABNORMAL LOW (ref 39.0–52.0)
HCT: 25.1 % — ABNORMAL LOW (ref 39.0–52.0)
Hemoglobin: 7.6 g/dL — ABNORMAL LOW (ref 13.0–17.0)
Hemoglobin: 7.8 g/dL — ABNORMAL LOW (ref 13.0–17.0)
MCH: 31.4 pg (ref 26.0–34.0)
MCH: 31.1 pg (ref 26.0–34.0)
MCHC: 31.5 g/dL (ref 30.0–36.0)
MCHC: 31.1 g/dL (ref 30.0–36.0)
MCV: 99.6 fL (ref 80.0–100.0)
MCV: 100 fL (ref 80.0–100.0)
Platelets: 212 10*3/uL (ref 150–400)
Platelets: 214 10*3/uL (ref 150–400)
RBC: 2.42 MIL/uL — ABNORMAL LOW (ref 4.22–5.81)
RBC: 2.51 MIL/uL — ABNORMAL LOW (ref 4.22–5.81)
RDW: 14.6 % (ref 11.5–15.5)
RDW: 14.4 % (ref 11.5–15.5)
WBC: 4.5 10*3/uL (ref 4.0–10.5)
WBC: 4.7 10*3/uL (ref 4.0–10.5)
nRBC: 0 % (ref 0.0–0.2)
nRBC: 0 % (ref 0.0–0.2)

## 2022-04-07 LAB — POCT I-STAT, CHEM 8
BUN: 52 mg/dL — ABNORMAL HIGH (ref 6–20)
Calcium, Ion: 1.07 mmol/L — ABNORMAL LOW (ref 1.15–1.40)
Chloride: 104 mmol/L (ref 98–111)
Creatinine, Ser: 4.8 mg/dL — ABNORMAL HIGH (ref 0.61–1.24)
Glucose, Bld: 89 mg/dL (ref 70–99)
HCT: 21 % — ABNORMAL LOW (ref 39.0–52.0)
Hemoglobin: 7.1 g/dL — ABNORMAL LOW (ref 13.0–17.0)
Potassium: 4.7 mmol/L (ref 3.5–5.1)
Sodium: 139 mmol/L (ref 135–145)
TCO2: 24 mmol/L (ref 22–32)

## 2022-04-07 LAB — TYPE AND SCREEN
ABO/RH(D): A POS
Antibody Screen: NEGATIVE

## 2022-04-07 LAB — GLUCOSE, CAPILLARY
Glucose-Capillary: 105 mg/dL — ABNORMAL HIGH (ref 70–99)
Glucose-Capillary: 171 mg/dL — ABNORMAL HIGH (ref 70–99)
Glucose-Capillary: 190 mg/dL — ABNORMAL HIGH (ref 70–99)
Glucose-Capillary: 199 mg/dL — ABNORMAL HIGH (ref 70–99)
Glucose-Capillary: 88 mg/dL (ref 70–99)
Glucose-Capillary: 94 mg/dL (ref 70–99)

## 2022-04-07 SURGERY — APPLICATION, SKIN SUBSTITUTE
Anesthesia: General | Site: Arm Upper | Laterality: Left

## 2022-04-07 MED ORDER — MIDAZOLAM HCL 2 MG/2ML IJ SOLN
INTRAMUSCULAR | Status: DC | PRN
  Administered 2022-04-07: 2 mg via INTRAVENOUS

## 2022-04-07 MED ORDER — FENTANYL CITRATE (PF) 250 MCG/5ML IJ SOLN
INTRAMUSCULAR | Status: DC | PRN
  Administered 2022-04-07: 100 ug via INTRAVENOUS

## 2022-04-07 MED ORDER — ALTEPLASE 2 MG IJ SOLR
2.0000 mg | Freq: Once | INTRAMUSCULAR | Status: DC | PRN
  Filled 2022-04-07: qty 2

## 2022-04-07 MED ORDER — CLINDAMYCIN PHOSPHATE 900 MG/50ML IV SOLN
INTRAVENOUS | Status: DC | PRN
  Administered 2022-04-07: 900 mg via INTRAVENOUS

## 2022-04-07 MED ORDER — ANTICOAGULANT SODIUM CITRATE 4% (200MG/5ML) IV SOLN: 5.0000 mL | Status: DC | PRN

## 2022-04-07 MED ORDER — ORAL CARE MOUTH RINSE: 15.0000 mL | Freq: Once | OROMUCOSAL | Status: DC

## 2022-04-07 MED ORDER — LIDOCAINE 2% (20 MG/ML) 5 ML SYRINGE
INTRAMUSCULAR | Status: AC
  Filled 2022-04-07: qty 5

## 2022-04-07 MED ORDER — ONDANSETRON HCL 4 MG/2ML IJ SOLN
INTRAMUSCULAR | Status: DC | PRN
  Administered 2022-04-07: 4 mg via INTRAVENOUS

## 2022-04-07 MED ORDER — LIDOCAINE-EPINEPHRINE 1 %-1:100000 IJ SOLN
INTRAMUSCULAR | Status: DC | PRN
  Administered 2022-04-07: 20 mL

## 2022-04-07 MED ORDER — HEMOSTATIC AGENTS (NO CHARGE) OPTIME
TOPICAL | Status: DC | PRN
  Administered 2022-04-07: 1

## 2022-04-07 MED ORDER — CLINDAMYCIN PHOSPHATE 900 MG/50ML IV SOLN: 900.0000 mg | INTRAVENOUS | Status: DC

## 2022-04-07 MED ORDER — MIDAZOLAM HCL 2 MG/2ML IJ SOLN
INTRAMUSCULAR | Status: AC
  Filled 2022-04-07: qty 2

## 2022-04-07 MED ORDER — PROPOFOL 10 MG/ML IV BOLUS
INTRAVENOUS | Status: DC | PRN
  Administered 2022-04-07: 150 mg via INTRAVENOUS

## 2022-04-07 MED ORDER — LIDOCAINE-EPINEPHRINE 1 %-1:100000 IJ SOLN
INTRAMUSCULAR | Status: AC
  Filled 2022-04-07: qty 1

## 2022-04-07 MED ORDER — LIDOCAINE 2% (20 MG/ML) 5 ML SYRINGE
INTRAMUSCULAR | Status: DC | PRN
  Administered 2022-04-07: 60 mg via INTRAVENOUS

## 2022-04-07 MED ORDER — GENTAMICIN IN SALINE 1.6-0.9 MG/ML-% IV SOLN
INTRAVENOUS | Status: AC
  Filled 2022-04-07: qty 50

## 2022-04-07 MED ORDER — ONDANSETRON HCL 4 MG/2ML IJ SOLN
INTRAMUSCULAR | Status: AC
  Filled 2022-04-07: qty 2

## 2022-04-07 MED ORDER — PROPOFOL 10 MG/ML IV BOLUS
INTRAVENOUS | Status: AC
  Filled 2022-04-07: qty 20

## 2022-04-07 MED ORDER — ROCURONIUM BROMIDE 10 MG/ML (PF) SYRINGE
PREFILLED_SYRINGE | INTRAVENOUS | Status: AC
  Filled 2022-04-07: qty 10

## 2022-04-07 MED ORDER — FENTANYL CITRATE (PF) 100 MCG/2ML IJ SOLN
INTRAMUSCULAR | Status: AC
  Administered 2022-04-07: 50 ug
  Filled 2022-04-07: qty 2

## 2022-04-07 MED ORDER — HEPARIN SODIUM (PORCINE) 1000 UNIT/ML DIALYSIS
40.0000 [IU]/kg | INTRAMUSCULAR | Status: DC | PRN
  Filled 2022-04-07: qty 4

## 2022-04-07 MED ORDER — HEPARIN SODIUM (PORCINE) 1000 UNIT/ML DIALYSIS
2000.0000 [IU] | Freq: Once | INTRAMUSCULAR | Status: DC
  Filled 2022-04-07: qty 2

## 2022-04-07 MED ORDER — LIDOCAINE HCL (PF) 1 % IJ SOLN
5.0000 mL | INTRAMUSCULAR | Status: DC | PRN
  Filled 2022-04-07: qty 5

## 2022-04-07 MED ORDER — SODIUM CHLORIDE 0.9 % IR SOLN
Status: DC | PRN
  Administered 2022-04-07: 1000 mL

## 2022-04-07 MED ORDER — DEXAMETHASONE SODIUM PHOSPHATE 10 MG/ML IJ SOLN
INTRAMUSCULAR | Status: DC | PRN
  Administered 2022-04-07: 10 mg via INTRAVENOUS

## 2022-04-07 MED ORDER — FENTANYL CITRATE PF 50 MCG/ML IJ SOSY
50.0000 ug | PREFILLED_SYRINGE | Freq: Once | INTRAMUSCULAR | Status: DC
  Filled 2022-04-07: qty 1

## 2022-04-07 MED ORDER — KETAMINE HCL 50 MG/5ML IJ SOSY
PREFILLED_SYRINGE | INTRAMUSCULAR | Status: AC
  Filled 2022-04-07: qty 5

## 2022-04-07 MED ORDER — GENTAMICIN SULFATE 40 MG/ML IJ SOLN
INTRAVENOUS | Status: DC | PRN
  Administered 2022-04-07: 390 mg via INTRAVENOUS

## 2022-04-07 MED ORDER — DEXAMETHASONE SODIUM PHOSPHATE 10 MG/ML IJ SOLN
INTRAMUSCULAR | Status: AC
  Filled 2022-04-07: qty 1

## 2022-04-07 MED ORDER — HEPARIN SODIUM (PORCINE) 1000 UNIT/ML DIALYSIS
20.0000 [IU]/kg | INTRAMUSCULAR | Status: DC | PRN
  Filled 2022-04-07: qty 2

## 2022-04-07 MED ORDER — GENTAMICIN SULFATE 40 MG/ML IJ SOLN
5.0000 mg/kg | INTRAVENOUS | Status: DC
  Filled 2022-04-07: qty 9.75

## 2022-04-07 MED ORDER — SODIUM CHLORIDE 0.9 % IV SOLN: INTRAVENOUS | Status: DC

## 2022-04-07 MED ORDER — CLINDAMYCIN PHOSPHATE 900 MG/50ML IV SOLN
INTRAVENOUS | Status: AC
  Filled 2022-04-07: qty 50

## 2022-04-07 MED ORDER — FENTANYL CITRATE (PF) 250 MCG/5ML IJ SOLN
INTRAMUSCULAR | Status: AC
  Filled 2022-04-07: qty 5

## 2022-04-07 MED ORDER — ORAL CARE MOUTH RINSE: 15.0000 mL | Freq: Once | OROMUCOSAL | Status: AC

## 2022-04-07 MED ORDER — HEPARIN SODIUM (PORCINE) 1000 UNIT/ML DIALYSIS: 1000.0000 [IU] | INTRAMUSCULAR | Status: DC | PRN

## 2022-04-07 MED ORDER — LIDOCAINE-PRILOCAINE 2.5-2.5 % EX CREA
1.0000 | TOPICAL_CREAM | CUTANEOUS | Status: DC | PRN
  Filled 2022-04-07: qty 5

## 2022-04-07 MED ORDER — PENTAFLUOROPROP-TETRAFLUOROETH EX AERO: 1.0000 | INHALATION_SPRAY | CUTANEOUS | Status: DC | PRN

## 2022-04-07 MED ORDER — CHLORHEXIDINE GLUCONATE 0.12 % MT SOLN
OROMUCOSAL | Status: AC
  Administered 2022-04-07: 15 mL via OROMUCOSAL
  Filled 2022-04-07: qty 15

## 2022-04-07 MED ORDER — CHLORHEXIDINE GLUCONATE 0.12 % MT SOLN: 15.0000 mL | Freq: Once | OROMUCOSAL | Status: DC

## 2022-04-07 MED ORDER — CHLORHEXIDINE GLUCONATE 0.12 % MT SOLN: 15.0000 mL | Freq: Once | OROMUCOSAL | Status: AC

## 2022-04-07 MED ORDER — CHLORHEXIDINE GLUCONATE CLOTH 2 % EX PADS: 6.0000 | MEDICATED_PAD | Freq: Once | CUTANEOUS | Status: DC

## 2022-04-07 SURGICAL SUPPLY — 46 items
APL SKNCLS STERI-STRIP NONHPOA (GAUZE/BANDAGES/DRESSINGS)
BAG COUNTER SPONGE SURGICOUNT (BAG) ×1 IMPLANT
BAG SPNG CNTER NS LX DISP (BAG) ×1
BENZOIN TINCTURE PRP APPL 2/3 (GAUZE/BANDAGES/DRESSINGS) IMPLANT
BLADE CLIPPER SURG (BLADE) IMPLANT
BNDG ELASTIC 4X5.8 VLCR STR LF (GAUZE/BANDAGES/DRESSINGS) IMPLANT
BNDG ELASTIC 6X5.8 VLCR STR LF (GAUZE/BANDAGES/DRESSINGS) IMPLANT
BNDG GAUZE DERMACEA FLUFF 4 (GAUZE/BANDAGES/DRESSINGS) IMPLANT
BNDG GZE DERMACEA 4 6PLY (GAUZE/BANDAGES/DRESSINGS) ×2
CANISTER SUCT 3000ML PPV (MISCELLANEOUS) ×1 IMPLANT
COVER SURGICAL LIGHT HANDLE (MISCELLANEOUS) ×1 IMPLANT
DRAPE HALF SHEET 40X57 (DRAPES) IMPLANT
DRAPE INCISE IOBAN 66X45 STRL (DRAPES) IMPLANT
DRAPE ORTHO SPLIT 77X108 STRL (DRAPES)
DRAPE SURG ORHT 6 SPLT 77X108 (DRAPES) IMPLANT
DRSG ADAPTIC 3X8 NADH LF (GAUZE/BANDAGES/DRESSINGS) IMPLANT
DRSG CUTIMED SORBACT 7X9 (GAUZE/BANDAGES/DRESSINGS) IMPLANT
DRSG OPSITE 6X11 MED (GAUZE/BANDAGES/DRESSINGS) IMPLANT
DRSG TELFA 3X8 NADH STRL (GAUZE/BANDAGES/DRESSINGS) IMPLANT
DRSG VAC ATS LRG SENSATRAC (GAUZE/BANDAGES/DRESSINGS) IMPLANT
ELECT REM PT RETURN 9FT ADLT (ELECTROSURGICAL) ×1
ELECTRODE REM PT RTRN 9FT ADLT (ELECTROSURGICAL) ×1 IMPLANT
GAUZE PAD ABD 8X10 STRL (GAUZE/BANDAGES/DRESSINGS) IMPLANT
GAUZE SPONGE 4X4 12PLY STRL (GAUZE/BANDAGES/DRESSINGS) IMPLANT
GAUZE XEROFORM 5X9 LF (GAUZE/BANDAGES/DRESSINGS) IMPLANT
GLOVE BIO SURGEON STRL SZ 6.5 (GLOVE) ×1 IMPLANT
GOWN STRL REUS W/ TWL LRG LVL3 (GOWN DISPOSABLE) ×2 IMPLANT
GOWN STRL REUS W/TWL LRG LVL3 (GOWN DISPOSABLE) ×2
GRAFT MYRIAD 20X20 (Graft) IMPLANT
HANDPIECE INTERPULSE COAX TIP (DISPOSABLE)
HEMOSTAT ARISTA ABSORB 3G PWDR (HEMOSTASIS) IMPLANT
KIT BASIN OR (CUSTOM PROCEDURE TRAY) ×1 IMPLANT
KIT TURNOVER KIT B (KITS) ×1 IMPLANT
NS IRRIG 1000ML POUR BTL (IV SOLUTION) ×1 IMPLANT
PACK GENERAL/GYN (CUSTOM PROCEDURE TRAY) ×1 IMPLANT
PAD ARMBOARD 7.5X6 YLW CONV (MISCELLANEOUS) ×2 IMPLANT
PADDING CAST COTTON 6X4 STRL (CAST SUPPLIES) IMPLANT
SET HNDPC FAN SPRY TIP SCT (DISPOSABLE) IMPLANT
STAPLER VISISTAT 35W (STAPLE) IMPLANT
SUT CHROMIC 4 0 PS 2 18 (SUTURE) IMPLANT
SUT VIC AB 5-0 P-3 18XBRD (SUTURE) IMPLANT
SUT VIC AB 5-0 P3 18 (SUTURE) ×3
TOWEL GREEN STERILE (TOWEL DISPOSABLE) ×1 IMPLANT
TOWEL GREEN STERILE FF (TOWEL DISPOSABLE) ×1 IMPLANT
UNDERPAD 30X36 HEAVY ABSORB (UNDERPADS AND DIAPERS) ×1 IMPLANT
WATER STERILE IRR 1000ML POUR (IV SOLUTION) IMPLANT

## 2022-04-07 NOTE — Progress Notes (Signed)
Patient in short stay. Hgb 71. Dr. Lissa Hoard was notified. No new order at this time. Will continue to monitor.

## 2022-04-07 NOTE — Op Note (Addendum)
DATE OF OPERATION: 04/07/2022  LOCATION: [facility] Inpatient  PREOPERATIVE DIAGNOSIS: left arm wound after compartment syndrome and fasciotomy  POSTOPERATIVE DIAGNOSIS: Same  PROCEDURE: preparation of left arm wound 10 x 20 cm for placement of Myriad sheet 20 x 20 cm and VAC  SURGEON: Dmarius Reeder [employer], DO  ASSISTANT: Roetta Sessions, PA  EBL: 5 cc  CONDITION: Stable  COMPLICATIONS: None  INDICATION: The patient, [name] [name], is a 42 y.o. male born on 03-28-1981, is here for treatment of a massive left arm wound.  He had an unclear injury to the arm.  The patient was seen over a week ago by ortho and underwent fasciotomies.  The dorsal fasciotomy was closed.  The palmer side is 10 x 20 cm and not able to be closed.   PROCEDURE DETAILS:  The patient was seen prior to surgery and marked.  The IV antibiotics were given. The patient was taken to the operating room and given a general anesthetic. A standard time out was performed and all information was confirmed by those in the room. SCDs were placed.   The left arm was prepped and draped.  The arm was irrigated with saline.  The curette was used to remove and debride the hypergranulation tissue of the 10 x 20 cm wound of the left arm. Arista was applied for hemostasis and then lidocaine with epinephrine topical.  All of the myriad sheet was applied and doubled.  The sheet and the sorbact was sutured in place with the 5-0 Vicryl.  The ky gel was applied and the VAC.  There was an excellent seal.  There was an excellent seal.  The arm was wrapped with kerlex and an ace wrap. The patient was allowed to wake up and taken to recovery room in stable condition at the end of the case. The family was notified at the end of the case.   The advanced practice practitioner (APP) assisted throughout the case.  The APP was essential in retraction and counter traction when needed to make the case progress smoothly.  This retraction and  assistance made it possible to see the tissue plans for the procedure.  The assistance was needed for blood control, tissue re-approximation and assisted with closure of the incision site. Aroa study [id number].
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
## 2022-04-07 NOTE — Progress Notes (Addendum)
Patient ID: [name] [name], male   DOB: 1980/10/06, 42 y.o.   MRN: [id number] [facility] Progress Note   Assessment/ Plan:   1. Acute kidney Injury: Nonoliguric at this time and secondary to rhabdomyolysis/pigment nephropathy.  Transitioned from CRRT (12/22 - 12/25) to intermittent hemodialysis and now has right IJ TDC in place.  Awaiting labs from this morning to determine need for dialysis especially given hyperkalemia seen on labs yesterday (that was treated with [facility]).  He is slightly hypervolemic on physical exam and does not have any clear uremic signs or symptoms.  Update on 04/07/21 at 1315: Labs reviewed and notable for improving creatinine/potassium. Will cancel dialysis orders.  2.  Hyperkalemia: Seen on labs yesterday morning and treated with Lokelma.  Labs pending from this a.m. 3.  Rhabdomyolysis: Status post fasciotomy of left hand with ongoing plastic surgery follow-up/management.  Clinical improvement noted. 4.  Anemia: Secondary to critical illness and surgical losses.  Continue to trend hemoglobin/hematocrit.  No indications for PRBC at this time. 5.  Acute CVA: Admission imaging showing subacute bilateral cerebellar infarcts with additional workup negative for cardiac embolic source. 6.  Left lower extremity pain/numbness: With hyperesthesia but without any evidence of local infection/process.  Subjective:   Complains that he is not being allowed to drink fluids and reports pain/swelling of left leg   Objective:   BP 131/72 (BP Location: Right Arm)   Pulse (!) 112   Temp 99.8 F (37.7 C) (Oral)   Resp 16   Ht 5\' 7"  (1.702 m)   Wt 94.7 kg   SpO2 98%   BMI 32.70 kg/m   Intake/Output Summary (Last 24 hours) at 04/07/2022 0703 Last data filed at 04/07/2022 0013 Gross per 24 hour  Intake 495.99 ml  Output 1900 ml  Net -1404.01 ml   Weight change:   Physical Exam: Gen: Appears uncomfortable resting in bed, awake/alert and oriented CVS:  Pulse regular tachycardia, S1 and S2 normal Resp: Clear to auscultation bilaterally, no rales/rhonchi.  Right IJ TDC Abd: Soft, flat, nontender, nondistended Ext: 1+ left lower extremity edema with significant tenderness but without erythema.  Trace-1+ right lower extremity edema  Imaging: No results found.  Labs: BMET Recent Labs  Lab 04/01/22 0336 04/02/22 0230 04/03/22 0602 04/04/22 0451 04/05/22 0651 04/06/22 0620 04/07/22 0604  NA 125* 129* 129* 131* 135 136 137  K 5.2* 3.8 4.2 5.1 4.8 5.7* 4.9  CL 91* 93* 95* 97* 99 102 105  CO2 22 26 23 22 22 22 24   GLUCOSE 104* 102* 127* 108* 106* 99 99  BUN 67* 34* 42* 45* 46* 51* 50*  CREATININE 8.18* 4.35* 5.81* 5.71* 5.34* 5.63* 4.65*  CALCIUM 7.7* 7.6* 8.0* 8.2* 8.2* 8.3* 7.9*  PHOS 7.4* 4.1 5.9* 6.9*  --  6.8* 6.5*   CBC Recent Labs  Lab 04/01/22 0336 04/01/22 1610 04/05/22 0651 04/06/22 0620 04/07/22 0604  WBC 9.7 9.6 6.9 4.6 4.5  NEUTROABS 7.0  --  4.3  --   --   HGB 6.6* 8.5* 8.4* 7.7* 7.6*  HCT 19.6* 23.7* 25.9* 24.8* 24.1*  MCV 96.6 91.5 98.9 101.2* 99.6  PLT 312 348 395 300 212    Medications:     amLODipine  5 mg Oral Daily   aspirin EC  81 mg Oral Daily   bisacodyl  10 mg Rectal Daily   Chlorhexidine Gluconate Cloth  6 each Topical q morning   Chlorhexidine Gluconate Cloth  6 each Topical G6269   folic  acid  1 mg Oral Daily   heparin  5,000 Units Subcutaneous Q8H   hydrALAZINE  25 mg Oral Q8H   HYDROmorphone  2 mg Oral Q6H   insulin aspart  0-9 Units Subcutaneous Q4H   lidocaine  1 patch Transdermal QHS   multivitamin with minerals  1 tablet Oral Daily   pregabalin  75 mg Oral BID   sodium chloride flush  10-40 mL Intracatheter Q12H   thiamine  100 mg Oral Daily    Elmarie Shiley, MD 04/07/2022, 7:03 AM

## 2022-04-07 NOTE — Progress Notes (Signed)
Inpatient Rehab Admissions Coordinator:   Plans for OR today with Dr. Marla Roe for preparation of volar wound with myriad application and application of wound vac.  Potential for CIR admit tomorrow pending stability and pain control.   Shann Medal, PT, DPT Admissions Coordinator [phone] 04/07/22  10:25 AM

## 2022-04-07 NOTE — Progress Notes (Signed)
Nurse arrived into room to answer light and pt reports hemodialysis cath dressing was off and in the bed with them. Cath site clean, dry, intact at this time. Clean gauze and tape placed over open area and order placed for IV team to replace dressing. Pt educated not to mess or pick at dressing because it is important they stay in place at all times.

## 2022-04-07 NOTE — Progress Notes (Signed)
PT Cancellation Note  Patient Details Name: Orren Pietsch MRN: [id number] DOB: 1980/12/28   Cancelled Treatment:    Reason Eval/Treat Not Completed: Patient at procedure or test/unavailable; Patient down for OR.  Will attempt later as able and pt available.    Reginia Naas 04/07/2022, 12:12 PM Magda Kiel, PT Acute Rehabilitation Services Office:[phone] 04/07/2022

## 2022-04-07 NOTE — Progress Notes (Signed)
MD Posey Pronto updated dialysis staff that this patient didn't need dialysis this afternoon. Will await new orders.

## 2022-04-07 NOTE — Anesthesia Procedure Notes (Signed)
Procedure Name: LMA Insertion Date/Time: 04/07/2022 3:42 PM  Performed by: Lance Coon, CRNAPre-anesthesia Checklist: Patient identified, Emergency Drugs available, Suction available and Patient being monitored Patient Re-evaluated:Patient Re-evaluated prior to induction Oxygen Delivery Method: Circle system utilized Preoxygenation: Pre-oxygenation with 100% oxygen Induction Type: IV induction LMA: LMA inserted LMA Size: 4.0 Number of attempts: 1 Placement Confirmation: breath sounds checked- equal and bilateral and positive ETCO2 Tube secured with: Tape Dental Injury: Teeth and Oropharynx as per pre-operative assessment
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
## 2022-04-07 NOTE — Interval H&P Note (Signed)
History and Physical Interval Note:  04/07/2022 1:47 PM  [name] [name]  has presented today for surgery, with the diagnosis of Traumatic compartment syndrome of left upper extremity.  The various methods of treatment have been discussed with the patient and family. After consideration of risks, benefits and other options for treatment, the patient has consented to  Procedure(s): Preparation of left volar wound with application of myriad and application of wound VAC (Left) APPLICATION OF WOUND VAC (Left) as a surgical intervention.  The patient's history has been reviewed, patient examined, no change in status, stable for surgery.  I have reviewed the patient's chart and labs.  Questions were answered to the patient's satisfaction.     [name]

## 2022-04-07 NOTE — Progress Notes (Signed)
This Probation officer observed the patient with and unsteady gait with  transfer, from chair to bed.  This Probation officer observed some left sided weakness, with the transfer.  This writer administered PRN Dilaudid 0.5 mg, at approximately 1015. At this time noted, no further actions necessary.

## 2022-04-07 NOTE — Transfer of Care (Signed)
Immediate Anesthesia Transfer of Care Note  Patient: [name] [name]  Procedure(s) Performed: Preparation of left volar wound with application of myriad (Left: Arm Upper) APPLICATION OF WOUND VAC (Left: Arm Upper)  Patient Location: PACU  Anesthesia Type:General  Level of Consciousness: drowsy and patient cooperative  Airway & Oxygen Therapy: Patient Spontanous Breathing  Post-op Assessment: Report given to RN and Post -op Vital signs reviewed and stable  Post vital signs: Reviewed and stable  Last Vitals:  Vitals Value Taken Time  BP 134/84 04/07/22 1630  Temp    Pulse 104 04/07/22 1630  Resp 9 04/07/22 1630  SpO2 94 % 04/07/22 1630  Vitals shown include unvalidated device data.  Last Pain:  Vitals:   04/07/22 1137  TempSrc: Oral  PainSc:       Patients Stated Pain Goal: 0 (86/48/47 2072)  Complications: No notable events documented.

## 2022-04-08 ENCOUNTER — Inpatient Hospital Stay (HOSPITAL_COMMUNITY)
Admission: RE | Admit: 2022-04-08 | Discharge: 2022-04-21 | DRG: 056 | Disposition: A | Discharge status: Home / Self Care | Payer: Medicaid Other | Source: Intra-hospital | Attending: Physical Medicine & Rehabilitation | Admitting: Physical Medicine & Rehabilitation

## 2022-04-08 DIAGNOSIS — T8189XA Other complications of procedures, not elsewhere classified, initial encounter: Secondary | ICD-10-CM | POA: Diagnosis not present

## 2022-04-08 DIAGNOSIS — I69398 Other sequelae of cerebral infarction: Secondary | ICD-10-CM | POA: Diagnosis present

## 2022-04-08 DIAGNOSIS — I639 Cerebral infarction, unspecified: Principal | ICD-10-CM | POA: Diagnosis present

## 2022-04-08 DIAGNOSIS — F1910 Other psychoactive substance abuse, uncomplicated: Secondary | ICD-10-CM | POA: Diagnosis present

## 2022-04-08 DIAGNOSIS — K5903 Drug induced constipation: Secondary | ICD-10-CM | POA: Diagnosis not present

## 2022-04-08 DIAGNOSIS — M1990 Unspecified osteoarthritis, unspecified site: Secondary | ICD-10-CM | POA: Diagnosis not present

## 2022-04-08 DIAGNOSIS — F329 Major depressive disorder, single episode, unspecified: Secondary | ICD-10-CM | POA: Diagnosis not present

## 2022-04-08 DIAGNOSIS — R203 Hyperesthesia: Secondary | ICD-10-CM | POA: Diagnosis present

## 2022-04-08 DIAGNOSIS — T796XXS Traumatic ischemia of muscle, sequela: Secondary | ICD-10-CM

## 2022-04-08 DIAGNOSIS — E8809 Other disorders of plasma-protein metabolism, not elsewhere classified: Secondary | ICD-10-CM | POA: Diagnosis present

## 2022-04-08 DIAGNOSIS — F419 Anxiety disorder, unspecified: Secondary | ICD-10-CM | POA: Diagnosis present

## 2022-04-08 DIAGNOSIS — M21371 Foot drop, right foot: Secondary | ICD-10-CM | POA: Diagnosis present

## 2022-04-08 DIAGNOSIS — N289 Disorder of kidney and ureter, unspecified: Secondary | ICD-10-CM | POA: Diagnosis not present

## 2022-04-08 DIAGNOSIS — M79602 Pain in left arm: Secondary | ICD-10-CM | POA: Diagnosis present

## 2022-04-08 DIAGNOSIS — R200 Anesthesia of skin: Secondary | ICD-10-CM | POA: Diagnosis present

## 2022-04-08 DIAGNOSIS — D6489 Other specified anemias: Secondary | ICD-10-CM | POA: Diagnosis present

## 2022-04-08 DIAGNOSIS — M79605 Pain in left leg: Secondary | ICD-10-CM | POA: Diagnosis present

## 2022-04-08 DIAGNOSIS — Z880 Allergy status to penicillin: Secondary | ICD-10-CM

## 2022-04-08 DIAGNOSIS — I129 Hypertensive chronic kidney disease with stage 1 through stage 4 chronic kidney disease, or unspecified chronic kidney disease: Secondary | ICD-10-CM | POA: Diagnosis present

## 2022-04-08 DIAGNOSIS — Z79899 Other long term (current) drug therapy: Secondary | ICD-10-CM | POA: Diagnosis not present

## 2022-04-08 DIAGNOSIS — T79A12D Traumatic compartment syndrome of left upper extremity, subsequent encounter: Secondary | ICD-10-CM

## 2022-04-08 DIAGNOSIS — F1210 Cannabis abuse, uncomplicated: Secondary | ICD-10-CM | POA: Diagnosis present

## 2022-04-08 DIAGNOSIS — K7200 Acute and subacute hepatic failure without coma: Secondary | ICD-10-CM | POA: Diagnosis present

## 2022-04-08 DIAGNOSIS — T79A12A Traumatic compartment syndrome of left upper extremity, initial encounter: Secondary | ICD-10-CM

## 2022-04-08 DIAGNOSIS — Z818 Family history of other mental and behavioral disorders: Secondary | ICD-10-CM

## 2022-04-08 DIAGNOSIS — M21372 Foot drop, left foot: Secondary | ICD-10-CM | POA: Diagnosis present

## 2022-04-08 DIAGNOSIS — Y92239 Unspecified place in hospital as the place of occurrence of the external cause: Secondary | ICD-10-CM | POA: Diagnosis not present

## 2022-04-08 DIAGNOSIS — E875 Hyperkalemia: Secondary | ICD-10-CM | POA: Diagnosis not present

## 2022-04-08 DIAGNOSIS — M609 Myositis, unspecified: Secondary | ICD-10-CM | POA: Diagnosis present

## 2022-04-08 DIAGNOSIS — F1410 Cocaine abuse, uncomplicated: Secondary | ICD-10-CM | POA: Diagnosis present

## 2022-04-08 DIAGNOSIS — G5702 Lesion of sciatic nerve, left lower limb: Secondary | ICD-10-CM | POA: Diagnosis present

## 2022-04-08 DIAGNOSIS — X58XXXD Exposure to other specified factors, subsequent encounter: Secondary | ICD-10-CM | POA: Diagnosis present

## 2022-04-08 DIAGNOSIS — M6282 Rhabdomyolysis: Secondary | ICD-10-CM | POA: Diagnosis present

## 2022-04-08 DIAGNOSIS — T79A12S Traumatic compartment syndrome of left upper extremity, sequela: Secondary | ICD-10-CM | POA: Diagnosis not present

## 2022-04-08 DIAGNOSIS — Z452 Encounter for adjustment and management of vascular access device: Secondary | ICD-10-CM | POA: Diagnosis not present

## 2022-04-08 DIAGNOSIS — N179 Acute kidney failure, unspecified: Secondary | ICD-10-CM | POA: Diagnosis not present

## 2022-04-08 DIAGNOSIS — I10 Essential (primary) hypertension: Secondary | ICD-10-CM | POA: Diagnosis not present

## 2022-04-08 DIAGNOSIS — S7402XS Injury of sciatic nerve at hip and thigh level, left leg, sequela: Secondary | ICD-10-CM | POA: Diagnosis not present

## 2022-04-08 DIAGNOSIS — R4182 Altered mental status, unspecified: Secondary | ICD-10-CM | POA: Diagnosis not present

## 2022-04-08 DIAGNOSIS — T796XXA Traumatic ischemia of muscle, initial encounter: Secondary | ICD-10-CM

## 2022-04-08 DIAGNOSIS — F17210 Nicotine dependence, cigarettes, uncomplicated: Secondary | ICD-10-CM | POA: Diagnosis present

## 2022-04-08 DIAGNOSIS — F411 Generalized anxiety disorder: Secondary | ICD-10-CM | POA: Diagnosis not present

## 2022-04-08 DIAGNOSIS — Z8249 Family history of ischemic heart disease and other diseases of the circulatory system: Secondary | ICD-10-CM | POA: Diagnosis not present

## 2022-04-08 DIAGNOSIS — S41102A Unspecified open wound of left upper arm, initial encounter: Secondary | ICD-10-CM | POA: Diagnosis not present

## 2022-04-08 DIAGNOSIS — D62 Acute posthemorrhagic anemia: Secondary | ICD-10-CM | POA: Diagnosis present

## 2022-04-08 DIAGNOSIS — F321 Major depressive disorder, single episode, moderate: Secondary | ICD-10-CM | POA: Diagnosis not present

## 2022-04-08 DIAGNOSIS — T402X5A Adverse effect of other opioids, initial encounter: Secondary | ICD-10-CM | POA: Diagnosis not present

## 2022-04-08 DIAGNOSIS — Z7982 Long term (current) use of aspirin: Secondary | ICD-10-CM

## 2022-04-08 LAB — GLUCOSE, CAPILLARY
Glucose-Capillary: 134 mg/dL — ABNORMAL HIGH (ref 70–99)
Glucose-Capillary: 130 mg/dL — ABNORMAL HIGH (ref 70–99)
Glucose-Capillary: 144 mg/dL — ABNORMAL HIGH (ref 70–99)
Glucose-Capillary: 99 mg/dL (ref 70–99)

## 2022-04-08 MED ORDER — BISACODYL 10 MG RE SUPP: 10.0000 mg | Freq: Every day | RECTAL | 0 refills | Status: DC

## 2022-04-08 MED ORDER — DOCUSATE SODIUM 100 MG PO CAPS: 100.0000 mg | ORAL_CAPSULE | Freq: Two times a day (BID) | ORAL | 0 refills | Status: DC | PRN

## 2022-04-08 MED ORDER — FOLIC ACID 1 MG PO TABS: 1.0000 mg | ORAL_TABLET | Freq: Every day | ORAL | Status: DC

## 2022-04-08 MED ORDER — PREGABALIN 75 MG PO CAPS
75.0000 mg | ORAL_CAPSULE | Freq: Three times a day (TID) | ORAL | Status: DC
  Administered 2022-04-08 – 2022-04-11 (×8): 75 mg via ORAL
  Filled 2022-04-08 (×8): qty 1

## 2022-04-08 MED ORDER — PREGABALIN 75 MG PO CAPS: 75.0000 mg | ORAL_CAPSULE | Freq: Two times a day (BID) | ORAL | Status: DC

## 2022-04-08 MED ORDER — TRAZODONE HCL 100 MG PO TABS: 100.0000 mg | ORAL_TABLET | Freq: Every evening | ORAL | Status: DC | PRN

## 2022-04-08 MED ORDER — ASPIRIN 81 MG PO TBEC: 81.0000 mg | DELAYED_RELEASE_TABLET | Freq: Every day | ORAL | 12 refills | Status: DC

## 2022-04-08 MED ORDER — AMLODIPINE BESYLATE 5 MG PO TABS: 5.0000 mg | ORAL_TABLET | Freq: Every day | ORAL | Status: DC

## 2022-04-08 MED ORDER — ALTEPLASE 2 MG IJ SOLR
2.0000 mg | Freq: Once | INTRAMUSCULAR | Status: DC | PRN
  Filled 2022-04-08: qty 2

## 2022-04-08 MED ORDER — LIDOCAINE 5 % EX PTCH
1.0000 | MEDICATED_PATCH | Freq: Every day | CUTANEOUS | Status: DC
  Administered 2022-04-08 – 2022-04-20 (×13): 1 via TRANSDERMAL
  Filled 2022-04-08 (×13): qty 1

## 2022-04-08 MED ORDER — SODIUM CHLORIDE 0.9% FLUSH: 10.0000 mL | INTRAVENOUS | Status: DC | PRN

## 2022-04-08 MED ORDER — SODIUM CHLORIDE 0.9% FLUSH
10.0000 mL | Freq: Two times a day (BID) | INTRAVENOUS | Status: DC
  Administered 2022-04-08 – 2022-04-15 (×6): 10 mL

## 2022-04-08 MED ORDER — ADULT MULTIVITAMIN W/MINERALS CH
1.0000 | ORAL_TABLET | Freq: Every day | ORAL | Status: DC
  Administered 2022-04-09 – 2022-04-21 (×13): 1 via ORAL
  Filled 2022-04-08 (×14): qty 1

## 2022-04-08 MED ORDER — HEPARIN SODIUM (PORCINE) 5000 UNIT/ML IJ SOLN
5000.0000 [IU] | Freq: Three times a day (TID) | INTRAMUSCULAR | Status: DC
  Administered 2022-04-08 – 2022-04-20 (×36): 5000 [IU] via SUBCUTANEOUS
  Filled 2022-04-08 (×38): qty 1

## 2022-04-08 MED ORDER — FOLIC ACID 1 MG PO TABS
1.0000 mg | ORAL_TABLET | Freq: Every day | ORAL | Status: DC
  Administered 2022-04-09 – 2022-04-21 (×13): 1 mg via ORAL
  Filled 2022-04-08 (×13): qty 1

## 2022-04-08 MED ORDER — PROCHLORPERAZINE 25 MG RE SUPP: 12.5000 mg | Freq: Four times a day (QID) | RECTAL | Status: DC | PRN

## 2022-04-08 MED ORDER — GUAIFENESIN-DM 100-10 MG/5ML PO SYRP: 5.0000 mL | ORAL_SOLUTION | Freq: Four times a day (QID) | ORAL | Status: DC | PRN

## 2022-04-08 MED ORDER — METHOCARBAMOL 500 MG PO TABS
500.0000 mg | ORAL_TABLET | Freq: Four times a day (QID) | ORAL | Status: DC | PRN
  Administered 2022-04-08 – 2022-04-21 (×30): 500 mg via ORAL
  Filled 2022-04-08 (×33): qty 1

## 2022-04-08 MED ORDER — HYDROCODONE-ACETAMINOPHEN 5-325 MG PO TABS
1.0000 | ORAL_TABLET | ORAL | Status: DC | PRN
  Administered 2022-04-08 – 2022-04-10 (×8): 2 via ORAL
  Filled 2022-04-08 (×9): qty 2

## 2022-04-08 MED ORDER — POLYETHYLENE GLYCOL 3350 17 G PO PACK
17.0000 g | PACK | Freq: Every day | ORAL | Status: DC | PRN
  Administered 2022-04-12: 17 g via ORAL
  Filled 2022-04-08 (×2): qty 1

## 2022-04-08 MED ORDER — HYDROMORPHONE HCL 4 MG PO TABS: 4.0000 mg | ORAL_TABLET | Freq: Four times a day (QID) | ORAL | 0 refills | Status: DC

## 2022-04-08 MED ORDER — CYCLOBENZAPRINE HCL 5 MG PO TABS
10.0000 mg | ORAL_TABLET | Freq: Three times a day (TID) | ORAL | Status: DC | PRN
  Administered 2022-04-09 – 2022-04-21 (×31): 10 mg via ORAL
  Filled 2022-04-08 (×32): qty 2

## 2022-04-08 MED ORDER — ACETAMINOPHEN 325 MG PO TABS
325.0000 mg | ORAL_TABLET | ORAL | Status: DC | PRN
  Administered 2022-04-09 – 2022-04-21 (×9): 650 mg via ORAL
  Filled 2022-04-08 (×10): qty 2

## 2022-04-08 MED ORDER — PROCHLORPERAZINE MALEATE 5 MG PO TABS: 5.0000 mg | ORAL_TABLET | Freq: Four times a day (QID) | ORAL | Status: DC | PRN

## 2022-04-08 MED ORDER — HYDROMORPHONE HCL 2 MG PO TABS
4.0000 mg | ORAL_TABLET | Freq: Four times a day (QID) | ORAL | Status: DC
  Administered 2022-04-08 (×2): 4 mg via ORAL
  Filled 2022-04-08 (×2): qty 2

## 2022-04-08 MED ORDER — PROCHLORPERAZINE EDISYLATE 10 MG/2ML IJ SOLN: 5.0000 mg | Freq: Four times a day (QID) | INTRAMUSCULAR | Status: DC | PRN

## 2022-04-08 MED ORDER — NORTRIPTYLINE HCL 10 MG PO CAPS
10.0000 mg | ORAL_CAPSULE | Freq: Every day | ORAL | Status: DC
  Administered 2022-04-08 – 2022-04-09 (×2): 10 mg via ORAL
  Filled 2022-04-08 (×2): qty 1

## 2022-04-08 MED ORDER — LIDOCAINE 5 % EX PTCH: 1.0000 | MEDICATED_PATCH | Freq: Every day | CUTANEOUS | 0 refills | Status: DC

## 2022-04-08 MED ORDER — HYDRALAZINE HCL 25 MG PO TABS
25.0000 mg | ORAL_TABLET | Freq: Three times a day (TID) | ORAL | Status: DC
  Administered 2022-04-08 – 2022-04-21 (×38): 25 mg via ORAL
  Filled 2022-04-08 (×39): qty 1

## 2022-04-08 MED ORDER — TRAZODONE HCL 50 MG PO TABS: 100.0000 mg | ORAL_TABLET | Freq: Every evening | ORAL | Status: DC | PRN

## 2022-04-08 MED ORDER — HYDROMORPHONE HCL 2 MG PO TABS
4.0000 mg | ORAL_TABLET | Freq: Four times a day (QID) | ORAL | Status: DC
  Administered 2022-04-08 – 2022-04-09 (×2): 4 mg via ORAL
  Filled 2022-04-08 (×2): qty 2

## 2022-04-08 MED ORDER — TRAZODONE HCL 50 MG PO TABS
25.0000 mg | ORAL_TABLET | Freq: Every evening | ORAL | Status: DC | PRN
  Administered 2022-04-08 – 2022-04-17 (×9): 50 mg via ORAL
  Filled 2022-04-08 (×9): qty 1

## 2022-04-08 MED ORDER — HYDROCODONE-ACETAMINOPHEN 5-325 MG PO TABS: 1.0000 | ORAL_TABLET | ORAL | 0 refills | Status: DC | PRN

## 2022-04-08 MED ORDER — ASPIRIN 81 MG PO TBEC
81.0000 mg | DELAYED_RELEASE_TABLET | Freq: Every day | ORAL | Status: DC
  Administered 2022-04-09 – 2022-04-21 (×13): 81 mg via ORAL
  Filled 2022-04-08 (×13): qty 1

## 2022-04-08 MED ORDER — VITAMIN B-1 100 MG PO TABS: 100.0000 mg | ORAL_TABLET | Freq: Every day | ORAL | Status: DC

## 2022-04-08 MED ORDER — HYDRALAZINE HCL 25 MG PO TABS: 25.0000 mg | ORAL_TABLET | Freq: Three times a day (TID) | ORAL | Status: DC

## 2022-04-08 MED ORDER — SORBITOL 70 % SOLN
30.0000 mL | Freq: Every day | Status: DC | PRN
  Administered 2022-04-10 – 2022-04-15 (×5): 30 mL via ORAL
  Filled 2022-04-08 (×7): qty 30

## 2022-04-08 MED ORDER — SODIUM CHLORIDE 0.9 % IV SOLN: INTRAVENOUS | Status: DC

## 2022-04-08 MED ORDER — AMLODIPINE BESYLATE 5 MG PO TABS
5.0000 mg | ORAL_TABLET | Freq: Every day | ORAL | Status: DC
  Administered 2022-04-09 – 2022-04-21 (×13): 5 mg via ORAL
  Filled 2022-04-08 (×13): qty 1

## 2022-04-08 MED ORDER — CHLORHEXIDINE GLUCONATE CLOTH 2 % EX PADS: 6.0000 | MEDICATED_PAD | Freq: Every morning | CUTANEOUS | Status: DC

## 2022-04-08 MED ORDER — DIPHENHYDRAMINE HCL 12.5 MG/5ML PO ELIX: 12.5000 mg | ORAL_SOLUTION | Freq: Four times a day (QID) | ORAL | Status: DC | PRN

## 2022-04-08 MED ORDER — POLYETHYLENE GLYCOL 3350 17 G PO PACK: 17.0000 g | PACK | Freq: Every day | ORAL | 0 refills | Status: DC | PRN

## 2022-04-08 MED ORDER — THIAMINE MONONITRATE 100 MG PO TABS
100.0000 mg | ORAL_TABLET | Freq: Every day | ORAL | Status: DC
  Administered 2022-04-09 – 2022-04-21 (×12): 100 mg via ORAL
  Filled 2022-04-08 (×13): qty 1

## 2022-04-08 MED ORDER — FLEET ENEMA 7-19 GM/118ML RE ENEM: 1.0000 | ENEMA | Freq: Once | RECTAL | Status: DC | PRN

## 2022-04-08 MED ORDER — HEPARIN SODIUM (PORCINE) 5000 UNIT/ML IJ SOLN: 5000.0000 [IU] | Freq: Three times a day (TID) | INTRAMUSCULAR | Status: DC

## 2022-04-08 NOTE — Progress Notes (Signed)
1 Day Post-Op  Subjective:  Postop day #1 status post preparation of left volar wound with application of myriad and application of wound VAC. Patient notes he is doing well today, he notes that the pain in the left upper extremity has improved, he notes the swelling is better as well.  Objective: Vital signs in last 24 hours: Temp:  [97.5 F (36.4 C)-98 F (36.7 C)] 98 F (36.7 C) (01/09 0721) Pulse Rate:  [95-109] 102 (01/09 0721) Resp:  [11-20] 17 (01/09 0721) BP: (116-155)/(79-105) 155/94 (01/09 0721) SpO2:  [93 %-99 %] 96 % (01/09 0721) Weight:  [98.9 kg] 98.9 kg (01/09 0500) Last BM Date : 04/07/22   Physical Exam:  General: Pt resting comfortably in no acute distress Wound VAC and dressing in place this was left in place.  Full active range of motion and sensation in the fingers, cap refill intact   Assessment/Plan: s/p Procedure(s): Preparation of left volar wound with application of myriad APPLICATION OF WOUND VAC   [name] is doing well today with improving pain in the left upper extremity.  Given the edematous nature of the left upper extremity with a wound VAC will significantly help this as well as help close the incision.  We do plan for operative intervention next week to replace the wound VAC and attempt any possible closure.  I have recommended the patient continue to keep the extremity elevated.  We will continue to follow.   Stevie Kern Visente Kirker, PA-C 04/08/2022

## 2022-04-08 NOTE — Progress Notes (Signed)
Inpatient Rehab Admissions Coordinator:    I have insurance approval and a bed available for pt to admit to CIR today. Dr. [place] in agreement.  Will let pt/family and TOC team know.   Shann Medal, PT, DPT Admissions Coordinator [phone] 04/08/22  10:12 AM

## 2022-04-08 NOTE — Progress Notes (Signed)
CSW received notification from [place] at [facility] who states patient can be accepted into CIR today.  Madilyn Fireman, MSW, LCSW Transitions of Care  Clinical Social Worker II [phone]

## 2022-04-08 NOTE — Care Management Important Message (Signed)
Important Message  Patient Details  Name: [name] [name] MRN: [id number] Date of Birth: 1980-12-25   Medicare Important Message Given:  Yes    Teng Decou 04/08/2022, 2:05 PM

## 2022-04-08 NOTE — Progress Notes (Signed)
Alert and oriented x 4. Continues to report high pain. Several prn medication given but not much relief per pt. Dressing to RUA intact with wound vac in place. X 1 assist with positioning and adls. Call bell within reach. Continue plan of care. BP (!) 155/94 (BP Location: Right Arm)   Pulse (!) 102   Temp 98 F (36.7 C) (Oral)   Resp 17   Ht 5\' 7"  (1.702 m)   Wt 98.9 kg   SpO2 96%   BMI 34.15 kg/m

## 2022-04-08 NOTE — Progress Notes (Signed)
Inpatient Rehabilitation Admission Medication Review by a Pharmacist  A complete drug regimen review was completed for this patient to identify any potential clinically significant medication issues.  High Risk Drug Classes Is patient taking? Indication by Medication  Antipsychotic No   Anticoagulant Yes SQH - VTE ppx  Antibiotic No   Opioid Yes, as an intravenous medication Norco, Dilaudid (IV + PO)  Antiplatelet Yes Aspirin - CVA  Hypoglycemics/insulin Yes SSI - glucose control  Vasoactive Medication Yes Amlodipine, hydralazine - HTN  Chemotherapy No   Other Yes Lyrica, lidocaine patch - pain Trazodone - sleep     Type of Medication Issue Identified Description of Issue Recommendation(s)  Drug Interaction(s) (clinically significant)     Duplicate Therapy     Allergy     No Medication Administration End Date     Incorrect Dose     Additional Drug Therapy Needed     Significant med changes from prior encounter (inform family/care partners about these prior to discharge).    Other       Clinically significant medication issues were identified that warrant physician communication and completion of prescribed/recommended actions by midnight of the next day:  No  Name of provider notified for urgent issues identified:   Provider Method of Notification:     Pharmacist comments:   Time spent performing this drug regimen review (minutes):  [place], PharmD, BCPS 04/08/2022 11:28 AM
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
## 2022-04-08 NOTE — Progress Notes (Signed)
Patient ID: [name] [name], male   DOB: 1980/06/26, 42 y.o.   MRN: [id number] [facility] Progress Note   Assessment/ Plan:   1. Acute kidney Injury: Nonoliguric at this time and secondary to rhabdomyolysis/pigment nephropathy.  Transitioned from CRRT (12/22 - 12/25) to intermittent hemodialysis with a right IJ TDC in place.  Labs from yesterday were significant for indication of renal recovery and I will await labs from today to determine ability to discontinue his dialysis catheter if renal recovery persists. 2.  Rhabdomyolysis: Status post fasciotomy of left hand with ongoing plastic surgery follow-up/management.  He underwent wound VAC placement to left arm wound yesterday. 3.  Anemia: Secondary to critical illness and surgical losses.  Continue to trend hemoglobin/hematocrit.  No indications for PRBC at this time. 4.  Acute CVA: Admission imaging showing subacute bilateral cerebellar infarcts with additional workup negative for cardiac embolic source. 5.  Left lower extremity pain/numbness: With hyperesthesia but without any evidence of local infection/process.  Subjective:   Continues to complain of pain in his left leg/thigh and some postoperative discomfort of his left arm.   Objective:   BP (!) 155/94 (BP Location: Right Arm)   Pulse (!) 102   Temp 98 F (36.7 C) (Oral)   Resp 17   Ht 5\' 7"  (1.702 m)   Wt 98.9 kg   SpO2 96%   BMI 34.15 kg/m   Intake/Output Summary (Last 24 hours) at 04/08/2022 0820 Last data filed at 04/08/2022 0645 Gross per 24 hour  Intake 1638.83 ml  Output 3445 ml  Net -1806.17 ml   Weight change: 0.3 kg  Physical Exam: Gen: Appears uncomfortable resting on right side in bed CVS: Pulse regular tachycardia, S1 and S2 normal Resp: Clear to auscultation bilaterally, no rales/rhonchi.  Right IJ TDC Abd: Soft, flat, nontender, nondistended Ext: 1+ left lower extremity edema with significant tenderness but without erythema.  Trace-1+  right lower extremity edema  Imaging: No results found.  Labs: BMET Recent Labs  Lab 04/02/22 0230 04/03/22 0602 04/04/22 0451 04/05/22 0651 04/06/22 0620 04/07/22 0604 04/07/22 1449 04/07/22 1951  NA 129* 129* 131* 135 136 137 139 139  K 3.8 4.2 5.1 4.8 5.7* 4.9 4.7 4.3  CL 93* 95* 97* 99 102 105 104 102  CO2 26 23 22 22 22 24   --  24  GLUCOSE 102* 127* 108* 106* 99 99 89 162*  BUN 34* 42* 45* 46* 51* 50* 52* 50*  CREATININE 4.35* 5.81* 5.71* 5.34* 5.63* 4.65* 4.80* 4.18*  CALCIUM 7.6* 8.0* 8.2* 8.2* 8.3* 7.9*  --  8.1*  PHOS 4.1 5.9* 6.9*  --  6.8* 6.5*  --  6.2*   CBC Recent Labs  Lab 04/05/22 0651 04/06/22 0620 04/07/22 0604 04/07/22 1449 04/07/22 1951  WBC 6.9 4.6 4.5  --  4.7  NEUTROABS 4.3  --   --   --   --   HGB 8.4* 7.7* 7.6* 7.1* 7.8*  HCT 25.9* 24.8* 24.1* 21.0* 25.1*  MCV 98.9 101.2* 99.6  --  100.0  PLT 395 300 212  --  214    Medications:     amLODipine  5 mg Oral Daily   aspirin EC  81 mg Oral Daily   bisacodyl  10 mg Rectal Daily   Chlorhexidine Gluconate Cloth  6 each Topical q morning   Chlorhexidine Gluconate Cloth  6 each Topical Q0600   fentaNYL (SUBLIMAZE) injection  50 mcg Intravenous Once   folic acid  1  mg Oral Daily   heparin  2,000 Units Dialysis Once in dialysis   heparin  5,000 Units Subcutaneous Q8H   hydrALAZINE  25 mg Oral Q8H   HYDROmorphone  2 mg Oral Q6H   insulin aspart  0-9 Units Subcutaneous Q4H   lidocaine  1 patch Transdermal QHS   multivitamin with minerals  1 tablet Oral Daily   pregabalin  75 mg Oral BID   sodium chloride flush  10-40 mL Intracatheter Q12H   thiamine  100 mg Oral Daily    Elmarie Shiley, MD 04/08/2022, 8:20 AM

## 2022-04-09 ENCOUNTER — Other Ambulatory Visit: Payer: Self-pay

## 2022-04-09 ENCOUNTER — Inpatient Hospital Stay (HOSPITAL_COMMUNITY): Payer: Medicaid Other

## 2022-04-09 DIAGNOSIS — N179 Acute kidney failure, unspecified: Secondary | ICD-10-CM

## 2022-04-09 DIAGNOSIS — I639 Cerebral infarction, unspecified: Secondary | ICD-10-CM | POA: Diagnosis not present

## 2022-04-09 DIAGNOSIS — T796XXS Traumatic ischemia of muscle, sequela: Secondary | ICD-10-CM | POA: Diagnosis not present

## 2022-04-09 DIAGNOSIS — S7402XS Injury of sciatic nerve at hip and thigh level, left leg, sequela: Secondary | ICD-10-CM

## 2022-04-09 DIAGNOSIS — I10 Essential (primary) hypertension: Secondary | ICD-10-CM | POA: Diagnosis not present

## 2022-04-09 HISTORY — PX: IR REMOVAL TUN CV CATH W/O FL: IMG2289

## 2022-04-09 LAB — CBC WITH DIFFERENTIAL/PLATELET
Abs Immature Granulocytes: 0.04 10*3/uL (ref 0.00–0.07)
Basophils Absolute: 0 10*3/uL (ref 0.0–0.1)
Basophils Relative: 1 %
Eosinophils Absolute: 0.1 10*3/uL (ref 0.0–0.5)
Eosinophils Relative: 3 %
HCT: 22.7 % — ABNORMAL LOW (ref 39.0–52.0)
Hemoglobin: 7.1 g/dL — ABNORMAL LOW (ref 13.0–17.0)
Immature Granulocytes: 1 %
Lymphocytes Relative: 34 %
Lymphs Abs: 1.2 10*3/uL (ref 0.7–4.0)
MCH: 30.7 pg (ref 26.0–34.0)
MCHC: 31.3 g/dL (ref 30.0–36.0)
MCV: 98.3 fL (ref 80.0–100.0)
Monocytes Absolute: 0.4 10*3/uL (ref 0.1–1.0)
Monocytes Relative: 10 %
Neutro Abs: 1.8 10*3/uL (ref 1.7–7.7)
Neutrophils Relative %: 51 %
Platelets: 178 10*3/uL (ref 150–400)
RBC: 2.31 MIL/uL — ABNORMAL LOW (ref 4.22–5.81)
RDW: 14.4 % (ref 11.5–15.5)
WBC: 3.6 10*3/uL — ABNORMAL LOW (ref 4.0–10.5)
nRBC: 0 % (ref 0.0–0.2)

## 2022-04-09 LAB — COMPREHENSIVE METABOLIC PANEL
ALT: 25 U/L (ref 0–44)
AST: 38 U/L (ref 15–41)
Albumin: 2.8 g/dL — ABNORMAL LOW (ref 3.5–5.0)
Alkaline Phosphatase: 52 U/L (ref 38–126)
Anion gap: 10 (ref 5–15)
BUN: 43 mg/dL — ABNORMAL HIGH (ref 6–20)
CO2: 23 mmol/L (ref 22–32)
Calcium: 8 mg/dL — ABNORMAL LOW (ref 8.9–10.3)
Chloride: 105 mmol/L (ref 98–111)
Creatinine, Ser: 2.79 mg/dL — ABNORMAL HIGH (ref 0.61–1.24)
GFR, Estimated: 28 mL/min — ABNORMAL LOW (ref >60)
Glucose, Bld: 97 mg/dL (ref 70–99)
Potassium: 3.7 mmol/L (ref 3.5–5.1)
Sodium: 138 mmol/L (ref 135–145)
Total Bilirubin: 0.4 mg/dL (ref 0.3–1.2)
Total Protein: 5.7 g/dL — ABNORMAL LOW (ref 6.5–8.1)

## 2022-04-09 MED ORDER — DOCUSATE SODIUM 100 MG PO CAPS
100.0000 mg | ORAL_CAPSULE | Freq: Two times a day (BID) | ORAL | Status: DC
  Administered 2022-04-09 – 2022-04-21 (×25): 100 mg via ORAL
  Filled 2022-04-09 (×25): qty 1

## 2022-04-09 MED ORDER — ALBUTEROL SULFATE HFA 108 (90 BASE) MCG/ACT IN AERS
2.0000 | INHALATION_SPRAY | Freq: Four times a day (QID) | RESPIRATORY_TRACT | Status: DC | PRN
  Administered 2022-04-09 – 2022-04-17 (×4): 2 via RESPIRATORY_TRACT
  Filled 2022-04-09: qty 6.7

## 2022-04-09 MED ORDER — OXYCODONE HCL 5 MG PO TABS
10.0000 mg | ORAL_TABLET | Freq: Once | ORAL | Status: AC
  Administered 2022-04-09: 10 mg via ORAL
  Filled 2022-04-09: qty 2

## 2022-04-09 MED ORDER — HYDROMORPHONE HCL 2 MG PO TABS
2.0000 mg | ORAL_TABLET | Freq: Four times a day (QID) | ORAL | Status: AC
  Administered 2022-04-09: 2 mg via ORAL
  Filled 2022-04-09: qty 1

## 2022-04-09 NOTE — Progress Notes (Signed)
Patient ID: [name] [name], male   DOB: [birth year], 42 y.o.   MRN: [id number] [facility] Progress Note   Assessment/ Plan:   1. Acute kidney Injury: Nonoliguric at this time and secondary to rhabdomyolysis/pigment nephropathy.  Transitioned from CRRT (12/22 - 12/25) to intermittent hemodialysis with a right IJ TDC in place. His labs over the last 72 hours show continued evidence of renal recovery and he does not have any additional hemodialysis requirements.  I will request for interventional radiology to discontinue his tunneled dialysis catheter at this time. 2.  Rhabdomyolysis: Status post fasciotomy of left hand with ongoing plastic surgery follow-up/management.  He underwent wound VAC placement to left arm wound yesterday. 3.  Anemia: Secondary to critical illness and surgical losses.  Continue to trend hemoglobin/hematocrit.  No indications for PRBC at this time. 4.  Acute CVA: Admission imaging showing subacute bilateral cerebellar infarcts with additional workup negative for cardiac embolic source.  Now admitted to CIR with intensive physiatry. 5.  Left lower extremity pain/numbness: With hyperesthesia but without any evidence of local infection/process.  With ongoing renal recovery, nephrology service will sign off and remain available for questions or concerns.  He does not need scheduled renal follow-up and can resume following up with his primary care provider upon discharge.  Outpatient follow-up/labs with PCP will be instrumental in deciding if he needs future renal follow-up.  Subjective:   Complains of some right shoulder discomfort along with postoperative left arm pain.  Still having intermittent left neck/thigh discomfort.  Objective:   BP 138/87 (BP Location: Right Arm)   Pulse 98   Temp 98 F (36.7 C)   Resp 18   SpO2 98%   Intake/Output Summary (Last 24 hours) at 04/09/2022 0848 Last data filed at 04/09/2022 6803 Gross per 24 hour  Intake 10.84  ml  Output 700 ml  Net -689.16 ml   Weight change:   Physical Exam: Gen: Appears to be somewhat uncomfortable sitting up on the side of his bed working with PT CVS: Pulse regular tachycardia, S1 and S2 normal Resp: Clear to auscultation bilaterally, no rales/rhonchi.  Right IJ TDC in place Abd: Soft, flat, nontender, nondistended Ext: Trace lower extremity edema  Imaging: No results found.  Labs: BMET Recent Labs  Lab 04/03/22 0602 04/04/22 0451 04/05/22 2122 04/06/22 0620 04/07/22 0604 04/07/22 1449 04/07/22 1951 04/09/22 0537  NA 129* 131* 135 136 137 139 139 138  K 4.2 5.1 4.8 5.7* 4.9 4.7 4.3 3.7  CL 95* 97* 99 102 105 104 102 105  CO2 23 22 22 22 24   --  24 23  GLUCOSE 127* 108* 106* 99 99 89 162* 97  BUN 42* 45* 46* 51* 50* 52* 50* 43*  CREATININE 5.81* 5.71* 5.34* 5.63* 4.65* 4.80* 4.18* 2.79*  CALCIUM 8.0* 8.2* 8.2* 8.3* 7.9*  --  8.1* 8.0*  PHOS 5.9* 6.9*  --  6.8* 6.5*  --  6.2*  --    CBC Recent Labs  Lab 04/05/22 0651 04/06/22 0620 04/07/22 0604 04/07/22 1449 04/07/22 1951 04/09/22 0537  WBC 6.9 4.6 4.5  --  4.7 3.6*  NEUTROABS 4.3  --   --   --   --  1.8  HGB 8.4* 7.7* 7.6* 7.1* 7.8* 7.1*  HCT 25.9* 24.8* 24.1* 21.0* 25.1* 22.7*  MCV 98.9 101.2* 99.6  --  100.0 98.3  PLT 395 300 212  --  214 178    Medications:     amLODipine  5 mg  Oral Daily   aspirin EC  81 mg Oral Daily   Chlorhexidine Gluconate Cloth  6 each Topical q morning   folic acid  1 mg Oral Daily   heparin  5,000 Units Subcutaneous Q8H   hydrALAZINE  25 mg Oral Q8H   HYDROmorphone  2 mg Oral Q6H   lidocaine  1 patch Transdermal QHS   multivitamin with minerals  1 tablet Oral Daily   nortriptyline  10 mg Oral QHS   pregabalin  75 mg Oral TID   sodium chloride flush  10-40 mL Intracatheter Q12H   thiamine  100 mg Oral Daily    Elmarie Shiley, MD 04/09/2022, 8:48 AM
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
## 2022-04-09 NOTE — Procedures (Signed)
Successful removal of right IJ tunneled HD catheter.   After obtaining consent and performing a time-out, the right upper chest was prepped and draped in the normal sterile fashion. The heparin was removed from both ports. 1% lidocaine was used for local anesthesia. Using gentle manual traction the cuff of the catheter was exposed and the catheter was removed in its entirety. Pressure was held until hemostasis was obtained. A sterile dressing was applied. The patient tolerated the procedure well with no immediate complications.   Soyla Dryer, [place] [phone] 04/09/2022, 10:46 AM

## 2022-04-09 NOTE — Progress Notes (Signed)
PROGRESS NOTE   Subjective/Complaints: Pt rested last night. Sleeping soundly. Left foot still tender  ROS: Patient denies fever, rash, sore throat, blurred vision, dizziness, nausea, vomiting, diarrhea, cough, shortness of breath or chest pain,  headache, or mood change.    Objective:   No results found. Recent Labs    04/07/22 1951 04/09/22 0537  WBC 4.7 3.6*  HGB 7.8* 7.1*  HCT 25.1* 22.7*  PLT 214 178   Recent Labs    04/07/22 1951 04/09/22 0537  NA 139 138  K 4.3 3.7  CL 102 105  CO2 24 23  GLUCOSE 162* 97  BUN 50* 43*  CREATININE 4.18* 2.79*  CALCIUM 8.1* 8.0*    Intake/Output Summary (Last 24 hours) at 04/09/2022 2542 Last data filed at 04/08/2022 2340 Gross per 24 hour  Intake 10 ml  Output 700 ml  Net -690 ml        Physical Exam: Vital Signs Blood pressure 138/87, pulse 98, temperature 98 F (36.7 C), resp. rate 18, SpO2 98 %.  General:  resting, No apparent distress HEENT: Head is normocephalic, atraumatic, PERRLA, EOMI, sclera anicteric, oral mucosa pink and moist, dentition intact, ext ear canals clear,  Neck: Supple without JVD or lymphadenopathy Heart: Reg rate and rhythm. No murmurs rubs or gallops Chest: CTA bilaterally without wheezes, rales, or rhonchi; no distress Abdomen: Soft, non-tender, non-distended, bowel sounds positive. Extremities: No clubbing, cyanosis, or edema. Pulses are 2+ Psych: Pt's affect is appropriate. Pt is cooperative Skin: Clean and intact without signs of breakdown Neuro:  oriented to person, place. Just waking up. Moves all 4's. Left foot limited by weakness and pain. Wiggles fingers Left hand Musculoskeletal: left arm splinted.     Assessment/Plan: 1. Functional deficits which require 3+ hours per day of interdisciplinary therapy in a comprehensive inpatient rehab setting. Physiatrist is providing close team supervision and 24 hour management of active  medical problems listed below. Physiatrist and rehab team continue to assess barriers to discharge/monitor patient progress toward functional and medical goals  Care Tool:  Bathing              Bathing assist       Upper Body Dressing/Undressing Upper body dressing        Upper body assist      Lower Body Dressing/Undressing Lower body dressing            Lower body assist       Toileting Toileting    Toileting assist       Transfers Chair/bed transfer  Transfers assist           Locomotion Ambulation   Ambulation assist              Walk 10 feet activity   Assist           Walk 50 feet activity   Assist           Walk 150 feet activity   Assist           Walk 10 feet on uneven surface  activity   Assist           Wheelchair  Assist               Wheelchair 50 feet with 2 turns activity    Assist            Wheelchair 150 feet activity     Assist          Blood pressure 138/87, pulse 98, temperature 98 F (36.7 C), resp. rate 18, SpO2 98 %.  Medical Problem List and Plan: 1. Functional deficits secondary to bilateral acute/subacute cerebellar infarcts; left forearm compartment syndrome             -patient may shower if left arm covered             -ELOS/Goals: 12-14 days, mod I to supervision goals             -will hold off on PRAFO LLE for now due pain/sensitivity  -Patient is beginning CIR therapies today including PT and OT  2.  Antithrombotics: -DVT/anticoagulation:  Pharmaceutical: Heparin             -antiplatelet therapy: aspirin 81 mg daily   3. Pain Management: Tylenol, Flexeril, Norco as needed -increase Lyrica to 75 mg TID, add nortriptyline 10 mg q HS, Lidoderm             -continue scheduled dilaudid for now with plan to taper--will decrease to 2mg  1/10 4. Mood/Behavior/Sleep: LCSW to evaluate and provide emotional support             -antipsychotic  agents: n/a   5. Neuropsych/cognition: This patient is capable of making decisions on  his own behalf.             -SLP eval   6. Skin/Wound Care: Routine skin care checks             -left forearm woundVAC per plastic surgery   7. Fluids/Electrolytes/Nutrition: Strict Is and Os and follow-up chemistries             -renal diet with fluid restriction             -hypoalbuminemia: continue protein supplements             -continue thiamine, folate supplementation   8: Polysubstance abuse: cessation counseling   9: Left forearm compartment syndrome status post fasciotomies 12/21 Dr. Fredna Dow             -wound management per plastic surgery. Myriad sheet placement yesterday             -carpal tunnel release left hand also has been performed             -continue ROM exercises, splint, limit weight bearing to 10 lbs.   10: Acute kidney injury: nephrology following; follow BMP   1/10 Cr falling nicely. Down to 2.79 today 11: Rhabdomyolysis: CK down to 906   12: Shock liver/elevated LFTs,     -WNL 1/10 13: Left gluteal myositis, ongoing pain: Continue AFO for foot drop             -outpatient NCV/EMG to assess for potential sciatic nv injury             -ROM without restrictions   14: Hyperphosphatemia   15: Anemia,acute blood loss/hemodilution/HD, s/p 1 unit PCs: follow-up CBC   -1/10 hgb down to 7.1--no gross blood loss  -continue to monitor 16: Hypertension: monitor TID and prn             -continue Norvasc 5 mg daily             -  hydralazine 25 mg TID   -1/10 bp controlled. Pain component  LOS: 1 days A FACE TO FACE EVALUATION WAS PERFORMED  [name] [name] 04/09/2022, 8:21 AM
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
## 2022-04-09 NOTE — Clinical Note (Incomplete)
Patient verbalizing increase pain to left leg unable to adminster Prn secondary tp max dosage of medication of Tylenod derivate

## 2022-04-09 NOTE — Discharge Instructions (Addendum)
Inpatient Rehab Discharge Instructions  [name] [name] Discharge date and time:  04/21/2022  Activities/Precautions/ Functional Status: Activity: no lifting, driving, or strenuous exercise until cleared by MD Diet: regular diet Wound Care: keep wound clean and dry. VAC dressings changes as per plastic surgery. Functional status:  ___ No restrictions     ___ Walk up steps independently ___ 24/7 supervision/assistance   ___ Walk up steps with assistance _x__ Intermittent supervision/assistance  ___ Bathe/dress independently ___ Walk with walker     ___ Bathe/dress with assistance ___ Walk Independently    ___ Shower independently ___ Walk with assistance    _x__ Shower with assistance _x__ No alcohol     ___ Return to work/school ________  Special Instructions: No driving, alcohol consumption or tobacco use.  STROKE/TIA DISCHARGE INSTRUCTIONS SMOKING Cigarette smoking nearly doubles your risk of having a stroke & is the single most alterable risk factor  If you smoke or have smoked in the last 12 months, you are advised to quit smoking for your health. Most of the excess cardiovascular risk related to smoking disappears within a year of stopping. Ask you doctor about anti-smoking medications [place] Quit Line: 1-800-QUIT NOW Free Smoking Cessation Classes [phone]  CHOLESTEROL Know your levels; limit fat & cholesterol in your diet  Lipid Panel     Component Value Date/Time   CHOL 172 03/22/2022 0325   TRIG 422 (H) 03/22/2022 0325   HDL 33 (L) 03/22/2022 0325   CHOLHDL 5.2 03/22/2022 0325   VLDL UNABLE TO CALCULATE IF TRIGLYCERIDE OVER 400 mg/dL 03/22/2022 0325   LDLCALC NOT CALCULATED 03/22/2022 0325     Many patients benefit from treatment even if their cholesterol is at goal. Goal: Total Cholesterol (CHOL) less than 160 Goal:  Triglycerides (TRIG) less than 150 Goal:  HDL greater than 40 Goal:  LDL (LDLCALC) less than 100   BLOOD PRESSURE American Stroke  Association blood pressure target is less that 120/80 mm/Hg  Your discharge blood pressure is:  BP: 122/83 Monitor your blood pressure Limit your salt and alcohol intake Many individuals will require more than one medication for high blood pressure  DIABETES (A1c is a blood sugar average for last 3 months) Goal HGBA1c is under 7% (HBGA1c is blood sugar average for last 3 months)  Diabetes: No known diagnosis of diabetes    Lab Results  Component Value Date   HGBA1C 4.6 (L) 03/21/2022    Your HGBA1c can be lowered with medications, healthy diet, and exercise. Check your blood sugar as directed by your physician Call your physician if you experience unexplained or low blood sugars.  PHYSICAL ACTIVITY/REHABILITATION Goal is 30 minutes at least 4 days per week  Activity: Increase activity slowly, Therapies: Physical Therapy: Outpatient, Occupational Therapy: Outpatient, and Speech Therapy: Outpatient Return to work: when cleared by MD Activity decreases your risk of heart attack and stroke and makes your heart stronger.  It helps control your weight and blood pressure; helps you relax and can improve your mood. Participate in a regular exercise program. Talk with your doctor about the best form of exercise for you (dancing, walking, swimming, cycling).  DIET/WEIGHT Goal is to maintain a healthy weight  Your discharge diet is:  Diet Order             Diet regular Room service appropriate? Yes; Fluid consistency: Thin  Diet effective now                  thin liquids Your  height is:  Height: 5\' 7"  (170.2 cm) Your current weight is: Weight: 98.5 kg Your Body Mass Index (BMI) is:  BMI (Calculated): 34 Following the type of diet specifically designed for you will help prevent another stroke. Your goal weight range is:   Your goal Body Mass Index (BMI) is 19-24. Healthy food habits can help reduce 3 risk factors for stroke:  High cholesterol, hypertension, and excess weight.  RESOURCES  Stroke/Support Group:  Call [phone]   STROKE EDUCATION PROVIDED/REVIEWED AND GIVEN TO PATIENT Stroke warning signs and symptoms How to activate emergency medical system (call 911). Medications prescribed at discharge. Need for follow-up after discharge. Personal risk factors for stroke. Pneumonia vaccine given: No Flu vaccine given: No My questions have been answered, the writing is legible, and I understand these instructions.  I will adhere to these goals & educational materials that have been provided to me after my discharge from the hospital.     COMMUNITY REFERRALS UPON DISCHARGE:    Outpatient: PT      OT     ST                  Agency: [facility]     Phone: [phone]              Appointment Date/Time: *Please expect follow-up within 7-10 business to schedule your appointment. If you have not received follow-up, be sure to make contact with the site directly.*  Medical Equipment/Items Ordered:wound vac                                                 Agency/Supplier: [employer] #[phone]  Medical Equipment/Items Ordered:tub transfer bench, drop arm bedside commode, wheelchair, and rolling walker                                                 Agency/Supplier:[employer] [phone]  GENERAL COMMUNITY RESOURCES FOR PATIENT/FAMILY:  A referral for personal care services was submitted to your insurance with the LTSS Department. Please expect follow-up from a nurse to schedule a home visit to complete an assessment and determine care needs. To check status, please contact [phone]. This number is the number listed on your insurance card, and you will need to ask for case management or assigned case manager.     My questions have been answered and I understand these instructions. I will adhere to these goals and the provided educational materials after my discharge from the hospital.  Patient/Caregiver Signature _______________________________ Date  __________  Clinician Signature _______________________________________ Date __________  Please bring this form and your medication list with you to all your follow-up doctor's appointments.    Psychiatry discharge instruction:  Please call 911, [phone] or go to the nearest ED if ou have suicidal thoughts or otherwise feel you are a danger to yourself or others. For followup appointments, your options are to go to [facility], or to call the [facility] center on [address] to see if they are currently seeing patients from [facility]. The contact information for these is below:   We are going to try to place a direct referral to [facility] in case these options don't work  out or that is a better fit.   [facility]: Address: [address] [place], [place] [address] Hours:  Open 24 hours Phone: [phone] - call for current walk-in hours and to see if they see patients from [facility]. You can go at any time for psychiatric emergencies/urgent care, but there are some restrictions on outpatients.   [facility]: [place], [place] [address] Outpatient Hours: Mon-Fri 8AM-5PM  Phone: [phone] Fax: [phone]  We talked about trazodone being an optional medication (many people don't need sleep aids when they are sleeping in their own bed). We also talked about either cymbalta (duloxetine) or pamelor (nortriptyline) being discontinued if the dose of the other one is increased as these are pretty similar.
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
## 2022-04-09 NOTE — Anesthesia Postprocedure Evaluation (Signed)
Anesthesia Post Note  Patient: [name] [name]  Procedure(s) Performed: Preparation of left volar wound with application of myriad (Left: Arm Upper) APPLICATION OF WOUND VAC (Left: Arm Upper)     Patient location during evaluation: PACU Anesthesia Type: General Level of consciousness: awake and alert Pain management: pain level controlled Vital Signs Assessment: post-procedure vital signs reviewed and stable Respiratory status: spontaneous breathing, nonlabored ventilation, respiratory function stable and patient connected to nasal cannula oxygen Cardiovascular status: blood pressure returned to baseline and stable Postop Assessment: no apparent nausea or vomiting Anesthetic complications: no   No notable events documented.  Last Vitals:  Vitals:   04/08/22 0721 04/08/22 1536  BP: (!) 155/94 (!) 134/92  Pulse: (!) 102 (!) 101  Resp: 17 17  Temp: 36.7 C 36.6 C  SpO2: 96% 99%    Last Pain:  Vitals:   04/08/22 1536  TempSrc: Oral  PainSc:                  Tiajuana Amass

## 2022-04-09 NOTE — Progress Notes (Signed)
Inpatient Rehabilitation  Patient information reviewed and entered into eRehab system by Xayla Puzio Roan [place], OTR/L, Rehab Quality Coordinator.   Information including medical coding, functional ability and quality indicators will be reviewed and updated through discharge.   

## 2022-04-09 NOTE — Progress Notes (Signed)
Patient ID: [name] [name], male   DOB: [birth year], 42 y.o.   MRN: [id number]  1335- SW made efforts to make contact with pt sister [name] [name], however, VM not set up.   47- SW returned phone call to pt sister [name] [name]. Prefers to be called Rodena Piety. SW introduced self, explained role, and discuss discharge process. She confirms that pt will d/c to her home. States she works third shift, and her boyfriend will be here with him during the day. She is only off on Monday and Thursday. SW informed will follow-up with ELOS, and again with  updates from team conference. SW encouraged follow-up if needed.   [name], MSW, [place] Office: [phone] Cell: [phone] Fax: [phone]

## 2022-04-09 NOTE — Plan of Care (Signed)
  Problem: RH Swallowing Goal: LTG Patient will consume least restrictive diet using compensatory strategies with assistance (SLP) Description: LTG:  Patient will consume least restrictive diet using compensatory strategies with assistance (SLP) Flowsheets (Taken 04/09/2022 1246) LTG: Pt Patient will consume least restrictive diet using compensatory strategies with assistance of (SLP): Supervision   Problem: RH Attention Goal: LTG Patient will demonstrate this level of attention during functional activites (SLP) Description: LTG:  Patient will will demonstrate this level of attention during functional activites (SLP) Flowsheets (Taken 04/09/2022 1246) Patient will demonstrate during cognitive/linguistic activities the attention type of: Sustained LTG: Patient will demonstrate this level of attention during cognitive/linguistic activities with assistance of (SLP): Supervision Number of minutes patient will demonstrate attention during cognitive/linguistic activities: 30   Problem: RH Awareness Goal: LTG: Patient will demonstrate awareness during functional activites type of (SLP) Description: LTG: Patient will demonstrate awareness during functional activites type of (SLP) Flowsheets (Taken 04/09/2022 1246) Patient will demonstrate during cognitive/linguistic activities awareness type of:  Emergent  Anticipatory LTG: Patient will demonstrate awareness during cognitive/linguistic activities with assistance of (SLP): Supervision   Problem: RH Problem Solving Goal: LTG Patient will demonstrate problem solving for (SLP) Description: LTG:  Patient will demonstrate problem solving for basic/complex daily situations with cues  (SLP) Flowsheets (Taken 04/09/2022 1247) LTG: Patient will demonstrate problem solving for (SLP): Basic daily situations LTG Patient will demonstrate problem solving for: Supervision   Problem: RH Memory Goal: LTG Patient will demonstrate ability for day to day  (SLP) Description: LTG:   Patient will demonstrate ability for day to day recall/carryover during cognitive/linguistic activities with assist  (SLP) Flowsheets (Taken 04/09/2022 1247) LTG: Patient will demonstrate ability for day to day recall: New information LTG: Patient will demonstrate ability for day to day recall/carryover during cognitive/linguistic activities with assist (SLP): Minimal Assistance - Patient > 75% Goal: LTG Patient will use memory compensatory aids to (SLP) Description: LTG:  Patient will use memory compensatory aids to recall biographical/new, daily complex information with cues (SLP) Flowsheets (Taken 04/09/2022 1247) LTG: Patient will use memory compensatory aids to (SLP): Supervision

## 2022-04-10 DIAGNOSIS — I10 Essential (primary) hypertension: Secondary | ICD-10-CM | POA: Diagnosis not present

## 2022-04-10 DIAGNOSIS — N179 Acute kidney failure, unspecified: Secondary | ICD-10-CM | POA: Diagnosis not present

## 2022-04-10 DIAGNOSIS — I639 Cerebral infarction, unspecified: Secondary | ICD-10-CM | POA: Diagnosis not present

## 2022-04-10 DIAGNOSIS — T796XXS Traumatic ischemia of muscle, sequela: Secondary | ICD-10-CM | POA: Diagnosis not present

## 2022-04-10 MED ORDER — DULOXETINE HCL 30 MG PO CPEP: 30.0000 mg | ORAL_CAPSULE | Freq: Every day | ORAL | Status: DC

## 2022-04-10 MED ORDER — OXYCODONE HCL 5 MG PO TABS
5.0000 mg | ORAL_TABLET | ORAL | Status: DC | PRN
  Administered 2022-04-10 – 2022-04-11 (×6): 10 mg via ORAL
  Filled 2022-04-10 (×7): qty 2

## 2022-04-10 MED ORDER — NORTRIPTYLINE HCL 25 MG PO CAPS
25.0000 mg | ORAL_CAPSULE | Freq: Every day | ORAL | Status: DC
  Administered 2022-04-10 – 2022-04-13 (×4): 25 mg via ORAL
  Filled 2022-04-10 (×4): qty 1

## 2022-04-10 MED ORDER — PREDNISONE 20 MG PO TABS
60.0000 mg | ORAL_TABLET | Freq: Once | ORAL | Status: AC
  Administered 2022-04-10: 60 mg via ORAL
  Filled 2022-04-10: qty 3

## 2022-04-10 MED ORDER — DULOXETINE HCL 20 MG PO CPEP
20.0000 mg | ORAL_CAPSULE | Freq: Every day | ORAL | Status: DC
  Administered 2022-04-10 – 2022-04-15 (×6): 20 mg via ORAL
  Filled 2022-04-10 (×6): qty 1

## 2022-04-10 NOTE — Progress Notes (Signed)
Physical Therapy Session Note  Patient Details  Name: [name] [name] MRN: [id number] Date of Birth: December 28, 1980  Today's Date: 04/10/2022 PT Individual Time: 1345-1445 PT Individual Time Calculation (min): 60 min   Short Term Goals: Week 1:  PT Short Term Goal 1 (Week 1): Pt will perform bed mobility with CGA PT Short Term Goal 2 (Week 1): Pt will transfer to [facility] with CGA PT Short Term Goal 3 (Week 1): Pt will ambulate 17ft with min assist and LRAD PT Short Term Goal 4 (Week 1): Pt will propell WC >143ft with supervision assist using hemi technique  Skilled Therapeutic Interventions/Progress Updates:     Patient in bed upon PT arrival. Patient alert and agreeable to PT session. Patient reported 10/10 L foot/leg pain during session, RN made aware and provided pain medication during session. PT provided repositioning, rest breaks, and distraction as pain interventions throughout session.   Patient reports limited response to interventions for reduced pain, has tied ice, heat, and massage all with brief relief then return to elevated pain. Patient agreeable to trial massage for edema management and desensitization to touch/pressure x10 min, discussed patient's hobbies and family throughout as distraction from pain with good response. Progressed to DF stretch 3x20-30 sec. Patient with Foot-up brace in the room. Donned foot-up brace, adjusting to low ankle placement for reduced DF stretch to patient tolerance. Patient tolerated brace throughout session.   Therapeutic Activity: Bed Mobility: Patient performed supine to/from sit with supervision with use of hospital bed features. Provided verbal cues for weight bearing precautions on L hand and safety due to impulsivity with IV and wound vac lines. Transfers: Patient performed squat pivot bed>w/c with CGA. Provided verbal cues for w/c set-up and weight bearing limitation on the L hand. Patient maintained NWB on L lower extremity throughout.  Patient performed stand pivot using hemi-walker with mod A, able to tolerate toe-touch weight bearing on L with poor safety awareness hoping back to the bed with uncontrolled descent.   Patient sat in the w/c >10 min. Positioned patient with L 1/2 lap tray, retrieved during session with pillow for L upper extremity elevation and placed pillow at his feet for reduced pressure and increased sitting tolerance. Located 18"x16" w/c with plans to lower the chair to hemi-height to improve R hemi-technique w/c propulsion.   Patient in bed at end of session with breaks locked, bed alarm set, 4 rails up per patient request, and all needs within reach.   Therapy Documentation Precautions:  Precautions Precautions: Fall Precaution Comments: L forearm in dressing from fasciotomies and closure; TDC R IJ 1/2; 10 # weight bearing limitation per Dr Fredna Dow 1/3, no ROM restrictions Restrictions Weight Bearing Restrictions: Yes LUE Weight Bearing: Partial weight bearing LLE Weight Bearing: Partial weight bearing LLE Partial Weight Bearing Percentage or Pounds: no Other Position/Activity Restrictions: limit to 10# through LUE    Therapy/Group: Individual Therapy  [name] [name] PT, DPT, NCS, CBIS  04/10/2022, 3:15 PM

## 2022-04-10 NOTE — Progress Notes (Signed)
Occupational Therapy Session Note  Patient Details  Name: [name] [name] MRN: [id number] Date of Birth: 11-Jan-1981  Today's Date: 04/10/2022 OT Individual Time: 3709-6438 OT Individual Time Calculation (min): 54 min    Short Term Goals: Week 1:  OT Short Term Goal 1 (Week 1): Pt will transfer to toilet wiht LRAD and CGA OT Short Term Goal 2 (Week 1): Pt will compelte 2/3 steps of LB dressing OT Short Term Goal 3 (Week 1): Pt will don shirt wiht MIN A OT Short Term Goal 4 (Week 1): Pt will recall hemi dressing techniqes wiht MIN question cues  Skilled Therapeutic Interventions/Progress Updates:     Pt received in bed with 9 out of 10 pain in LLE. Education on types of pain, pain distribution, and K tape provided for pain relief. Pt tearful stating, "I feel like my leg is 'f*cked' and no one is doing anything about it." Provided therapeutic use of self and encouragement to participate to improve tolerance to activity. Pt resistant to any education complaining about pain medication regimen. Pt also wanting an image because he feels the pain has moved up to his groin "so I can't even sit up any more." Relayed all info to physician.  ADL: Pt educated on thread hurt limb first. Pt states, "I already know that." Educated on use of reacher to improve reach/comfort when dressing and pt very resistant stating, " I Know I cannot do that yet. Look at how my leg is moving. I can barely move my arm. I can barely feed myself." Educated that the movement with the arm is very different than the movement to reach down towards the floor. OT exits to grab K tape, when back MD in room and pt sitting EOB. After MD leaves pt completes oral care and threads BLE into pants with hemi strategy per cue without use of reacher and 2 attempts.    Therapeutic activity See above for topics of education/coordination of care. Discussed with pt after dressing that there needs to be trust that this clinician has  expertise in analyzing movement to know what is appropriate challenge for pt and pt to not just immediately assume he cannot complete an activity. Little evidence of learning, but continued rapport evident as pt states, "thank you for you help" and "I figured I could at least try to put my pants on for you."  Pt left at end of session in bed with exit alarm on, call light in reach and all needs met   Therapy Documentation Precautions:  Precautions Precautions: Fall Precaution Comments: L forearm in dressing from fasciotomies and closure; TDC R IJ 1/2; 10 # weight bearing limitation per Dr Fredna Dow 1/3, no ROM restrictions Restrictions Weight Bearing Restrictions: Yes LUE Weight Bearing: Partial weight bearing LLE Weight Bearing: Partial weight bearing LLE Partial Weight Bearing Percentage or Pounds: no weight bearing limitation on L LE Other Position/Activity Restrictions: limit to 10# through LUE  Therapy/Group: Individual Therapy  [name] 04/10/2022, 6:52 AM

## 2022-04-10 NOTE — Progress Notes (Signed)
PROGRESS NOTE   Subjective/Complaints: Pt rested last night. Sleeping soundly. Left foot still tender  ROS: Patient denies fever, rash, sore throat, blurred vision, dizziness, nausea, vomiting, diarrhea, cough, shortness of breath or chest pain,  headache, or mood change.    Objective:   IR Removal Tun Cv Cath W/O FL  Result Date: 04/09/2022 INDICATION: Patient with acute renal failure secondary to rhabdomyolysis requiring hemodialysis via a tunneled HD catheter placed in IR 04/01/22. He has experienced renal recovery and does not need any additional hemodialysis treatments. Interventional radiology asked to remove tunneled dialysis catheter. EXAM: REMOVAL TUNNELED CENTRAL VENOUS CATHETER MEDICATIONS: None ANESTHESIA/SEDATION: None FLUOROSCOPY: None COMPLICATIONS: None immediate. PROCEDURE: Informed written consent was obtained from the patient after a thorough discussion of the procedural risks, benefits and alternatives. All questions were addressed. Maximal Sterile Barrier Technique was utilized including caps, mask, sterile gowns, sterile gloves, sterile drape, hand hygiene and skin antiseptic. A timeout was performed prior to the initiation of the procedure. The patient's right chest and catheter was prepped and draped in a normal sterile fashion. Heparin was removed from both ports of catheter. 1% lidocaine was used for local anesthesia. Using gentle manual traction the cuff of the catheter was exposed and the catheter was removed in it's entirety. Pressure was held till hemostasis was obtained. A sterile dressing was applied. The patient tolerated the procedure well with no immediate complications. IMPRESSION: Successful catheter removal as described above. Read by: Soyla Dryer, NP Electronically Signed   By: Ruthann Cancer M.D.   On: 04/09/2022 12:36   Recent Labs    04/07/22 1951 04/09/22 0537  WBC 4.7 3.6*  HGB 7.8* 7.1*  HCT  25.1* 22.7*  PLT 214 178   Recent Labs    04/07/22 1951 04/09/22 0537  NA 139 138  K 4.3 3.7  CL 102 105  CO2 24 23  GLUCOSE 162* 97  BUN 50* 43*  CREATININE 4.18* 2.79*  CALCIUM 8.1* 8.0*    Intake/Output Summary (Last 24 hours) at 04/10/2022 0927 Last data filed at 04/10/2022 0755 Gross per 24 hour  Intake 2694.05 ml  Output 3525 ml  Net -830.95 ml        Physical Exam: Vital Signs Blood pressure (!) 158/94, pulse (!) 103, temperature 97.6 F (36.4 C), temperature source Oral, resp. rate 16, height 5\' 7"  (1.702 m), weight 98.9 kg, SpO2 98 %.  General:  resting, No apparent distress HEENT: Head is normocephalic, atraumatic, PERRLA, EOMI, sclera anicteric, oral mucosa pink and moist, dentition intact, ext ear canals clear,  Neck: Supple without JVD or lymphadenopathy Heart: Reg rate and rhythm. No murmurs rubs or gallops Chest: CTA bilaterally without wheezes, rales, or rhonchi; no distress Abdomen: Soft, non-tender, non-distended, bowel sounds positive. Extremities: No clubbing, cyanosis, or edema. Pulses are 2+ Psych: Pt's affect is appropriate. Pt is cooperative Skin: numerous tats. Left arm dressed. A few abrasions healing left leg. Neuro: Alert and oriented x 3. Normal insight and awareness. Intact Memory. Normal language and speech. Cranial nerve exam unremarkable  Moves all 4's. Left foot limited by weakness and pain. Wiggles fingers Left hand Musculoskeletal: left arm splinted. Left leg tender proximally at thigh  with more pain distally at foot.      Assessment/Plan: 1. Functional deficits which require 3+ hours per day of interdisciplinary therapy in a comprehensive inpatient rehab setting. Physiatrist is providing close team supervision and 24 hour management of active medical problems listed below. Physiatrist and rehab team continue to assess barriers to discharge/monitor patient progress toward functional and medical goals  Care Tool:  Bathing    Body  parts bathed by patient: Left arm, Chest, Abdomen, Front perineal area, Buttocks, Right upper leg, Left upper leg   Body parts bathed by helper: Right arm, Right lower leg, Left lower leg     Bathing assist Assist Level: Moderate Assistance - Patient 50 - 74%     Upper Body Dressing/Undressing Upper body dressing   What is the patient wearing?: Pull over shirt    Upper body assist Assist Level: Maximal Assistance - Patient 25 - 49%    Lower Body Dressing/Undressing Lower body dressing      What is the patient wearing?: Pants     Lower body assist Assist for lower body dressing: Total Assistance - Patient < 25%     Toileting Toileting    Toileting assist Assist for toileting: Maximal Assistance - Patient 25 - 49%     Transfers Chair/bed transfer  Transfers assist     Chair/bed transfer assist level: Moderate Assistance - Patient 50 - 74%     Locomotion Ambulation   Ambulation assist      Assist level: Moderate Assistance - Patient 50 - 74% Assistive device: Parallel bars Max distance: 3   Walk 10 feet activity   Assist  Walk 10 feet activity did not occur: Safety/medical concerns        Walk 50 feet activity   Assist Walk 50 feet with 2 turns activity did not occur: Safety/medical concerns         Walk 150 feet activity   Assist Walk 150 feet activity did not occur: Safety/medical concerns         Walk 10 feet on uneven surface  activity   Assist Walk 10 feet on uneven surfaces activity did not occur: Safety/medical concerns         Wheelchair     Assist Is the patient using a wheelchair?: Yes Type of Wheelchair: Manual    Wheelchair assist level: Minimal Assistance - Patient > 75% Max wheelchair distance: 150    Wheelchair 50 feet with 2 turns activity    Assist        Assist Level: Minimal Assistance - Patient > 75%   Wheelchair 150 feet activity     Assist      Assist Level: Minimal Assistance -  Patient > 75%   Blood pressure (!) 158/94, pulse (!) 103, temperature 97.6 F (36.4 C), temperature source Oral, resp. rate 16, height 5\' 7"  (1.702 m), weight 98.9 kg, SpO2 98 %.  Medical Problem List and Plan: 1. Functional deficits secondary to bilateral acute/subacute cerebellar infarcts; left forearm compartment syndrome             -patient may shower if left arm covered             -ELOS/Goals: 12-14 days, mod I to supervision goals             -will hold off on PRAFO LLE for now due pain/sensitivity  --Continue CIR therapies including PT, OT  2.  Antithrombotics: -DVT/anticoagulation:  Pharmaceutical: Heparin             -  antiplatelet therapy: aspirin 81 mg daily   3. Pain Management: Tylenol, Flexeril, Norco as needed -increased  Lyrica to 75 mg TID, added nortriptyline 10 mg q HS, Lidoderm             -  scheduled dilaudid  decreased to 2mg  1/10  -1/11- increase nortriptyline to 25mg  qhs, same lyrica   -begin trial of cymbalta 20mg  daily   -change norco to oxycodone for breakthrough   -prednisone burst starting at 60mg  today (spoke with surgical team who's ok with that) 4. Mood/Behavior/Sleep: LCSW to evaluate and provide emotional support             -antipsychotic agents: n/a   5. Neuropsych/cognition: This patient is capable of making decisions on  his own behalf.             -SLP eval   6. Skin/Wound Care: Routine skin care checks             -left forearm woundVAC per plastic surgery   7. Fluids/Electrolytes/Nutrition: Strict Is and Os and follow-up chemistries             -renal diet with fluid restriction             -hypoalbuminemia: continue protein supplements             -continue thiamine, folate supplementation   8: Polysubstance abuse: cessation counseling   9: Left forearm compartment syndrome status post fasciotomies 12/21 Dr. Fredna Dow             -wound management per plastic surgery. Myriad sheet placement yesterday             -carpal tunnel release  left hand also has been performed             -continue ROM exercises, splint, limit weight bearing to 10 lbs.   10: Acute kidney injury: nephrology following; follow BMP   1/10 Cr falling nicely. Down to 2.79   1/11 RIJ removed yesterday 11: Rhabdomyolysis: CK down to 906   12: Shock liver/elevated LFTs,     -WNL 1/10 13: Left gluteal myositis, ongoing pain: Continue AFO for foot drop             -outpatient NCV/EMG to assess for potential sciatic nv injury             -ROM without restrictions   -see pain mgt above 14: Hyperphosphatemia   15: Anemia,acute blood loss/hemodilution/HD, s/p 1 unit PCs: follow-up CBC   -1/10 hgb down to 7.1--no gross blood loss  -continue to monitor 16: Hypertension: monitor TID and prn             -continue Norvasc 5 mg daily             -hydralazine 25 mg TID   -1/10 bp controlled. Large Pain and anxiety component  LOS: 2 days A FACE TO FACE EVALUATION WAS PERFORMED  Meredith Staggers 04/10/2022, 9:27 AM
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
## 2022-04-10 NOTE — Progress Notes (Signed)
Occupational Therapy Session Note  Patient Details  Name: [name] [name] MRN: [id number] Date of Birth: May 12, 1980  Today's Date: 04/10/2022 OT Individual Time: 6333-5456 OT Individual Time Calculation (min): 60 min    Short Term Goals: Week 1:  OT Short Term Goal 1 (Week 1): Pt will transfer to toilet wiht LRAD and CGA OT Short Term Goal 2 (Week 1): Pt will compelte 2/3 steps of LB dressing OT Short Term Goal 3 (Week 1): Pt will don shirt wiht MIN A OT Short Term Goal 4 (Week 1): Pt will recall hemi dressing techniqes wiht MIN question cues  Skilled Therapeutic Interventions/Progress Updates:    Pt greeted semi-reclined in bed  with all lights off, eyes closed. OT turned on gentle lights and agreeable to OT treatment session but reported high pain in LLE. Pt reported need to urinate. Pt able to place urinal independently and had successful void. Pt completed bed mobility with supervision but use of bed rails. Pt completed lateral squat-pivot to wc on the R side due to high pain in his R LE. Pt describes pain as 10/10 numbness and tingling that runs down leg. Pillow placed under LE's on leg rests for pain management. Pt brought down to therapy gym in wc. Worked on weight bearing through R LE with partial squats x3. L UE ROM with focus on R finger flex ext of PIP's, DIP's, and MCP's.  Pt returned to room and left seated in wc with alarm belt on, call bell in reach, and nursing present.   Therapy Documentation Precautions:  Precautions Precautions: Fall Precaution Comments: L forearm in dressing from fasciotomies and closure; TDC R IJ 1/2; 10 # weight bearing limitation per Dr Fredna Dow 1/3, no ROM restrictions Restrictions Weight Bearing Restrictions: Yes LUE Weight Bearing: Partial weight bearing LLE Weight Bearing: Partial weight bearing LLE Partial Weight Bearing Percentage or Pounds: no Other Position/Activity Restrictions: limit to 10# through LUE Pain: Pain Assessment Pain  Scale: 0-10 Pain Score: 10-Worst pain ever Pain Type: Acute pain Pain Location: Leg Pain Orientation: Left Pain Descriptors / Indicators: Aching;Cramping Pain Frequency: Constant Pain Onset: On-going Pain Intervention(s): Repositioned   Therapy/Group: Individual Therapy  Valma Cava 04/10/2022, 11:48 AM

## 2022-04-10 NOTE — Progress Notes (Addendum)
Patient ID: [name] [name], male   DOB: November 01, 1980, 42 y.o.   MRN: [id number]  [id number] made efforts to make contact with pt sister Rodena Piety to inform on ELOS but unable to leave voice mail message. SW will continue to make efforts to contact.   *During assessment, pt disclosed while he has no formal diagnosis of depression or anxiety, he does believe he has it. He states he has a hx of cutting his face and also cutting both of his wrists (states he did this when he was not drunk) to just relieve stress/tension inside and it made him feel better. Reports doing this in front of his father, and  he did not go to hospital for any care. He also reports he would bang his head up against the wall as well. When completing [place] screening questionnaire he does wish he was dead and would not wake up but does not have a plan to self harm. Reports he would not do anything to harm himself, he only did these things to relieve stress. SW shared that it is possible he may meet with psychiatry. No objections noted. He could benefit from an assessment, medication recommendations and a referral for outpatient services. SW shared with medical team.   *SW received phone call from pt sister Rodena Piety to inform on ELOS.  Confirms upcoming surgery on 1/18. SW discussed SSDI. HAR acct shows SSDI application sent to [facility]   [name], MSW, [place] Office: [phone] Cell: [phone] Fax: [phone]

## 2022-04-10 NOTE — Care Management (Signed)
[facility] Individual Statement of Services  Patient Name:  [name] [name]  Date:  04/10/2022  Welcome to the [facility].  Our goal is to provide you with an individualized program based on your diagnosis and situation, designed to meet your specific needs.  With this comprehensive rehabilitation program, you will be expected to participate in at least 3 hours of rehabilitation therapies Monday-Friday, with modified therapy programming on the weekends.  Your rehabilitation program will include the following services:  Physical Therapy (PT), Occupational Therapy (OT), Speech Therapy (ST), 24 hour per day rehabilitation nursing, Therapeutic Recreaction (TR), Psychology, Neuropsychology, Care Coordinator, Rehabilitation Medicine, [place], and Other  Weekly team conferences will be held on Tuesdays to discuss your progress.  Your Inpatient Rehabilitation Care Coordinator will talk with you frequently to get your input and to update you on team discussions.  Team conferences with you and your family in attendance may also be held.  Expected length of stay: 2-2.5 weeks    Overall anticipated outcome: Supervision  Depending on your progress and recovery, your program may change. Your Inpatient Rehabilitation Care Coordinator will coordinate services and will keep you informed of any changes. Your Inpatient Rehabilitation Care Coordinator's name and contact numbers are listed  below.  The following services may also be recommended but are not provided by the [facility] will be made to provide these services after discharge if needed.  Arrangements include referral to agencies that provide these services.  Your insurance has been verified to be:  [place] Medicaid Healthy  [place]  Your primary doctor is:  No PCP listed  Pertinent information will be shared with your doctor and your insurance company.  Inpatient Rehabilitation Care Coordinator:  Cathleen Corti [phone] or ([phone]  Information discussed with and copy given to patient by: Rana Snare, 04/10/2022, 10:21 AM

## 2022-04-10 NOTE — Plan of Care (Signed)
  Problem: RH Balance Goal: LTG Patient will maintain dynamic sitting balance (PT) Description: LTG:  Patient will maintain dynamic sitting balance with assistance during mobility activities (PT) Flowsheets (Taken 04/10/2022 0526) LTG: Pt will maintain dynamic sitting balance during mobility activities with:: Independent Goal: LTG Patient will maintain dynamic standing balance (PT) Description: LTG:  Patient will maintain dynamic standing balance with assistance during mobility activities (PT) Flowsheets (Taken 04/10/2022 0526) LTG: Pt will maintain dynamic standing balance during mobility activities with:: Supervision/Verbal cueing   Problem: Sit to Stand Goal: LTG:  Patient will perform sit to stand with assistance level (PT) Description: LTG:  Patient will perform sit to stand with assistance level (PT) Flowsheets (Taken 04/10/2022 0526) LTG: PT will perform sit to stand in preparation for functional mobility with assistance level: Supervision/Verbal cueing   Problem: RH Bed Mobility Goal: LTG Patient will perform bed mobility with assist (PT) Description: LTG: Patient will perform bed mobility with assistance, with/without cues (PT). Flowsheets (Taken 04/10/2022 0526) LTG: Pt will perform bed mobility with assistance level of: Supervision/Verbal cueing   Problem: RH Bed to Chair Transfers Goal: LTG Patient will perform bed/chair transfers w/assist (PT) Description: LTG: Patient will perform bed to chair transfers with assistance (PT). Flowsheets (Taken 04/10/2022 0526) LTG: Pt will perform Bed to Chair Transfers with assistance level: Supervision/Verbal cueing   Problem: RH Car Transfers Goal: LTG Patient will perform car transfers with assist (PT) Description: LTG: Patient will perform car transfers with assistance (PT). Flowsheets (Taken 04/10/2022 0526) LTG: Pt will perform car transfers with assist:: Minimal Assistance - Patient > 75%   Problem: RH Furniture Transfers Goal: LTG  Patient will perform furniture transfers w/assist (OT/PT) Description: LTG: Patient will perform furniture transfers  with assistance (OT/PT). Flowsheets (Taken 04/10/2022 0526) LTG: Pt will perform furniture transfers with assist:: Supervision/Verbal cueing   Problem: RH Ambulation Goal: LTG Patient will ambulate in controlled environment (PT) Description: LTG: Patient will ambulate in a controlled environment, # of feet with assistance (PT). Flowsheets (Taken 04/10/2022 0526) LTG: Pt will ambulate in controlled environ  assist needed:: Supervision/Verbal cueing LTG: Ambulation distance in controlled environment: 107ft with LRAD Goal: LTG Patient will ambulate in home environment (PT) Description: LTG: Patient will ambulate in home environment, # of feet with assistance (PT). Flowsheets (Taken 04/10/2022 0526) LTG: Pt will ambulate in home environ  assist needed:: Supervision/Verbal cueing LTG: Ambulation distance in home environment: 4ft with LRAD   Problem: RH Wheelchair Mobility Goal: LTG Patient will propel w/c in controlled environment (PT) Description: LTG: Patient will propel wheelchair in controlled environment, # of feet with assist (PT) Flowsheets (Taken 04/10/2022 0526) LTG: Pt will propel w/c in controlled environ  assist needed:: Independent with assistive device LTG: Propel w/c distance in controlled environment: 143ft Goal: LTG Patient will propel w/c in home environment (PT) Description: LTG: Patient will propel wheelchair in home environment, # of feet with assistance (PT). Flowsheets (Taken 04/10/2022 0526) LTG: Pt will propel w/c in home environ  assist needed:: Independent with assistive device LTG: Propel w/c distance in home environment: 50   Problem: RH Stairs Goal: LTG Patient will ambulate up and down stairs w/assist (PT) Description: LTG: Patient will ambulate up and down # of stairs with assistance (PT) Flowsheets (Taken 04/10/2022 0526) LTG: Pt will ambulate  up/down stairs assist needed:: Minimal Assistance - Patient > 75% LTG: Pt will  ambulate up and down number of stairs: 5 steps with 1 rail to acces home

## 2022-04-10 NOTE — Progress Notes (Signed)
3 Days Post-Op  Subjective:  Status post preparation of left volar wound with application myriad and application of wound VAC.  The patient is doing well from the left upper extremity today.  He notes continued range of motion at the hand and fingers.  Objective: Vital signs in last 24 hours: Temp:  [97.6 F (36.4 C)-98 F (36.7 C)] 97.9 F (36.6 C) (01/11 1254) Pulse Rate:  [98-103] 101 (01/11 1254) Resp:  [16-18] 18 (01/11 1254) BP: (151-165)/(79-94) 151/93 (01/11 1254) SpO2:  [96 %-99 %] 99 % (01/11 1254) Last BM Date : 04/07/22   Physical Exam:  General: Pt resting comfortably in no acute distress Wound VAC and dressing in place this was left in place.  Full active range of motion and sensation in the fingers, cap refill intact.  Wound VAC canister with serosanguineous output.   Assessment/Plan: s/p Procedure(s): Preparation of left volar wound with application of myriad APPLICATION OF WOUND VAC   [name] [name] is doing well with regards to the left upper extremity.  We did not remove the dressing, continued distal neurologic function intact.  We will plan for wound VAC change next week.     Stevie Kern Elizette Shek, PA-C 04/10/2022

## 2022-04-10 NOTE — Progress Notes (Addendum)
Speech Language Pathology Daily Session Note  Patient Details  Name: [name] [name] MRN: [id number] Date of Birth: 16-Sep-1980  Today's Date: 04/10/2022 SLP Individual Time: 3220-2542 SLP Individual Time Calculation (min): 20 min  Short Term Goals: Week 1: SLP Short Term Goal 1 (Week 1): Pt will complete additional cognitive-linguistic assessment to  further inform ST POC with 100% completion. SLP Short Term Goal 2 (Week 1): Pt will participate in skilled observation of current diet textures with Sup A for implementation of aspiration precautions. SLP Short Term Goal 3 (Week 1): Pt will demonstrate improved sustained attention to functional task for 10 minutes with Sup A. SLP Short Term Goal 4 (Week 1): Pt will name 2 current physical and 2 cognitive changes s/p hospitalization and how this will impact his stay and d/c with Min A.  Skilled Therapeutic Interventions: Pt seen this date for skilled ST intervention targeting cognitive goals outlined above. Pt received awake/alert and in bed sleeping; aroused to name and then began to yell out in pain. 10/10 pain reported. Attempted to provide distraction and repositioning in bed as pain intervention with no change in participation from pt stating "right now I am in so much pain I can't even think straight." RN notified and provided pt with pain medication. Wheezing noted - RN reports albuterol given earlier in the morning.  Today's session with emphasis on additional cognitive-linguistic assessment. Portions of the COGNISTAT were administered with pt achieving the following scores: attention subtest - 8/8 [facility]), mental math calculation - 3/4 [facility]), verbal abstract reasoning - 4/8 (mild impairment), orientation - 9/12 (borderline impairment), and delayed recall - 8/12 (mild impairment). Pt able to verbalize to SLP how to place pillow and ice pack to mitigate pain in LLE, though was not helpful per pt report. Utilizing call bell appropriately.  At peak performance, pt demonstrated sustained attention for 4-5 minute intervals with Sup A. Declined PO due to pain. No difficulty observed when RN provided medications whole with water. Significantly limited by pain, with pt requesting to end session early due to this despite use of pain interventions outlined above. Therefore, pt missed 25 minutes of skilled ST intervention.   Pt left in bed with all safety measures activated and call bell within reach. Left in low stim environment. Continue per current ST POC.   Pain Pain Assessment Pain Scale: 0-10 Pain Score: 9  Pain Type: Acute pain Pain Location: Leg Pain Orientation: Left Pain Descriptors / Indicators: Aching;Cramping Pain Frequency: Constant Pain Onset: On-going Pain Intervention(s): Medication (See eMAR)  Therapy/Group: Individual Therapy  Easton Sivertson A Taray Normoyle 04/10/2022, 12:54 PM

## 2022-04-11 DIAGNOSIS — I10 Essential (primary) hypertension: Secondary | ICD-10-CM | POA: Diagnosis not present

## 2022-04-11 DIAGNOSIS — N179 Acute kidney failure, unspecified: Secondary | ICD-10-CM | POA: Diagnosis not present

## 2022-04-11 DIAGNOSIS — I639 Cerebral infarction, unspecified: Secondary | ICD-10-CM | POA: Diagnosis not present

## 2022-04-11 DIAGNOSIS — T796XXS Traumatic ischemia of muscle, sequela: Secondary | ICD-10-CM | POA: Diagnosis not present

## 2022-04-11 MED ORDER — PREGABALIN 50 MG PO CAPS
100.0000 mg | ORAL_CAPSULE | Freq: Three times a day (TID) | ORAL | Status: DC
  Administered 2022-04-11 – 2022-04-14 (×9): 100 mg via ORAL
  Filled 2022-04-11 (×9): qty 2

## 2022-04-11 MED ORDER — OXYCODONE HCL 5 MG PO TABS
10.0000 mg | ORAL_TABLET | ORAL | Status: DC | PRN
  Administered 2022-04-11 – 2022-04-21 (×54): 15 mg via ORAL
  Filled 2022-04-11 (×22): qty 3
  Filled 2022-04-11: qty 2
  Filled 2022-04-11 (×32): qty 3

## 2022-04-11 MED ORDER — PREDNISONE 20 MG PO TABS
60.0000 mg | ORAL_TABLET | Freq: Every day | ORAL | Status: AC
  Administered 2022-04-12: 60 mg via ORAL
  Filled 2022-04-11: qty 3

## 2022-04-11 MED ORDER — PREDNISONE 20 MG PO TABS
60.0000 mg | ORAL_TABLET | Freq: Once | ORAL | Status: AC
  Administered 2022-04-11: 60 mg via ORAL
  Filled 2022-04-11: qty 3

## 2022-04-11 MED ORDER — PREDNISONE 20 MG PO TABS
40.0000 mg | ORAL_TABLET | Freq: Every day | ORAL | Status: DC
  Administered 2022-04-13 – 2022-04-14 (×2): 40 mg via ORAL
  Filled 2022-04-11 (×2): qty 2

## 2022-04-11 NOTE — Progress Notes (Signed)
PROGRESS NOTE   Subjective/Complaints: Pain ongoing LLE. Slept off and on. Hasn't noticed a difference with steroid so far.   ROS: Patient denies fever, rash, sore throat, blurred vision, dizziness, nausea, vomiting, diarrhea, cough, shortness of breath or chest pain,   headache, or mood change.    Objective:   No results found. Recent Labs    04/09/22 0537  WBC 3.6*  HGB 7.1*  HCT 22.7*  PLT 178   Recent Labs    04/09/22 0537  NA 138  K 3.7  CL 105  CO2 23  GLUCOSE 97  BUN 43*  CREATININE 2.79*  CALCIUM 8.0*    Intake/Output Summary (Last 24 hours) at 04/11/2022 1225 Last data filed at 04/11/2022 0839 Gross per 24 hour  Intake 1073 ml  Output 3275 ml  Net -2202 ml        Physical Exam: Vital Signs Blood pressure (!) 141/94, pulse 93, temperature 97.8 F (36.6 C), resp. rate 18, height 5\' 7"  (1.702 m), weight 98.5 kg, SpO2 99 %.  Constitutional: No distress . Vital signs reviewed. HEENT: NCAT, EOMI, oral membranes moist Neck: supple Cardiovascular: RRR without murmur. No JVD    Respiratory/Chest: CTA Bilaterally without wheezes or rales. Normal effort    GI/Abdomen: BS +, non-tender, non-distended Ext: no clubbing, cyanosis, or edema Psych: pleasant and cooperative  Skin: numerous tats. Left arm dressed. A few abrasions left leg. Neuro: Alert and oriented x 3. Normal insight and awareness. Intact Memory. Normal language and speech. Cranial nerve exam unremarkable  Moves all 4's. Unable to wiggle toes, adf/apf LLE. Wiggles fingers Left hand Musculoskeletal: left arm splinted. Left leg tender proximally from upper third of thigh with the most pain distally at foot.      Assessment/Plan: 1. Functional deficits which require 3+ hours per day of interdisciplinary therapy in a comprehensive inpatient rehab setting. Physiatrist is providing close team supervision and 24 hour management of active medical  problems listed below. Physiatrist and rehab team continue to assess barriers to discharge/monitor patient progress toward functional and medical goals  Care Tool:  Bathing    Body parts bathed by patient: Left arm, Chest, Abdomen, Front perineal area, Buttocks, Right upper leg, Left upper leg   Body parts bathed by helper: Right arm, Right lower leg, Left lower leg     Bathing assist Assist Level: Moderate Assistance - Patient 50 - 74%     Upper Body Dressing/Undressing Upper body dressing   What is the patient wearing?: Pull over shirt    Upper body assist Assist Level: Maximal Assistance - Patient 25 - 49%    Lower Body Dressing/Undressing Lower body dressing      What is the patient wearing?: Pants     Lower body assist Assist for lower body dressing: Total Assistance - Patient < 25%     Toileting Toileting    Toileting assist Assist for toileting: Maximal Assistance - Patient 25 - 49%     Transfers Chair/bed transfer  Transfers assist     Chair/bed transfer assist level: Moderate Assistance - Patient 50 - 74%     Locomotion Ambulation   Ambulation assist  Assist level: Moderate Assistance - Patient 50 - 74% Assistive device: Parallel bars Max distance: 3   Walk 10 feet activity   Assist  Walk 10 feet activity did not occur: Safety/medical concerns        Walk 50 feet activity   Assist Walk 50 feet with 2 turns activity did not occur: Safety/medical concerns         Walk 150 feet activity   Assist Walk 150 feet activity did not occur: Safety/medical concerns         Walk 10 feet on uneven surface  activity   Assist Walk 10 feet on uneven surfaces activity did not occur: Safety/medical concerns         Wheelchair     Assist Is the patient using a wheelchair?: Yes Type of Wheelchair: Manual    Wheelchair assist level: Minimal Assistance - Patient > 75% Max wheelchair distance: 60    Wheelchair 50 feet  with 2 turns activity    Assist        Assist Level: Minimal Assistance - Patient > 75%   Wheelchair 150 feet activity     Assist      Assist Level: Maximal Assistance - Patient 25 - 49%   Blood pressure (!) 141/94, pulse 93, temperature 97.8 F (36.6 C), resp. rate 18, height 5\' 7"  (1.702 m), weight 98.5 kg, SpO2 99 %.  Medical Problem List and Plan: 1. Functional deficits secondary to bilateral acute/subacute cerebellar infarcts; left forearm compartment syndrome             -patient may shower if left arm covered             -ELOS/Goals: 12-14 days, mod I to supervision goals             -will hold off on PRAFO LLE for now due pain/sensitivity  -Continue CIR therapies including PT, OT   2.  Antithrombotics: -DVT/anticoagulation:  Pharmaceutical: Heparin             -antiplatelet therapy: aspirin 81 mg daily   3. Pain Management: Tylenol, Flexeril, Norco as needed -  Lidoderm             -  scheduled dilaudid  decreased to 2mg  1/10--continue with same amount  -1/12- have increased nortriptyline to 25mg  qhs -adjust lyrica to 100mg  tid   -continue trial of cymbalta 20mg  daily   -changed norco to oxycodone for breakthrough, 10-15mg  q4 prn   -prednisone burst starting at 60mg  daily 1/11 for 3 days, then 40mg  daily-->gradual taper 4. Mood/Behavior/Sleep: LCSW to evaluate and provide emotional support             -antipsychotic agents: n/a   5. Neuropsych/cognition: This patient is capable of making decisions on  his own behalf.             -SLP eval   6. Skin/Wound Care: Routine skin care checks             -left forearm woundVAC per plastic surgery   7. Fluids/Electrolytes/Nutrition: Strict Is and Os and follow-up chemistries             -renal diet with fluid restriction             -hypoalbuminemia: continue protein supplements             -continue thiamine, folate supplementation   8: Polysubstance abuse: cessation counseling   9: Left forearm compartment  syndrome status post fasciotomies 12/21 Dr. Fredna Dow             -  wound management per plastic surgery. Myriad sheet placement yesterday             -carpal tunnel release left hand also has been performed             -continue ROM exercises, splint, limit weight bearing to 10 lbs.   10: Acute kidney injury: nephrology following; follow BMP   1/10 Cr falling nicely. Down to 2.79   1/11 RIJ removed yesterday 11: Rhabdomyolysis: CK down to 906   12: Shock liver/elevated LFTs,     -WNL 1/10 13: Left gluteal myositis, ongoing pain: Continue AFO for foot drop             -outpatient NCV/EMG to assess for potential sciatic nv injury             -ROM without restrictions   -see pain mgt above 14: Hyperphosphatemia   15: Anemia,acute blood loss/hemodilution/HD, s/p 1 unit PCs: follow-up CBC   -1/10 hgb down to 7.1--no gross blood loss  -f/u Monday 16: Hypertension: monitor TID and prn             -continue Norvasc 5 mg daily             -hydralazine 25 mg TID   -1/10 bp controlled. Large Pain and anxiety component  LOS: 3 days A FACE TO FACE EVALUATION WAS PERFORMED  Meredith Staggers 04/11/2022, 12:25 PM

## 2022-04-11 NOTE — Progress Notes (Signed)
Patient C/O generalized body pain. Requests PRN pain medications every 4 hours. Upon recheck patient rates pain 4-6. Encouraged ice, elevation and repositioning. Patient states that he needs a stool softener. PRN Sorbitol and Miralax given per patients request. No results, will inform [place] nurse. Encouraged fluids.

## 2022-04-11 NOTE — Progress Notes (Signed)
Physical Therapy Session Note  Patient Details  Name: [name] [name] MRN: [id number] Date of Birth: 06-May-1980  Today's Date: 04/11/2022 PT Individual Time: 0901-1015 PT Individual Time Calculation (min): 74 min   Short Term Goals: Week 1:  PT Short Term Goal 1 (Week 1): Pt will perform bed mobility with CGA PT Short Term Goal 2 (Week 1): Pt will transfer to [facility] with CGA PT Short Term Goal 3 (Week 1): Pt will ambulate 110ft with min assist and LRAD PT Short Term Goal 4 (Week 1): Pt will propell WC >132ft with supervision assist using hemi technique  Skilled Therapeutic Interventions/Progress Updates:   First session:Pt handed off from OT sitting in w/c w/ increased pain especially to L LE into foot.  Retrieved elevating L leg rest for w/c and positioned for maximal comfort.  Pt performed sit to stand and SPT w/ mod A w/c to lower height w/c.  Massage for edema management and desensitization to L LE beginning in foot and extending to knee.  Pt tolerated fair w/ c/o pain, but decreasing sensitivity and relaxation w/ decreased pain noted.  Pt negotiated w/c w/ min A x 60' using R extremities, UE > LE.  A shoe would help reach the ground w/ RLE.  Pt performed sit to stand under L arm and SPT w/c > bed w/ mod A.  Pt transferred sit to supine w/ supervision and positioned leg on pillow for comfort, flexed and ER.  Bed alarm on and all needs in reach.  Second session:  Pt presents supine in bed in frog leg position LLE.  Pt c/o 10/10 pain consistently reaching to L inner thigh to massage, stating "that's as far as I can reach."  Pt agreeable to perform LE there ex.  Pt performs hip/knee flex/ext w/ increased time for LLE 2/2 burning pain down through the foot.  Pt performed 3 x 10-15 w/ rest breaks for pain subsiding.  Pt performed UE flexion 2 x 10.  Pt c/o nausea, given emesis bag.  Pt tolerated massage to L foot/leg w/ good tolerance and relief.  Pt unable to continue 2/2 pain.  Missed time 20  min.     Therapy Documentation Precautions:  Precautions Precautions: Fall Precaution Comments: L forearm in dressing from fasciotomies and closure; TDC R IJ 1/2; 10 # weight bearing limitation per Dr Fredna Dow 1/3, no ROM restrictions Restrictions Weight Bearing Restrictions: Yes LUE Weight Bearing: Partial weight bearing LLE Weight Bearing: Partial weight bearing LLE Partial Weight Bearing Percentage or Pounds: no Other Position/Activity Restrictions: limit to 10# through LUE General:   Vital Signs:  Pain:10/10      Therapy/Group: Individual Therapy  [name] 04/11/2022, 10:44 AM

## 2022-04-11 NOTE — Progress Notes (Signed)
Occupational Therapy Session Note  Patient Details  Name: [name] [name] MRN: [id number] Date of Birth: [birth year]  Today's Date: 04/11/2022 OT Individual Time: 0820-0900 OT Individual Time Calculation (min): 40 min   Short Term Goals: Week 1:  OT Short Term Goal 1 (Week 1): Pt will transfer to toilet wiht LRAD and CGA OT Short Term Goal 2 (Week 1): Pt will compelte 2/3 steps of LB dressing OT Short Term Goal 3 (Week 1): Pt will don shirt wiht MIN A OT Short Term Goal 4 (Week 1): Pt will recall hemi dressing techniqes wiht MIN question cues  Skilled Therapeutic Interventions/Progress Updates:   Pt greeted semi-reclined in bed reporting high pain in R LE and R hip. Pt completed bed level LB bathing with mod A. Worked on reaching back with R UE to cleanse buttocks, but had difficulty internally rotating R UE to cleanse thoroughly. Pt could thread pants at bed level with min A, then bridging to pull then up with min A and encouregament to weight bear through L LE. Pt came to sitting EOB using bed rails and supervision. Pt tolerated sitting EOB for UB bathing/dressing. Pt could use R UE to wash under L arm, cheat, face, but needed OT assist to wash under R arm. Assisted with threading R UE through shirt sleeve and wound vac, then OT assist to get shirt overhead. Pt completed stand with Min A from OT and then pivoted to wc. Pt handoff to PT for next therapy session.   Therapy Documentation Precautions:  Precautions Precautions: Fall Precaution Comments: L forearm in dressing from fasciotomies and closure; TDC R IJ 1/2; 10 # weight bearing limitation per Dr Fredna Dow 1/3, no ROM restrictions Restrictions Weight Bearing Restrictions: Yes LUE Weight Bearing: Partial weight bearing LLE Weight Bearing: Partial weight bearing LLE Partial Weight Bearing Percentage or Pounds: no Other Position/Activity Restrictions: limit to 10# through LUE Pain: 10/10 pain in LLE. Rest and repositioned,  nursing administered pain meds.   Therapy/Group: Individual Therapy  Valma Cava 04/11/2022, 9:08 AM

## 2022-04-11 NOTE — IPOC Note (Signed)
Overall Plan of Care [facility]) Patient Details Name: [name] [name] MRN: [id number] DOB: 16-Dec-1980  Admitting Diagnosis: Acute ischemic stroke [facility]), rhabdomyolysis with left sciatic nerve injury  Hospital Problems: Principal Problem:   Acute ischemic stroke [facility] [address])     Functional Problem List: Nursing Pain, Safety, Endurance, Medication Management, Skin Integrity, Bowel  PT Balance, Behavior, Edema, Endurance, Motor, Pain, Perception, Safety, Sensory, Skin Integrity  OT Balance, Cognition, Endurance, Pain, Motor, Safety  SLP    TR         Basic ADL's: OT Grooming, Bathing, Dressing, Toileting     Advanced  ADL's: OT       Transfers: PT Bed Mobility, Bed to Chair, Car, [employer], Futures trader, Metallurgist: PT Ambulation, Emergency planning/management officer, Stairs     Additional Impairments: OT Fuctional Use of Upper Extremity  SLP Social Cognition   Problem Solving, Memory, Attention, Awareness  TR      Anticipated Outcomes Item Anticipated Outcome  Self Feeding S  Swallowing  Sup A   Basic self-care  S  Toileting  S   Bathroom Transfers S  Bowel/Bladder  manage bowel w mod I assist  Transfers  supervsion assist  Locomotion  Ambulatory short distances with LRAD and superivsion assist. supervision assist WC mobility  Communication  N/A  Cognition  Sup A  Pain  < 4 with prns  Safety/Judgment  manage w cues   Therapy Plan: PT Intensity: Minimum of 1-2 x/day ,45 to 90 minutes PT Frequency: 5 out of 7 days PT Duration Estimated Length of Stay: 2-2.5 weeks OT Intensity: Minimum of 1-2 x/day, 45 to 90 minutes OT Frequency: 5 out of 7 days OT Duration/Estimated Length of Stay: 2 weeks SLP Intensity: Minumum of 1-2 x/day, 30 to 90 minutes SLP Frequency: 3 to 5 out of 7 days SLP Duration/Estimated Length of Stay: 2 weeks   Team Interventions: Nursing Interventions Patient/Family Education, Pain Management, Medication Management, Discharge  Planning, Bowel Management, Skin Care/Wound Management, Disease Management/Prevention  PT interventions Ambulation/gait training, Balance/vestibular training, Cognitive remediation/compensation, Community reintegration, Discharge planning, Disease management/prevention, DME/adaptive equipment instruction, Functional electrical stimulation, Functional mobility training, Patient/family education, Pain management, Neuromuscular re-education, Psychosocial support, Skin care/wound management, Splinting/orthotics, Therapeutic Exercise, Therapeutic Activities, Stair training, UE/LE Strength taining/ROM, UE/LE Coordination activities, Visual/perceptual remediation/compensation, Wheelchair propulsion/positioning  OT Interventions Balance/vestibular training, Discharge planning, Functional electrical stimulation, Pain management, Self Care/advanced ADL retraining, Therapeutic Activities, UE/LE Coordination activities, Visual/perceptual remediation/compensation, Therapeutic Exercise, Skin care/wound managment, Patient/family education, Functional mobility training, Disease mangement/prevention, Cognitive remediation/compensation, Community reintegration, Engineer, drilling, Neuromuscular re-education, Psychosocial support, Splinting/orthotics, UE/LE Strength taining/ROM, Wheelchair propulsion/positioning  SLP Interventions Dysphagia/aspiration precaution training, Internal/external aids  TR Interventions    SW/CM Interventions Discharge Planning, Psychosocial Support, Patient/Family Education   Barriers to Discharge MD  Medical stability and pain  Nursing Decreased caregiver support, Home environment access/layout 2 level 3 ste, B+B on main w sister  PT Inaccessible home environment, Decreased caregiver support, Home environment access/layout, Wound Care, Lack of/limited family support, Insurance for SNF coverage, Hemodialysis, Pending surgery, Behavior    OT Decreased caregiver support, Weight  bearing restrictions, Home environment access/layout, Lack of/limited family support    SLP      SW Decreased caregiver support, Lack of/limited family support, Insurance underwriter for SNF coverage, Other (comments) wound care needs- wound vac   Team Discharge Planning: Destination: PT-Home ,OT- Home , SLP-Home Projected Follow-up: PT-Home health PT, OT-  Outpatient OT, SLP-Outpatient SLP, Home Health SLP, 24 hour supervision/assistance Projected  Equipment Needs: PT-Wheelchair cushion (measurements), Wheelchair (measurements), Other (comment), OT- 3 in 1 bedside comode, Tub/shower bench, To be determined, SLP-None recommended by SLP Equipment Details: PT-HW, OT-  Patient/family involved in discharge planning: PT- Patient,  OT-Patient, SLP-Patient, Family member/caregiver  MD ELOS: 15-20 days Medical Rehab Prognosis:  Good Assessment: The patient has been admitted for CIR therapies with the diagnosis of CVA complicated by rhabdomyolysis and left sciatic nerve injury. The team will be addressing functional mobility, strength, stamina, balance, safety, adaptive techniques and equipment, self-care, bowel and bladder mgt, patient and caregiver education, pain mgt, community reentry. Goals have been set at supervision for self-care and mobility. Anticipated discharge destination is home.        See Team Conference Notes for weekly updates to the plan of care

## 2022-04-12 DIAGNOSIS — I639 Cerebral infarction, unspecified: Secondary | ICD-10-CM | POA: Diagnosis not present

## 2022-04-12 MED ORDER — POLYETHYLENE GLYCOL 3350 17 G PO PACK
17.0000 g | PACK | Freq: Two times a day (BID) | ORAL | Status: DC
  Administered 2022-04-12 – 2022-04-20 (×14): 17 g via ORAL
  Filled 2022-04-12 (×19): qty 1

## 2022-04-12 NOTE — Progress Notes (Signed)
PROGRESS NOTE   Subjective/Complaints:  C/o constipation has been on escalating pain meds  Foot pain ok unless pt moves the left lower ext   ROS: Patient denies N/V/D , no abd pain , no wheezing or breathing problems  Objective:   No results found. No results for input(s): "WBC", "HGB", "HCT", "PLT" in the last 72 hours.  No results for input(s): "NA", "K", "CL", "CO2", "GLUCOSE", "BUN", "CREATININE", "CALCIUM" in the last 72 hours.   Intake/Output Summary (Last 24 hours) at 04/12/2022 1054 Last data filed at 04/12/2022 0900 Gross per 24 hour  Intake 838 ml  Output 3200 ml  Net -2362 ml         Physical Exam: Vital Signs Blood pressure 117/81, pulse 83, temperature 98.1 F (36.7 C), resp. rate 18, height 5\' 7"  (1.702 m), weight 98.5 kg, SpO2 99 %.   General: No acute distress Mood and affect are appropriate Heart: Regular rate and rhythm no rubs murmurs or extra sounds Lungs: Clear to auscultation, breathing unlabored, no rales or wheezes Abdomen: Positive bowel sounds, soft nontender to palpation, nondistended Extremities: No clubbing, cyanosis, or edema Skin: No evidence of breakdown, no evidence of rash Neurologic: Cranial nerves II through XII intact, motor strength is normal RUE and RLE LUE not tested due to long arm spling LLE 3/5 Knee ext , 0/5 Left foot Hyman Hopes DF/PF   Neuro: Alert and oriented x 3. Normal insight and awareness. Intact Memory. Normal language and speech. Cranial nerve exam unremarkable  Moves all 4's. Unable to wiggle toes, adf/apf LLE. Wiggles fingers Left hand Musculoskeletal: left arm splinted. Left leg tender proximally from upper third of thigh with the most pain distally at foot.      Assessment/Plan: 1. Functional deficits which require 3+ hours per day of interdisciplinary therapy in a comprehensive inpatient rehab setting. Physiatrist is providing close team supervision and 24  hour management of active medical problems listed below. Physiatrist and rehab team continue to assess barriers to discharge/monitor patient progress toward functional and medical goals  Care Tool:  Bathing    Body parts bathed by patient: Left arm, Chest, Abdomen, Front perineal area, Buttocks, Right upper leg, Left upper leg   Body parts bathed by helper: Right arm, Right lower leg, Left lower leg     Bathing assist Assist Level: Moderate Assistance - Patient 50 - 74%     Upper Body Dressing/Undressing Upper body dressing   What is the patient wearing?: Pull over shirt    Upper body assist Assist Level: Maximal Assistance - Patient 25 - 49%    Lower Body Dressing/Undressing Lower body dressing      What is the patient wearing?: Pants     Lower body assist Assist for lower body dressing: Total Assistance - Patient < 25%     Toileting Toileting    Toileting assist Assist for toileting: Maximal Assistance - Patient 25 - 49%     Transfers Chair/bed transfer  Transfers assist     Chair/bed transfer assist level: Moderate Assistance - Patient 50 - 74%     Locomotion Ambulation   Ambulation assist      Assist level: Moderate Assistance -  Patient 50 - 74% Assistive device: Parallel bars Max distance: 3   Walk 10 feet activity   Assist  Walk 10 feet activity did not occur: Safety/medical concerns        Walk 50 feet activity   Assist Walk 50 feet with 2 turns activity did not occur: Safety/medical concerns         Walk 150 feet activity   Assist Walk 150 feet activity did not occur: Safety/medical concerns         Walk 10 feet on uneven surface  activity   Assist Walk 10 feet on uneven surfaces activity did not occur: Safety/medical concerns         Wheelchair     Assist Is the patient using a wheelchair?: Yes Type of Wheelchair: Manual    Wheelchair assist level: Minimal Assistance - Patient > 75% Max wheelchair  distance: 60    Wheelchair 50 feet with 2 turns activity    Assist        Assist Level: Minimal Assistance - Patient > 75%   Wheelchair 150 feet activity     Assist      Assist Level: Maximal Assistance - Patient 25 - 49%   Blood pressure 117/81, pulse 83, temperature 98.1 F (36.7 C), resp. rate 18, height 5\' 7"  (1.702 m), weight 98.5 kg, SpO2 99 %.  Medical Problem List and Plan: 1. Functional deficits secondary to bilateral acute/subacute cerebellar infarcts; left forearm compartment syndrome             -patient may shower if left arm covered             -ELOS/Goals: 12-14 days, mod I to supervision goals             -will hold off on PRAFO LLE for now due pain/sensitivity  -Continue CIR therapies including PT, OT   2.  Antithrombotics: -DVT/anticoagulation:  Pharmaceutical: Heparin             -antiplatelet therapy: aspirin 81 mg daily   3. 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 Mood/Behavior/Sleep: LCSW to evaluate and provide emotional support             -antipsychotic agents: n/a   5. Neuropsych/cognition: This patient is capable of making decisions on  his own behalf.             -SLP eval   6. Skin/Wound Care: Routine skin care checks             -left forearm woundVAC per plastic surgery   7. Fluids/Electrolytes/Nutrition: Strict Is and Os and follow-up chemistries             -renal diet with fluid restriction             -hypoalbuminemia: continue protein supplements              -continue thiamine, folate supplementation   8: Polysubstance abuse: cessation counseling   9: Left forearm compartment syndrome  status post fasciotomies 12/21 Dr. Fredna Dow             -wound management per plastic surgery. Myriad sheet placement yesterday             -carpal tunnel release left hand also has been performed             -continue ROM exercises, splint, limit weight bearing to 10 lbs.   10: Acute kidney injury: nephrology following; follow BMP   1/10 Cr falling nicely. Down to 2.79   1/11 RIJ removed yesterday 11: Rhabdomyolysis: CK down to 906   12: Shock liver/elevated LFTs,     -WNL 1/10 13: Left gluteal myositis, ongoing pain: Continue AFO for foot drop             -outpatient NCV/EMG to assess for potential sciatic nv injury             -ROM without restrictions   -see pain mgt above 14: Hyperphosphatemia   15: Anemia,acute blood loss/hemodilution/HD, s/p 1 unit PCs: follow-up CBC   -1/10 hgb down to 7.1--no gross blood loss  -f/u Monday 16: Hypertension: monitor TID and prn             -continue Norvasc 5 mg daily             -hydralazine 25 mg TID    Vitals:   04/11/22 1937 04/12/22 0301  BP: (!) 179/96 117/81  Pulse: (!) 101 83  Resp: 16 18  Temp: 98.7 F (37.1 C) 98.1 F (36.7 C)  SpO2: 99% 99%   Labile improved this am 1/13 17.  Opioid induced constipation schedule Miralax BID LOS: 4 days A FACE TO FACE EVALUATION WAS PERFORMED  [name] [name] 04/12/2022, 10:54 AM

## 2022-04-12 NOTE — Progress Notes (Signed)
Patient continues to request PRN Oxy 15 mg every 4 hours. Patient rotates between his Flexeril 10 mg and Robaxin  500 mg. Patient states he feels better today even though he still has pain. Educated patient on fluid restriction. Administered Sorbitol PRN this AM. Patient still has not had a BM. ABD soft, active and non tender.

## 2022-04-12 NOTE — Progress Notes (Signed)
Physical Therapy Session Note  Patient Details  Name: [name] [name] MRN: [id number] Date of Birth: Nov 25, 1980  Today's Date: 04/12/2022 PT Individual Time: 1435-1530 PT Individual Time Calculation (min): 55 min   Short Term Goals: Week 1:  PT Short Term Goal 1 (Week 1): Pt will perform bed mobility with CGA PT Short Term Goal 2 (Week 1): Pt will transfer to [facility] with CGA PT Short Term Goal 3 (Week 1): Pt will ambulate 2ft with min assist and LRAD PT Short Term Goal 4 (Week 1): Pt will propell WC >154ft with supervision assist using hemi technique  Skilled Therapeutic Interventions/Progress Updates: Pt presents supine in bed and ready for therapy.  Pt states pain of 8/10 LLE/foot but in better spirits from yesterday.  Pt transfers sup to sit using siderail and supervision, verbal cues for breathing.  Pt transfers sit to stand and SPT bed > w/c w/ mod A and PT helping underneath L arm.  Pt transfers w/ R foot only, L knee flexed up 2/2 pain.  Pt wheeled w/c down hallway using R UE only w/ min A from PT holding wound vac.Pt performed multiple sit to stand in // bars w/ mod A.  Pt able to slowly place L foot on ground for advancement of RLE.  Toe-up brace donned to L foot w/ improved step length R.  Pt stepped forward and back  5' forward and back x 3 trials.  Seated rest breaks required 2/2 pain.  Pt wheeled back to room w/ min A.  Pt sit to stand and SPT w/c > bed w/ mod A, sit to supine w/ supervision.  L arm positioned on pillows for elevation.  All needs in reach.     Therapy Documentation Precautions:  Precautions Precautions: Fall Precaution Comments: L forearm in dressing from fasciotomies and closure; TDC R IJ 1/2; 10 # weight bearing limitation per Dr Fredna Dow 1/3, no ROM restrictions Restrictions Weight Bearing Restrictions: Yes LUE Weight Bearing: Partial weight bearing LLE Weight Bearing: Partial weight bearing LLE Partial Weight Bearing Percentage or Pounds: no Other  Position/Activity Restrictions: limit to 10# through LUE General:   Vital Signs: Therapy Vitals Temp: 97.8 F (36.6 C) Temp Source: Oral Pulse Rate: 98 Resp: 16 BP: (!) 141/92 Patient Position (if appropriate): Lying Oxygen Therapy SpO2: 99 % O2 Device: Room Air Pain:8/10 w/o activity.       Therapy/Group: Individual Therapy  [name] 04/12/2022, 3:34 PM

## 2022-04-12 NOTE — Progress Notes (Addendum)
Speech Language Pathology Daily Session Note  Patient Details  Name: [name] [name] MRN: [id number] Date of Birth: [birth year]  Today's Date: 04/12/2022 SLP Individual Time: 2482-5003 SLP Individual Time Calculation (min): 60 min  Short Term Goals: Week 1: SLP Short Term Goal 1 (Week 1): Pt will complete additional cognitive-linguistic assessment to  further inform ST POC with 100% completion. SLP Short Term Goal 2 (Week 1): Pt will participate in skilled observation of current diet textures with Sup [name] [name] for implementation of aspiration precautions. SLP Short Term Goal 3 (Week 1): Pt will demonstrate improved sustained attention to functional task for 10 minutes with Sup [name] [name]. SLP Short Term Goal 4 (Week 1): Pt will name 2 current physical and 2 cognitive changes s/p hospitalization and how this will impact his stay and d/c with Min [name] [name].  Skilled Therapeutic Interventions: Skilled intervention focused on cognition. Pt completed mental time calculations with functional time problem solving questions related to daily schedule with min [name] [name]. He sustained attention during working memory task with verbal information with mod [name] [name]. He sustained attention for mini sudoku's for 10 min with supervision for redirection and attention to details. Pt had complaints of leg pain during session. RN notified. Cont with therapy per plan of care.      Pain Pain Assessment Pain Scale: Faces Pain Score: 3  Faces Pain Scale: Hurts whole lot Pain Location: Leg Pain Orientation: Left Pain Onset: On-going  Therapy/Group: Individual Therapy  [name] [name] [name] [name] [name] [name] 04/12/2022, 11:14 AM

## 2022-04-12 NOTE — Progress Notes (Signed)
Occupational Therapy Session Note  Patient Details  Name: [name] [name] MRN: [id number] Date of Birth: [birth year]  Today's Date: 04/12/2022 OT Individual Time: 2549-8264 OT Individual Time Calculation (min): 71 min    Short Term Goals: Week 1:  OT Short Term Goal 1 (Week 1): Pt will transfer to toilet wiht LRAD and CGA OT Short Term Goal 2 (Week 1): Pt will compelte 2/3 steps of LB dressing OT Short Term Goal 3 (Week 1): Pt will don shirt wiht MIN A OT Short Term Goal 4 (Week 1): Pt will recall hemi dressing techniqes wiht MIN question cues  Skilled Therapeutic Interventions/Progress Updates:     Pt received in w/c with unrated, premedicated pain in LLE Rest and reposition provided for pain relief  ADL: Pt completes ADL at overall min A level for LB using bridging at hips/rolling to manage pants past hips, MIN A to don shirt to avoid pulling out IV and thread wound vac. Grooming at sink with s/u seated in w/c  Therapeutic exercise Supine LLE therex 2x each exercise for LB strengthening and tolerance ot activity.   SLR Heel slides Ab/adduciton Ankle pumps   BLE therex for tolerance for pressure on LLE 1x5 glute bridges with pillow under LLE  Therapeutic activity Seated EOB with LLE on bumpy disc Pt completes bean bag toss for functional reach of RUE as well as input into LLE into bumpy disc for claf stretch/desensitization with supervision A overall. Activity performed to improve dynamic balance and functional reach in min-mod ranges outside BOS in prep for BADLs/IADLs. Pt continues to demo impaired flexion of R deltoid, compensating with abduction and shoulder hike for functional reach.   Pt left at end of session in bed with exit alarm on, call light in reach and all needs met   Therapy Documentation Precautions:  Precautions Precautions: Fall Precaution Comments: L forearm in dressing from fasciotomies and closure; TDC R IJ 1/2; 10 # weight bearing limitation  per Dr Fredna Dow 1/3, no ROM restrictions Restrictions Weight Bearing Restrictions: Yes LUE Weight Bearing: Partial weight bearing LLE Weight Bearing: Partial weight bearing LLE Partial Weight Bearing Percentage or Pounds: no Other Position/Activity Restrictions: limit to 10# through LUE General:    Therapy/Group: Individual Therapy  [name] 04/12/2022, 6:25 AM

## 2022-04-13 DIAGNOSIS — I639 Cerebral infarction, unspecified: Secondary | ICD-10-CM | POA: Diagnosis not present

## 2022-04-13 MED ORDER — MAGNESIUM CITRATE PO SOLN
1.0000 | Freq: Once | ORAL | Status: AC
  Administered 2022-04-13: 1 via ORAL
  Filled 2022-04-13: qty 296

## 2022-04-13 NOTE — Progress Notes (Signed)
PROGRESS NOTE   Subjective/Complaints:  NO BM- last was 1/10- s/p Sorbitol yesterday with no results.   Asking can update renal diet- will do so, since not on HD anymore and Cr down dramatically.  However, will con't IVFs since last Cr 2.7  Having spasms- has flexeril- is helpful But still "horrible"- leg still hurting severely per pt-  Changed back to Oxycodone, from [place].     ROS:   Pt denies SOB, abd pain, CP, N/V (+/)C/D, and vision changes Except for HPI Objective:   No results found. No results for input(s): "WBC", "HGB", "HCT", "PLT" in the last 72 hours.  No results for input(s): "NA", "K", "CL", "CO2", "GLUCOSE", "BUN", "CREATININE", "CALCIUM" in the last 72 hours.   Intake/Output Summary (Last 24 hours) at 04/13/2022 1810 Last data filed at 04/13/2022 1700 Gross per 24 hour  Intake 952 ml  Output 3510 ml  Net -2558 ml        Physical Exam: Vital Signs Blood pressure (!) 149/88, pulse 98, temperature 98.3 F (36.8 C), temperature source Oral, resp. rate 16, height 5\' 7"  (1.702 m), weight 98.5 kg, SpO2 100 %.    General: awake, alert, appropriate, supine in bed; almost writhing due to pain; NAD HENT: conjugate gaze; oropharynx moist; tattoos on face CV: borderline tachycardic rate; no JVD Pulmonary: CTA B/L; no W/R/R- good air movement GI: soft, NT, but distended somewhat and hypoactive BS Psychiatric: appropriate- irritable Neurological: Ox3  Neurologic: Cranial nerves II through XII intact, motor strength is normal RUE and RLE LUE not tested due to long arm spling LLE 3/5 Knee ext , 0/5 Left foot Hyman Hopes DF/PF   Neuro: Alert and oriented x 3. Normal insight and awareness. Intact Memory. Normal language and speech. Cranial nerve exam unremarkable  Moves all 4's. Unable to wiggle toes, adf/apf LLE. Wiggles fingers Left hand Musculoskeletal: left arm splinted. Left leg tender proximally from  upper third of thigh with the most pain distally at foot.      Assessment/Plan: 1. Functional deficits which require 3+ hours per day of interdisciplinary therapy in a comprehensive inpatient rehab setting. Physiatrist is providing close team supervision and 24 hour management of active medical problems listed below. Physiatrist and rehab team continue to assess barriers to discharge/monitor patient progress toward functional and medical goals  Care Tool:  Bathing    Body parts bathed by patient: Left arm, Chest, Abdomen, Front perineal area, Buttocks, Right upper leg, Left upper leg   Body parts bathed by helper: Right arm, Right lower leg, Left lower leg     Bathing assist Assist Level: Moderate Assistance - Patient 50 - 74%     Upper Body Dressing/Undressing Upper body dressing   What is the patient wearing?: Pull over shirt    Upper body assist Assist Level: Maximal Assistance - Patient 25 - 49%    Lower Body Dressing/Undressing Lower body dressing      What is the patient wearing?: Pants     Lower body assist Assist for lower body dressing: Total Assistance - Patient < 25%     Toileting Toileting    Toileting assist Assist for toileting: Maximal Assistance - Patient 25 -  49%     Transfers Chair/bed transfer  Transfers assist     Chair/bed transfer assist level: Moderate Assistance - Patient 50 - 74%     Locomotion Ambulation   Ambulation assist      Assist level: Moderate Assistance - Patient 50 - 74% Assistive device: Parallel bars Max distance: 5   Walk 10 feet activity   Assist  Walk 10 feet activity did not occur: Safety/medical concerns        Walk 50 feet activity   Assist Walk 50 feet with 2 turns activity did not occur: Safety/medical concerns         Walk 150 feet activity   Assist Walk 150 feet activity did not occur: Safety/medical concerns         Walk 10 feet on uneven surface  activity   Assist Walk 10  feet on uneven surfaces activity did not occur: Safety/medical concerns         Wheelchair     Assist Is the patient using a wheelchair?: Yes Type of Wheelchair: Manual    Wheelchair assist level: Minimal Assistance - Patient > 75% Max wheelchair distance: 100    Wheelchair 50 feet with 2 turns activity    Assist        Assist Level: Minimal Assistance - Patient > 75%   Wheelchair 150 feet activity     Assist      Assist Level: Moderate Assistance - Patient 50 - 74%   Blood pressure (!) 149/88, pulse 98, temperature 98.3 F (36.8 C), temperature source Oral, resp. rate 16, height 5\' 7"  (1.702 m), weight 98.5 kg, SpO2 100 %.  Medical Problem List and Plan: 1. Functional deficits secondary to bilateral acute/subacute cerebellar infarcts; left forearm compartment syndrome             -patient may shower if left arm covered             -ELOS/Goals: 12-14 days, mod I to supervision goals             -will hold off on PRAFO LLE for now due pain/sensitivity  Con't CIR- PT and OT  2.  Antithrombotics: -DVT/anticoagulation:  Pharmaceutical: Heparin             -antiplatelet therapy: aspirin 81 mg daily   3. Pain Management: Tylenol, Flexeril, Norco as needed -  Lidoderm Left gluteal myositis with sciatic nerve compression Left foot drop and hypersensitivity             -  scheduled dilaudid  decreased to 2mg  1/10--continue with same amount  -1/12- have increased nortriptyline to 25mg  qhs -adjust lyrica to 100mg  tid   -continue trial of cymbalta 20mg  daily   -changed norco to oxycodone for breakthrough, 10-15mg  q4 prn   -prednisone burst starting at 60mg  daily 1/11 for 3 days, then 40mg  daily-->gradual taper  1/14- pain still "horrible"- says cannot really rub/touch limb because it hurts and hard to reach- explained that sometimes rubbing it will actually help pain. For LLE 4. Mood/Behavior/Sleep: LCSW to evaluate and provide emotional support              -antipsychotic agents: n/a   5. Neuropsych/cognition: This patient is capable of making decisions on  his own behalf.             -SLP eval   6. Skin/Wound Care: Routine skin care checks             -left forearm woundVAC per  plastic surgery   7. Fluids/Electrolytes/Nutrition: Strict Is and Os and follow-up chemistries             -renal diet with fluid restriction             -hypoalbuminemia: continue protein supplements             -continue thiamine, folate supplementation   8: Polysubstance abuse: cessation counseling   9: Left forearm compartment syndrome status post fasciotomies 12/21 Dr. Fredna Dow             -wound management per plastic surgery. Myriad sheet placement yesterday             -carpal tunnel release left hand also has been performed             -continue ROM exercises, splint, limit weight bearing to 10 lbs.   10: Acute kidney injury: nephrology following; follow BMP   1/10 Cr falling nicely. Down to 2.79   1/11 RIJ removed yesterday  1/14- labs in AM- con't IVFs NS 50cc/hour- changed diet to regular diet- off renal diet- and stopped fluid restriction.  11: Rhabdomyolysis: CK down to 906   12: Shock liver/elevated LFTs,     -WNL 1/10 13: Left gluteal myositis, ongoing pain: Continue AFO for foot drop             -outpatient NCV/EMG to assess for potential sciatic nv injury             -ROM without restrictions   -see pain mgt above 14: Hyperphosphatemia   15: Anemia,acute blood loss/hemodilution/HD, s/p 1 unit PCs: follow-up CBC   -1/10 hgb down to 7.1--no gross blood loss  -f/u Monday 16: Hypertension: monitor TID and prn             -continue Norvasc 5 mg daily             -hydralazine 25 mg TID    Vitals:   04/13/22 0420 04/13/22 1259  BP: 120/73 (!) 149/88  Pulse: 85 98  Resp: 20 16  Temp: 98.7 F (37.1 C) 98.3 F (36.8 C)  SpO2: 99% 100%   Labile improved this am 1/13  1/14- doing better- con't regimen 17.  Opioid induced constipation  schedule Miralax BID  1/14- per nursing, got sorbitol already with no results- gave Mg citrate 1/2 bottle, and with no results, gave another 1/2 bottle. If no BM by AM, suggest KUB.     I spent a total of 36   minutes on total care today- >50% coordination of care- due to  D/w nursing x2 and prolonged time with pt about bowels, as well as diet/spasms   LOS: 5 days A FACE TO FACE EVALUATION WAS PERFORMED  Eiliyah Reh 04/13/2022, 6:10 PM

## 2022-04-13 NOTE — Progress Notes (Signed)
Patient started with 1/2 bottle of magnesium citrate, no results after 4 hours. Patient finished the 2nd half of the bottle. No bowel movement at this time.

## 2022-04-14 DIAGNOSIS — N179 Acute kidney failure, unspecified: Secondary | ICD-10-CM | POA: Diagnosis not present

## 2022-04-14 DIAGNOSIS — I639 Cerebral infarction, unspecified: Secondary | ICD-10-CM | POA: Diagnosis not present

## 2022-04-14 DIAGNOSIS — I10 Essential (primary) hypertension: Secondary | ICD-10-CM | POA: Diagnosis not present

## 2022-04-14 DIAGNOSIS — T796XXS Traumatic ischemia of muscle, sequela: Secondary | ICD-10-CM | POA: Diagnosis not present

## 2022-04-14 LAB — CBC
HCT: 25.7 % — ABNORMAL LOW (ref 39.0–52.0)
Hemoglobin: 8.4 g/dL — ABNORMAL LOW (ref 13.0–17.0)
MCH: 31.5 pg (ref 26.0–34.0)
MCHC: 32.7 g/dL (ref 30.0–36.0)
MCV: 96.3 fL (ref 80.0–100.0)
Platelets: 238 10*3/uL (ref 150–400)
RBC: 2.67 MIL/uL — ABNORMAL LOW (ref 4.22–5.81)
RDW: 14.6 % (ref 11.5–15.5)
WBC: 7.9 10*3/uL (ref 4.0–10.5)
nRBC: 0.3 % — ABNORMAL HIGH (ref 0.0–0.2)

## 2022-04-14 LAB — BASIC METABOLIC PANEL
Anion gap: 6 (ref 5–15)
BUN: 26 mg/dL — ABNORMAL HIGH (ref 6–20)
CO2: 26 mmol/L (ref 22–32)
Calcium: 8.5 mg/dL — ABNORMAL LOW (ref 8.9–10.3)
Chloride: 109 mmol/L (ref 98–111)
Creatinine, Ser: 1.43 mg/dL — ABNORMAL HIGH (ref 0.61–1.24)
GFR, Estimated: >60 mL/min (ref >60)
Glucose, Bld: 96 mg/dL (ref 70–99)
Potassium: 4.2 mmol/L (ref 3.5–5.1)
Sodium: 141 mmol/L (ref 135–145)

## 2022-04-14 MED ORDER — PREGABALIN 75 MG PO CAPS
150.0000 mg | ORAL_CAPSULE | Freq: Three times a day (TID) | ORAL | Status: DC
  Administered 2022-04-14 – 2022-04-21 (×21): 150 mg via ORAL
  Filled 2022-04-14 (×21): qty 2

## 2022-04-14 MED ORDER — PREGABALIN 50 MG PO CAPS
50.0000 mg | ORAL_CAPSULE | Freq: Once | ORAL | Status: AC
  Administered 2022-04-14: 50 mg via ORAL
  Filled 2022-04-14: qty 1

## 2022-04-14 MED ORDER — NORTRIPTYLINE HCL 25 MG PO CAPS
50.0000 mg | ORAL_CAPSULE | Freq: Every day | ORAL | Status: DC
  Administered 2022-04-14 – 2022-04-20 (×7): 50 mg via ORAL
  Filled 2022-04-14 (×7): qty 2

## 2022-04-14 MED ORDER — SORBITOL 70 % SOLN
60.0000 mL | Freq: Once | Status: AC
  Administered 2022-04-14: 60 mL via ORAL

## 2022-04-14 MED ORDER — PREDNISONE 20 MG PO TABS
30.0000 mg | ORAL_TABLET | Freq: Every day | ORAL | Status: DC
  Administered 2022-04-15: 30 mg via ORAL
  Filled 2022-04-14: qty 1

## 2022-04-14 NOTE — Progress Notes (Signed)
Speech Language Pathology Daily Session Note  Patient Details  Name: [name] [name] MRN: [id number] Date of Birth: February 17, 1981  Today's Date: 04/14/2022 SLP Individual Time: 2440-1027 SLP Individual Time Calculation (min): 43 min  Short Term Goals: Week 1: SLP Short Term Goal 1 (Week 1): Pt will complete additional cognitive-linguistic assessment to  further inform ST POC with 100% completion. SLP Short Term Goal 2 (Week 1): Pt will participate in skilled observation of current diet textures with Sup A for implementation of aspiration precautions. SLP Short Term Goal 3 (Week 1): Pt will demonstrate improved sustained attention to functional task for 10 minutes with Sup A. SLP Short Term Goal 4 (Week 1): Pt will name 2 current physical and 2 cognitive changes s/p hospitalization and how this will impact his stay and d/c with Min A.  Skilled Therapeutic Interventions: Skilled treatment session focused on cognitive goals. Upon arrival, patient was awake while upright in bed. Patient reporting pain in LLE but agreeable to treatment session. SLP facilitated session by providing overall supervision level verbal cues for sustained attention for ~35 minutes during an informal conversation that focused on awareness and recall. Patient demonstrated appropriate awareness regarding current physical injuries and their impact on his overall function. Patient also demonstrated appropriate anticipatory awareness regarding discharge planning. Patient independently recalled pertinent information regarding precautions/restrictions and upcoming plan for LUE. Patient requested to use the urinal and was continent of bladder. Patient left upright in bed with alarm on and all needs within reach. Continue with current plan of care.      Pain Pain Assessment Pain Score: 8   Therapy/Group: Individual Therapy  [name] [name] 04/14/2022, 3:05 PM

## 2022-04-14 NOTE — Progress Notes (Addendum)
Patient ID: [name] [name], male   DOB: 17-Jan-1981, 42 y.o.   MRN: [id number]  SW left message for [facility] Medicaid asking if she can follow-up with SW to discuss assigned case manager for [facility] referral. SW waiting on follow-up.   SW sent tentative referral to [facility] to discuss likelihood of wound vac at discharge. Will need measurements, and HHA once in place.   SW sent out HHPT/OT/SLP/SN referral to various H[facility]. SW waiting on follow-up.   Declined HHAs [name]- due to nursing [name][facility] - OON with insurance [name][name]- does not cover zipcode [place], MSW, [employer] Office: [phone] Cell: [phone] Fax: [phone]

## 2022-04-14 NOTE — Progress Notes (Signed)
Occupational Therapy Session Note  Patient Details  Name: [name] [name] MRN: [id number] Date of Birth: 07/31/1980  Today's Date: 04/14/2022 OT Individual Time: 1000-1045 OT Individual Time Calculation (min): 45 min    Short Term Goals: Week 1:  OT Short Term Goal 1 (Week 1): Pt will transfer to toilet wiht LRAD and CGA OT Short Term Goal 2 (Week 1): Pt will compelte 2/3 steps of LB dressing OT Short Term Goal 3 (Week 1): Pt will don shirt wiht MIN A OT Short Term Goal 4 (Week 1): Pt will recall hemi dressing techniqes wiht MIN question cues  Skilled Therapeutic Interventions/Progress Updates:   Pt in bed upon OT arrival. Pain 10/10 in l LE elevated on pillow with heel floating. Chord Takahashi nursing administering an add dose of pain meds given by MD this am due to pain which did provide some relief as well as OT providing gentle retrograde massage. OT adapted leg lifter and issued and trained in use due to L foot drop and intolerance to L PRAFO boot. Pt able to use to provide some AAROM and edema reduction via facilitated ankle pumps. OT applied Foot-up brace over slipper sock and pt was then able to perform lateral transfer into w/c with min A to R side for sink side self care. Pt required assist to manage wound vac and lines into L UE. Pt able to perform face washing, deoderant application and oral care with min A/CGA due to one handed technique with dom R hand. OT repositioned L LE on leg rest in somewhat elevation on footplate and left pt w/c level with needs, nurse call button, and w/c alarm set. Pain 6/10 reported after session.     Therapy Documentation Precautions:  Precautions Precautions: Fall Precaution Comments: L forearm in dressing from fasciotomies and closure; TDC R IJ 1/2; 10 # weight bearing limitation per Dr Fredna Dow 1/3, no ROM restrictions Restrictions Weight Bearing Restrictions: Yes LUE Weight Bearing: Partial weight bearing LLE Weight Bearing: Partial weight  bearing LLE Partial Weight Bearing Percentage or Pounds: no Other Position/Activity Restrictions: limit to 10# through LUE   Therapy/Group: Individual Therapy  Barnabas Lister 04/14/2022, 7:48 AM

## 2022-04-14 NOTE — Progress Notes (Signed)
Occupational Therapy Session Note  Patient Details  Name: [name] [name] MRN: [id number] Date of Birth: 1980/06/22  Today's Date: 04/14/2022 OT Individual Time: 8756-4332 OT Individual Time Calculation (min): 35 min    Today's Date: 04/14/2022 OT Missed Time: 10 Minutes Missed Time Reason: Unavailable (comment) (Nursing care due to constipation)   Short Term Goals: Week 1:  OT Short Term Goal 1 (Week 1): Pt will transfer to toilet wiht LRAD and CGA OT Short Term Goal 2 (Week 1): Pt will compelte 2/3 steps of LB dressing OT Short Term Goal 3 (Week 1): Pt will don shirt wiht MIN A OT Short Term Goal 4 (Week 1): Pt will recall hemi dressing techniqes wiht MIN question cues  Skilled Therapeutic Interventions/Progress Updates:    Patient agreeable to participate in OT session. Reports severe intermittent shooting pain in LLE that starts in lateral quad muscle and shoots down into his foot/toes.   Patient participated in skilled OT session focusing on pt education on pain management techniques and utilizing TENS unit as pain management modality. Therapist assessed left lateral quad with moderate fascial restrictions palpated.Education provided on use of TENS unit to help decrease pain level and improve ability to participate in self care tasks with less pain and discomfort.  TENS unit utilized for pain management related to nerve pain with 2 electrodes applied to left lateral quad, 10', CH1 66 Hz 114mA. Pt reports some pain relief after use of TENS. Heat pack applied to LLE at end of session. Reccommended use of heat versus ice to help relax muscles.    Therapy Documentation Precautions:  Precautions Precautions: Fall Precaution Comments: L forearm in dressing from fasciotomies and closure; TDC R IJ 1/2; 10 # weight bearing limitation per Dr Fredna Dow 1/3, no ROM restrictions Restrictions Weight Bearing Restrictions: Yes LUE Weight Bearing: Partial weight bearing LLE Weight  Bearing: Partial weight bearing LLE Partial Weight Bearing Percentage or Pounds: no Other Position/Activity Restrictions: limit to 10# through LUE  Therapy/Group: Individual Therapy  Ailene Ravel, OTR/L,CBIS  Supplemental OT - [facility] and [facility] Secure Chat Preferred   04/14/2022, 2:04 PM

## 2022-04-14 NOTE — Progress Notes (Signed)
Physical Therapy Session Note  Patient Details  Name: [name] [name] MRN: [id number] Date of Birth: 01/25/1981  Today's Date: 04/14/2022 PT Individual Time: 1103-1200 PT Individual Time Calculation (min): 57 min   Short Term Goals: Week 1:  PT Short Term Goal 1 (Week 1): Pt will perform bed mobility with CGA PT Short Term Goal 2 (Week 1): Pt will transfer to [facility] with CGA PT Short Term Goal 3 (Week 1): Pt will ambulate 3ft with min assist and LRAD PT Short Term Goal 4 (Week 1): Pt will propell WC >144ft with supervision assist using hemi technique  Skilled Therapeutic Interventions/Progress Updates:    Chart reviewed and pt agreeable to therapy. Pt received seated in WC with 10/10 c/o pain LLE that partially reduced with time spent on repositioning. Session focused on LE strengthening and functional transfers to promote independent mobility. Pt initiated session with 179ft of WC propulsion using MinA for L side management. Pt then transferred to mat table with SPT using MinA. Pt then completed 3x10 B SLR, resisted hib abd/add, LAQ, R DF/PF,  and PROM for R DF/PF. Pt then completed 3 sit to stands with MinA, but felt unable to bear weight on LLE. Pt then transferred back to chair with SPT using MinA. Pt returned to room with 142ft WC propulsion using MinA and transfer to EOB with MinA.  At end of session, pt was left seated EOB with alarm engaged, nurse call bell and all needs in reach.     Therapy Documentation Precautions:  Precautions Precautions: Fall Precaution Comments: L forearm in dressing from fasciotomies and closure; TDC R IJ 1/2; 10 # weight bearing limitation per Dr Fredna Dow 1/3, no ROM restrictions Restrictions Weight Bearing Restrictions: Yes LUE Weight Bearing: Partial weight bearing LLE Weight Bearing: Partial weight bearing LLE Partial Weight Bearing Percentage or Pounds: no Other Position/Activity Restrictions: limit to 10# through LUE         Therapy/Group: Individual Therapy  Marquette Old, PT, DPT 04/14/2022, 12:29 PM

## 2022-04-14 NOTE — Progress Notes (Addendum)
Met with patient. Discussed team conference on every Tuesday. Educated that SW will follow up with discussions regarding goals, progress, barriers, equipments and discharge date. Unable to locate binder in room. Making another once. Verified that still had wound vac on and plugged in wall. Patient reports that is having a lot of burning sensation to left leg. He reports that is very painful. Also reports that because he is taking pain medications that he is "stopped up". He reports that will need 24/7 care when going home.  All questions answered. All needs met. Will bring binder back to room.    Taking education binder to room.  Added ASA for stroke prophylaxis. Trig 422 needs to cut back on fried foods and sauces. Removed fluid restrictions. Not wearing PRAFO boot because causes pain. Encouraged use because needs to be able to flex. OT working with patient. Suggested using foot board and/or flex band to work on flexion. OT continuing to work on other strategies to manage. Also provided smoking cessation.

## 2022-04-14 NOTE — Progress Notes (Deleted)
Patient ID: [name] [name], male   DOB: May 09, 1980, 42 y.o.   MRN: [id number]  SW sent tentative referral to [facility] to discuss likelihood of wound vac at discharge. Will need measurements, and HHA once in place.  SW sent out HHPT/OT/SLP/SN referral to various [facility]. SW waiting on follow-up.   [name], MSW, [place] Office: [phone] Cell: [phone] Fax: [phone]

## 2022-04-14 NOTE — Progress Notes (Signed)
No bowel movement this shift, miralax and colace given. Patient also received mag. Citrate 1 bottle and sorbitol 04/13/22 in am.

## 2022-04-14 NOTE — Progress Notes (Signed)
PROGRESS NOTE   Subjective/Complaints:  Had only tiny bm yesterday. Left leg still tender. Feels better when he can rub it, but he cannot reach it. Sleep affected by pain  ROS: Patient denies fever, rash, sore throat, blurred vision, dizziness, nausea, vomiting, diarrhea, cough, shortness of breath or chest pain,  headache, or mood change.     ROS:   Pt denies SOB, abd pain, CP, N/V (+/)C/D, and vision changes Except for HPI Objective:   No results found. Recent Labs    04/14/22 0520  WBC 7.9  HGB 8.4*  HCT 25.7*  PLT 238    Recent Labs    04/14/22 0520  NA 141  K 4.2  CL 109  CO2 26  GLUCOSE 96  BUN 26*  CREATININE 1.43*  CALCIUM 8.5*     Intake/Output Summary (Last 24 hours) at 04/14/2022 0917 Last data filed at 04/14/2022 0840 Gross per 24 hour  Intake 1428 ml  Output 2060 ml  Net -632 ml        Physical Exam: Vital Signs Blood pressure 126/80, pulse 82, temperature 98.5 F (36.9 C), temperature source Oral, resp. rate 18, height 5\' 7"  (1.702 m), weight 98.5 kg, SpO2 97 %.    Constitutional: No distress . Vital signs reviewed. HEENT: NCAT, EOMI, oral membranes moist Neck: supple Cardiovascular: RRR without murmur. No JVD    Respiratory/Chest: CTA Bilaterally without wheezes or rales. Normal effort    GI/Abdomen: BS +, non-tender, non-distended Ext: no clubbing, cyanosis, or edema Psych: pleasant and cooperative  Skin: tats, a few abrasions and healing wounds/dressed Neuro: Alert and oriented x 3. Normal insight and awareness. Intact Memory. Normal language and speech. Cranial nerve exam unremarkable  Moves all 4's. Unable to wiggle toes, adf/apf LLE--no changes. Wiggles fingers Left hand Musculoskeletal: left arm splinted. Left leg tender proximally from upper third of thigh with the most pain distally at foot.--no changes although I felt he tolerated movement a bit better today     Assessment/Plan: 1. Functional deficits which require 3+ hours per day of interdisciplinary therapy in a comprehensive inpatient rehab setting. Physiatrist is providing close team supervision and 24 hour management of active medical problems listed below. Physiatrist and rehab team continue to assess barriers to discharge/monitor patient progress toward functional and medical goals  Care Tool:  Bathing    Body parts bathed by patient: Left arm, Chest, Abdomen, Front perineal area, Buttocks, Right upper leg, Left upper leg   Body parts bathed by helper: Right arm, Right lower leg, Left lower leg     Bathing assist Assist Level: Moderate Assistance - Patient 50 - 74%     Upper Body Dressing/Undressing Upper body dressing   What is the patient wearing?: Pull over shirt    Upper body assist Assist Level: Maximal Assistance - Patient 25 - 49%    Lower Body Dressing/Undressing Lower body dressing      What is the patient wearing?: Pants     Lower body assist Assist for lower body dressing: Total Assistance - Patient < 25%     Toileting Toileting    Toileting assist Assist for toileting: Maximal Assistance - Patient 25 - 49%  Transfers Chair/bed transfer  Transfers assist     Chair/bed transfer assist level: Moderate Assistance - Patient 50 - 74%     Locomotion Ambulation   Ambulation assist      Assist level: Moderate Assistance - Patient 50 - 74% Assistive device: Parallel bars Max distance: 5   Walk 10 feet activity   Assist  Walk 10 feet activity did not occur: Safety/medical concerns        Walk 50 feet activity   Assist Walk 50 feet with 2 turns activity did not occur: Safety/medical concerns         Walk 150 feet activity   Assist Walk 150 feet activity did not occur: Safety/medical concerns         Walk 10 feet on uneven surface  activity   Assist Walk 10 feet on uneven surfaces activity did not occur: Safety/medical  concerns         Wheelchair     Assist Is the patient using a wheelchair?: Yes Type of Wheelchair: Manual    Wheelchair assist level: Minimal Assistance - Patient > 75% Max wheelchair distance: 100    Wheelchair 50 feet with 2 turns activity    Assist        Assist Level: Minimal Assistance - Patient > 75%   Wheelchair 150 feet activity     Assist      Assist Level: Moderate Assistance - Patient 50 - 74%   Blood pressure 126/80, pulse 82, temperature 98.5 F (36.9 C), temperature source Oral, resp. rate 18, height 5\' 7"  (1.702 m), weight 98.5 kg, SpO2 97 %.  Medical Problem List and Plan: 1. Functional deficits secondary to bilateral acute/subacute cerebellar infarcts; left forearm compartment syndrome             -patient may shower if left arm covered             -ELOS/Goals: 12-14 days, mod I to supervision goals             -no PRAFO LLE for now due pain/sensitivity  -Continue CIR therapies including PT, OT  2.  Antithrombotics: -DVT/anticoagulation:  Pharmaceutical: Heparin             -antiplatelet therapy: aspirin 81 mg daily   3. 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 Will see if PT can help him make some type of rubbing stick (using towel)   -increase lyrica to 150mg  tid   -increase pamelor to 50mg  qhs   -reduce prednisone to 30mg  daily starting tomorrow 4. Mood/Behavior/Sleep: LCSW to evaluate and provide emotional support              -antipsychotic agents: n/a   5. Neuropsych/cognition: This patient is capable of making decisions on  his own behalf.             -SLP eval   6. Skin/Wound Care: Routine skin care checks             -  left forearm woundVAC per plastic surgery   7. Fluids/Electrolytes/Nutrition: Strict Is and Os and follow-up chemistries             -renal diet with fluid restriction             -hypoalbuminemia: continue protein supplements             -continue thiamine, folate supplementation   8: Polysubstance abuse: cessation counseling   9: Left forearm compartment syndrome status post fasciotomies 12/21 Dr. Fredna Dow             -wound management per plastic surgery. Myriad sheet placement yesterday             -carpal tunnel release left hand also has been performed             -continue ROM exercises, splint, limit weight bearing to 10 lbs.   10: Acute kidney injury: nephrology following; follow BMP   1/10 Cr falling nicely. Down to 2.79   1/11 RIJ removed yesterday  1/15 labs continue to improve. Dc ivf -encourage appropriate po intake  11: Rhabdomyolysis: CK down to 906   12: Shock liver/elevated LFTs,     -WNL 1/10 13: Left gluteal myositis, ongoing pain: Continue AFO for foot drop             -outpatient NCV/EMG to assess for potential sciatic nv injury             -ROM without restrictions   -see pain mgt above 14: Hyperphosphatemia   15: Anemia,acute blood loss/hemodilution/HD, s/p 1 unit PCs: follow-up CBC   -1/10 hgb down to 7.1--no gross blood loss  -f/u Monday 16: Hypertension: monitor TID and prn             -continue Norvasc 5 mg daily             -hydralazine 25 mg TID    Vitals:   04/13/22 1927 04/14/22 0156  BP: (!) 142/84 126/80  Pulse: 96 82  Resp: 18 18  Temp: 97.9 F (36.6 C) 98.5 F (36.9 C)  SpO2: 98% 97%     1/15 fair control 17.  Opioid induced constipation schedule Miralax BID  1/15 repeat sorbitol 60 cc today   -if no bm, then sse  LOS: 6 days A  FACE TO FACE EVALUATION WAS PERFORMED  Meredith Staggers 04/14/2022, 9:17 AM

## 2022-04-15 MED ORDER — CALCIUM POLYCARBOPHIL 625 MG PO TABS
625.0000 mg | ORAL_TABLET | Freq: Every day | ORAL | Status: DC
  Administered 2022-04-15 – 2022-04-21 (×6): 625 mg via ORAL
  Filled 2022-04-15 (×7): qty 1

## 2022-04-15 MED ORDER — PREDNISONE 20 MG PO TABS
20.0000 mg | ORAL_TABLET | Freq: Every day | ORAL | Status: DC
  Administered 2022-04-16: 20 mg via ORAL
  Filled 2022-04-15: qty 1

## 2022-04-15 MED ORDER — SORBITOL 70 % SOLN
960.0000 mL | TOPICAL_OIL | Freq: Once | ORAL | Status: DC
  Filled 2022-04-15: qty 240

## 2022-04-15 MED ORDER — DULOXETINE HCL 20 MG PO CPEP
40.0000 mg | ORAL_CAPSULE | Freq: Every day | ORAL | Status: DC
  Administered 2022-04-16 – 2022-04-18 (×3): 40 mg via ORAL
  Filled 2022-04-15 (×3): qty 2
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
## 2022-04-15 NOTE — Patient Care Conference (Signed)
Inpatient RehabilitationTeam Conference and Plan of Care Update Date: 04/15/2022   Time: 10:008 AM    Patient Name: [name] [name]      Medical Record Number: [id number]  Date of Birth: 22-Sep-1980 Sex: Male         Room/Bed: 4M10C/4M10C-01 Payor Info: Payor: [place] / Plan: [place] MEDICAID [id number] / Product Type: *No Product type* /    Admit Date/Time:  04/08/2022  5:36 PM  Primary Diagnosis:  Acute ischemic stroke [facility])  Hospital Problems: Principal Problem:   Acute ischemic stroke [facility])    Expected Discharge Date: Expected Discharge Date: 04/21/22  Team Members Present: Physician leading conference: Dr. Alger Simons Social Worker Present: [name], [place] Nurse Present: Tacy Learn, RN PT Present: Tereasa Coop, PT OT Present: Mariane Masters, OT SLP Present: Weston Anna, SLP     Current Status/Progress Goal Weekly Team Focus  Bowel/Bladder   constipation-taking colace & miralax bid. sorbitol prn. Continent of bladder, using urinal   Regular BM's. Remain continent of B&B.   Adjust laxatives.    Swallow/Nutrition/ Hydration   Regular textures with thin liquids, Supervision-Mod I   Supervision  use of swallowing compensatory strategies    ADL's   MIN A transfers, MIN A LB dressing at sit to stand level, AE training which pt is resistive to, limited by severe nerve pain and low frustration tolerance to adaptive strategies   supervision-MIN A   balance, tolernace to sitting/standing, WB onto LLE, TENS for pain managment, BADL retraining    Mobility   minA bed moiblity and stand pivot transfers, minA WC x100', modA 5' ambulation in parallel bars   Supervision  Transfers, balance, increased gait, pain tolerance    Communication                Safety/Cognition/ Behavioral Observations  Supervision-Min A   Supervision-Min A   attention, functional recall    Pain   rating pain 9-10 on pain scale. Taking PRN  Oxy ir 15mg 's& prn flexeril. Also taking scheduled pamelor and lyrica.   <4 on pain scale   assess every 4 hours. Attempt to manage pain better.    Skin   LUE with wound VAC   wound healing, no skin breakdown.  Assess evey shift and PRN      Discharge Planning:  Pt to d/c to home with his sister Rodena Piety. She works during the day, however, boyfriend at the home during her work hours. SW will submit PCS referral once aware of appropriate staff contact. Pt has planned surgery on 1/18 for wound vac change. [facility] referral for wound vac sent to [facility], and [facility] referral sent out. SW will confirm there are no barriers to discharge.   Team Discussion: Acute ischemic stroke. Continent B/B. Constipation with Sorbitol administered with small results. SMOG ordered today.  Medication adjustments for pain control. Wound Vac change in OR 01/18 to left arm. Renal function improving, IV fluids stopped. Therapies limited due to pain/anxiety and self limiting factors affecting progress in therapy with adaptive equipment. Cognition overall good. Able to recall precautions. Patient reports that has concerns regarding his safety as well as his sister's safety once home.  Patient on target to meet rehab goals: yes, w/c propulsion 100' and Mod A 5' in parallel bars.  *See Care Plan and progress notes for long and short-term goals.   Revisions to Treatment Plan:  Medication adjustments, OR 01/18 and return to unit, monitor labs, monitor weight  Teaching  Needs: Medications, safety, gait/transfer training, home exercise plan, skin/wound management, etc.   Current Barriers to Discharge: Home enviroment access/layout, Wound care, Lack of/limited family support, Weight, Weight bearing restrictions, Medication compliance, and Behavior  Possible Resolutions to Barriers: Family education, nursing education, skin/wound care, order recommended DME     Medical Summary Current Status: still with uncontrolled pain.  anxiety still an issue. working on adjusting regimen, renal function improving  Barriers to Discharge: Medical stability;Uncontrolled Pain;Complicated Wound   Possible Resolutions to [employer]: rx pain as above, wound care per plastic surgery   Continued Need for Acute Rehabilitation Level of Care: The patient requires daily medical management by a physician with specialized training in physical medicine and rehabilitation for the following reasons: Direction of a multidisciplinary physical rehabilitation program to maximize functional independence : Yes Medical management of patient stability for increased activity during participation in an intensive rehabilitation regime.: Yes Analysis of laboratory values and/or radiology reports with any subsequent need for medication adjustment and/or medical intervention. : Yes   I attest that I was present, lead the team conference, and concur with the assessment and plan of the team.   Ernest Pine 04/15/2022, 12:51 PM

## 2022-04-15 NOTE — Progress Notes (Signed)
Occupational Therapy Session Note  Patient Details  Name: [name] [name] MRN: [id number] Date of Birth: 1981-01-14  Today's Date: 04/15/2022 OT Individual Time: 0704-0800 OT Individual Time Calculation (min): 56 min    Short Term Goals: Week 1:  OT Short Term Goal 1 (Week 1): Pt will transfer to toilet wiht LRAD and CGA OT Short Term Goal 2 (Week 1): Pt will compelte 2/3 steps of LB dressing OT Short Term Goal 3 (Week 1): Pt will don shirt wiht MIN A OT Short Term Goal 4 (Week 1): Pt will recall hemi dressing techniqes wiht MIN question cues  Skilled Therapeutic Interventions/Progress Updates:    Pt received in bed with unrated, high pain in LLE. Pt continues to c/o pain despite light stretching provided towards end of session. RN aware tobring medications once available.   ADL: Pt completes grooming at sink with set up with encouragement to use BUE to open deodorant. MIN A for pulling shirt down back and fully threading LLE into pants. Pt needs MIN A for sit to stand at sink and encouargemetn to use RUE to advance pants fully past hips as pt is very self limiting with ADLs prematurely requesting A. Prolonged rest breaks PRN.   Therapeutic activity OT applies theraband wrap to use with LUE on w/c along with R extremities for generalized conditioning. Pt completes squat/stand pivot transfers with poor controlled decent and throwing self back into supine once landed. Discussed safety issues with throwing self backwards at edge of mat and bed. Little evidence of learning as it continued to happen throughout session. Pt supine on mat as pt requesting to stretch his hip because it feels tight. Instructed pt through internal and external rotation stretch from hooklying position by having pt turn knee in or out. Pt with more pain performing internal rotation with OT holding LE into position with no overpressure. Discussed limited ROM in internal rotation d/t persistent positioning with LE  externally rotated in bed. Pt encouraged to transfer to L side with option of lateral scoot v slide board v stand pivot. Pt initially refuses to transfer to the L stating "it hurts too bad to put weigth onto my L hip" but then eventually agreeable to stand pivot  with TDWB on LLE. Discussed need to be able ot transfer to both sides since pt will not always be able to adjust the environment to only transfer to R.   Pt left at end of session in bed with exit alarm on, call light in reach and all needs met   Therapy Documentation Precautions:  Precautions Precautions: Fall Precaution Comments: L forearm in dressing from fasciotomies and closure; TDC R IJ 1/2; 10 # weight bearing limitation per Dr Fredna Dow 1/3, no ROM restrictions Restrictions Weight Bearing Restrictions: Yes LUE Weight Bearing: Partial weight bearing LLE Weight Bearing: Partial weight bearing LLE Partial Weight Bearing Percentage or Pounds: no Other Position/Activity Restrictions: limit to 10# through LUE General:   Therapy/Group: Individual Therapy  [name] 04/15/2022, 6:49 AM

## 2022-04-15 NOTE — Progress Notes (Signed)
PROGRESS NOTE   Subjective/Complaints: Only small bm with sorbitol and SSE yesterday. Ongoing leg pain. Feels a "knot" on left hip which he feels came on after therapy yesterdy  ROS: Patient denies fever, rash, sore throat, blurred vision, dizziness, nausea, vomiting, diarrhea, cough, shortness of breath or chest pain  headache, or mood change.    Objective:   No results found. Recent Labs    04/14/22 0520  WBC 7.9  HGB 8.4*  HCT 25.7*  PLT 238    Recent Labs    04/14/22 0520  NA 141  K 4.2  CL 109  CO2 26  GLUCOSE 96  BUN 26*  CREATININE 1.43*  CALCIUM 8.5*     Intake/Output Summary (Last 24 hours) at 04/15/2022 0915 Last data filed at 04/15/2022 0724 Gross per 24 hour  Intake 912 ml  Output 1501 ml  Net -589 ml        Physical Exam: Vital Signs Blood pressure 131/75, pulse 88, temperature 97.8 F (36.6 C), temperature source Oral, resp. rate 19, height 5\' 7"  (1.702 m), weight 98.5 kg, SpO2 98 %.    Constitutional: No distress . Vital signs reviewed. HEENT: NCAT, EOMI, oral membranes moist Neck: supple Cardiovascular: RRR without murmur. No JVD    Respiratory/Chest: CTA Bilaterally without wheezes or rales. Normal effort    GI/Abdomen: BS +, non-tender, non-distended Ext: no clubbing, cyanosis, or edema Psych: pleasant and cooperative  Skin: tats, a few abrasions and healing wounds/dressed Neuro: Alert and oriented x 3. Normal insight and awareness. Intact Memory. Normal language and speech. Cranial nerve exam unremarkable  Moves all 4's. Unable to wiggle toes, adf/apf LLE--no changes. Wiggles fingers Left hand Musculoskeletal: left arm splinted. Left leg tender proximally from upper third of thigh with the most pain distally at foot.-has discomfort near left greater troch with palpation. No muscle spasms   Assessment/Plan: 1. Functional deficits which require 3+ hours per day of interdisciplinary  therapy in a comprehensive inpatient rehab setting. Physiatrist is providing close team supervision and 24 hour management of active medical problems listed below. Physiatrist and rehab team continue to assess barriers to discharge/monitor patient progress toward functional and medical goals  Care Tool:  Bathing    Body parts bathed by patient: Left arm, Chest, Abdomen, Front perineal area, Buttocks, Right upper leg, Left upper leg   Body parts bathed by helper: Right arm, Right lower leg, Left lower leg     Bathing assist Assist Level: Moderate Assistance - Patient 50 - 74%     Upper Body Dressing/Undressing Upper body dressing   What is the patient wearing?: Pull over shirt    Upper body assist Assist Level: Maximal Assistance - Patient 25 - 49%    Lower Body Dressing/Undressing Lower body dressing      What is the patient wearing?: Pants     Lower body assist Assist for lower body dressing: Total Assistance - Patient < 25%     Toileting Toileting    Toileting assist Assist for toileting: Maximal Assistance - Patient 25 - 49%     Transfers Chair/bed transfer  Transfers assist     Chair/bed transfer assist level: Moderate Assistance -  Patient 50 - 74%     Locomotion Ambulation   Ambulation assist      Assist level: Moderate Assistance - Patient 50 - 74% Assistive device: Parallel bars Max distance: 5   Walk 10 feet activity   Assist  Walk 10 feet activity did not occur: Safety/medical concerns        Walk 50 feet activity   Assist Walk 50 feet with 2 turns activity did not occur: Safety/medical concerns         Walk 150 feet activity   Assist Walk 150 feet activity did not occur: Safety/medical concerns         Walk 10 feet on uneven surface  activity   Assist Walk 10 feet on uneven surfaces activity did not occur: Safety/medical concerns         Wheelchair     Assist Is the patient using a wheelchair?: Yes Type of  Wheelchair: Manual    Wheelchair assist level: Minimal Assistance - Patient > 75% Max wheelchair distance: 100    Wheelchair 50 feet with 2 turns activity    Assist        Assist Level: Minimal Assistance - Patient > 75%   Wheelchair 150 feet activity     Assist      Assist Level: Moderate Assistance - Patient 50 - 74%   Blood pressure 131/75, pulse 88, temperature 97.8 F (36.6 C), temperature source Oral, resp. rate 19, height 5\' 7"  (1.702 m), weight 98.5 kg, SpO2 98 %.  Medical Problem List and Plan: 1. Functional deficits secondary to bilateral acute/subacute cerebellar infarcts; left forearm compartment syndrome             -patient may shower if left arm covered             -ELOS/Goals: 12-14 days, mod I to supervision goals             -no PRAFO LLE for now due pain/sensitivity  -Continue CIR therapies including PT and OT. Interdisciplinary team conference today to discuss goals, barriers to discharge, and dc planning.  2.  Antithrombotics: -DVT/anticoagulation:  Pharmaceutical: Heparin             -antiplatelet therapy: aspirin 81 mg daily   3. 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 Will see if PT can help him make some type of rubbing stick (using towel)   -increase lyrica to 150mg  tid   -increase pamelor to 50mg  qhs   -reduced prednisone to 30mg  daily starting 1/16--continue to taper to off 4. Mood/Behavior/Sleep: LCSW to evaluate and provide  emotional support             -antipsychotic agents: n/a   5. Neuropsych/cognition: This patient is capable of making decisions on  his own behalf.             -SLP eval   6. Skin/Wound Care: Routine skin care checks             -  left forearm woundVAC per plastic surgery--vac change/myriad Thurs   7. Fluids/Electrolytes/Nutrition: Strict Is and Os and follow-up chemistries             -renal diet with fluid restriction             -hypoalbuminemia: continue protein supplements             -continue thiamine, folate supplementation   8: Polysubstance abuse: cessation counseling   9: Left forearm compartment syndrome status post fasciotomies 12/21 Dr. Fredna Dow             -wound management per plastic surgery. Myriad sheet placement yesterday             -carpal tunnel release left hand also has been performed             -continue ROM exercises, splint, limit weight bearing to 10 lbs.   10: Acute kidney injury: nephrology following; follow BMP   1/10 Cr falling nicely. Down to 2.79   1/11 RIJ removed yesterday  1/15 labs continue to improve. Dc'ws ivf -encourage appropriate po intake  11: Rhabdomyolysis: CK down to 906   12: Shock liver/elevated LFTs,     -WNL 1/10 13: Left gluteal myositis, ongoing pain: Continue AFO for foot drop             -outpatient NCV/EMG to assess for potential sciatic nv injury             -ROM without restrictions   -see pain mgt above 14: Hyperphosphatemia   15: Anemia,acute blood loss/hemodilution/HD, s/p 1 unit PCs: follow-up CBC   -1/10 hgb down to 7.1--no gross blood loss  -f/u Monday 16: Hypertension: monitor TID and prn             -continue Norvasc 5 mg daily             -hydralazine 25 mg TID    Vitals:   04/14/22 1955 04/15/22 0339  BP: (!) 184/95 131/75  Pulse: 99 88  Resp: 19   Temp: 97.9 F (36.6 C) 97.8 F (36.6 C)  SpO2: 100% 98%     1/15 fair control 17.  Opioid induced constipation schedule Miralax BID  1/15 repeatws  sorbitol 60 cc, SSE--small bm  1/16 try SMOG enema today  LOS: 7 days A FACE TO FACE EVALUATION WAS PERFORMED  Meredith Staggers 04/15/2022, 9:15 AM

## 2022-04-15 NOTE — Progress Notes (Signed)
Patient ID: [name] [name], male   DOB: March 07, 1981, 42 y.o.   MRN: [id number]  SW received message from [facility] reporting that referral for [facility] services and referral forms faxed. She also reported that there was a recommendation of SNF placement and will be sent to medical director for further review.  [id number] returned phone call to [facility] to request form be sent to SW. SW also informed there is no recommendation of SNF placement, so unsure on where this information is noted.   SW met with pt, pt sister and pt mother in room to provide updates from team conference, and d/c date 1/22. SW informed on challenges with obtaining HH and will continue to work on this pending if the wound vac is needed at discharge. SW shared will confirm all d/c recommendations once aware. Fam edu scheduled for Friday 9am-12pm.  *SW later received documentation informing SW that his continued stay was denied. SW informed medical team. Attending to submit appeal.   61- SW spoke with Jocelyn Lamer to request PCS form faxed to SW. States she sent the referral to appropriate department and LTSS will follow-up with the patient. SW discussed the SNF placement reference. She was referring to what is indicated on IPOC. SW shared this could be a potential barrier if needed, however, not indicated in any note. She states it should not be listed if not recommended. SW shared it is a way to be proactive since there is no knowledge on if the discharge plan will change. Will wait for appeal process to take place with medical directors for a decision.   [name], MSW, [place] Office: [phone] Cell: [phone] Fax: [phone]

## 2022-04-15 NOTE — Progress Notes (Signed)
Physical Therapy Session Note  Patient Details  Name: [name] [name] MRN: [id number] Date of Birth: 12-Aug-1980  Today's Date: 04/15/2022 PT Individual Time: 9509-3267 and 1245-8099 PT Individual Time Calculation (min): 60 min and 58 min.  Short Term Goals: Week 1:  PT Short Term Goal 1 (Week 1): Pt will perform bed mobility with CGA PT Short Term Goal 2 (Week 1): Pt will transfer to [facility] with CGA PT Short Term Goal 3 (Week 1): Pt will ambulate 53ft with min assist and LRAD PT Short Term Goal 4 (Week 1): Pt will propell WC >13ft with supervision assist using hemi technique  Skilled Therapeutic Interventions/Progress Updates:   First session:  Pt presents supine in bed and agreeable to therapy.  Pt states increased pain and "twisted" from earlier rx.  Pt transfers sup to sit EOB w/ supervision, but use of siderails.  Pt performed LAQ, hip flexion to LLE at EOB or reclined back on pillows.  Pt very impulsive and just starts doing movements w/o cueing.  Pt performed sit to stand transfers w/ mod A, under L arm and SPT to w/c.  Pt takes increased time for positioning of L foot for comfort.  Pt negotiated w/c out of room and down hallway using B UE s (L hand for just maintaining straight line) and Min A from PT.  Pt performed SPT/squat pivot transfer w/c <> mat table.  Pt returned to room w/ RUE only and min A to maintain straight line.  Pt transferred sit to stand w/ mod A and SPT w/c > bed.  Pt returns to supine w/ supervision w/ pillows for positioning/comfort.  600 ml urine emptied from urinal, NT notified  Bed alarm on and all needs in reach.  Second session:  Pt presents seated in w/c and agreeable to therapy.  Pt negotiated w/c using R arm and R leg min to CGA x 150'.  Pt performed sit to stand multiple trials in // bars w/ CGA to supervision, pulling up on R bar.  Pt unable to extend L knee fully, c/o tightness in L hip, passive overpressure to L knee.  Pt requires constant cueing to  avoid holding  breath.   Massage given to HS tendons w/ statements of relief.  Pt states need to use BR, c/o constipation but has been unable to have BM.  Pt wheeled to room and into BR.  Pt transfers sit to stand w/ mod A and max A to total A for clothing management as holds hand rail.  Pt self disimpacts in BR w/ statements of relief.  Pt states "whole body feels better".Pt given washcloth for hand.  Pt stands at rail for total A pericare.  NT and nurse to observe extensive BM w/ some blood.  Pt wheeled to sink and washed hands.  Pt returned to gym for sit to stand in // bars w/ CGA.  Pt wheeled to room using R UE and min A.  Pt performs SPT w/ mod A/min A and bed rail.  Bed alarm on and all needs in reach.      Therapy Documentation Precautions:  Precautions Precautions: Fall Precaution Comments: L forearm in dressing from fasciotomies and closure; TDC R IJ 1/2; 10 # weight bearing limitation per Dr Fredna Dow 1/3, no ROM restrictions Restrictions Weight Bearing Restrictions: Yes LUE Weight Bearing: Partial weight bearing LLE Weight Bearing: Partial weight bearing LLE Partial Weight Bearing Percentage or Pounds: no Other Position/Activity Restrictions: limit to 10# through LUE General:  Vital Signs:   Pain:10/10     Therapy/Group: Individual Therapy  [name] 04/15/2022, 12:26 PM

## 2022-04-15 NOTE — Progress Notes (Signed)
Occupational Therapy Session Note  Patient Details  Name: [name] [name] MRN: [id number] Date of Birth: [birth year]  Today's Date: 04/15/2022 OT Individual Time: 7893-8101 OT Individual Time Calculation (min): 45 min    Short Term Goals: Week 2:   LTG=STG 2/2 ELOS  Skilled Therapeutic Interventions/Progress Updates:    Pt greeted in bed trying to get his pants off. Pt stated he tried to pee and got urine all over his pants. Pt needed OT assist to get pants off of L leg  without a reacher. Pt reported feeling like he pulled his glute and groid when rolling. OT applied e-stim to L glute and posterior hip for pain management. Pt with swelling on hip. OT applied kinesiotape for edema and pain management on L hip and outer thigh. Pt completed 3 sets of 10 finger opposition on L hand for finger ROM. Pt left with L LE elevated on pillows. Pt reported improved pain afterwards. Call bell in reach, bed alarm on, and needs met.   Therapy Documentation Precautions:  Precautions Precautions: Fall Precaution Comments: L forearm in dressing from fasciotomies and closure; TDC R IJ 1/2; 10 # weight bearing limitation per Dr Fredna Dow 1/3, no ROM restrictions Restrictions Weight Bearing Restrictions: Yes LUE Weight Bearing: Partial weight bearing LLE Weight Bearing: Partial weight bearing LLE Partial Weight Bearing Percentage or Pounds: no Other Position/Activity Restrictions: limit to 10# through LUE Pain: Pain Assessment Pain Score: 8/10- rest and repositioned, NMES and kinesiotape.     Therapy/Group: Individual Therapy  Valma Cava 04/15/2022, 2:35 PM

## 2022-04-16 DIAGNOSIS — S41102A Unspecified open wound of left upper arm, initial encounter: Secondary | ICD-10-CM

## 2022-04-16 MED ORDER — PREDNISONE 10 MG PO TABS
10.0000 mg | ORAL_TABLET | Freq: Every day | ORAL | Status: DC
  Administered 2022-04-17 – 2022-04-18 (×2): 10 mg via ORAL
  Filled 2022-04-16 (×2): qty 1

## 2022-04-16 MED ORDER — CHLORHEXIDINE GLUCONATE CLOTH 2 % EX PADS
6.0000 | MEDICATED_PAD | Freq: Once | CUTANEOUS | Status: AC
  Administered 2022-04-16: 6 via TOPICAL

## 2022-04-16 NOTE — Progress Notes (Addendum)
PROGRESS NOTE   Subjective/Complaints: Still c/o knot in left hip. Feels that a movement in therapy caused his leg to hurt more, tingling up to knee now.   ROS: Patient denies fever, rash, sore throat, blurred vision, dizziness, nausea, vomiting, diarrhea, cough, shortness of breath or chest pain, joint or back/neck pain, headache, or mood change.    Objective:   No results found. Recent Labs    04/14/22 0520  WBC 7.9  HGB 8.4*  HCT 25.7*  PLT 238    Recent Labs    04/14/22 0520  NA 141  K 4.2  CL 109  CO2 26  GLUCOSE 96  BUN 26*  CREATININE 1.43*  CALCIUM 8.5*     Intake/Output Summary (Last 24 hours) at 04/16/2022 0823 Last data filed at 04/16/2022 0700 Gross per 24 hour  Intake 1117 ml  Output 4800 ml  Net -3683 ml        Physical Exam: Vital Signs Blood pressure 131/85, pulse 95, temperature 97.6 F (36.4 C), resp. rate 20, height 5\' 7"  (1.702 m), weight 98.5 kg, SpO2 100 %.    Constitutional: No distress . Vital signs reviewed. HEENT: NCAT, EOMI, oral membranes moist Neck: supple Cardiovascular: RRR without murmur. No JVD    Respiratory/Chest: CTA Bilaterally without wheezes or rales. Normal effort    GI/Abdomen: BS +, non-tender, non-distended Ext: no clubbing, cyanosis, or edema Psych: pleasant and cooperative   Skin: tats, a few abrasions and healing wounds/dressed Neuro: Alert and oriented x 3. Normal insight and awareness. Intact Memory. Normal language and speech. Cranial nerve exam unremarkable  Moves all 4's. Unable to wiggle toes, adf/apf LLE--no changes. Wiggles fingers Left hand Musculoskeletal: left arm splinted. Left leg tender proximally from upper third of thigh with the most pain distally at foot.-has discomfort near left greater troch with palpation. No muscle spasms appreciated. Seems to tolerate tactile stimulation better today.   Assessment/Plan: 1. Functional deficits  which require 3+ hours per day of interdisciplinary therapy in a comprehensive inpatient rehab setting. Physiatrist is providing close team supervision and 24 hour management of active medical problems listed below. Physiatrist and rehab team continue to assess barriers to discharge/monitor patient progress toward functional and medical goals  Care Tool:  Bathing    Body parts bathed by patient: Left arm, Chest, Abdomen, Front perineal area, Buttocks, Right upper leg, Left upper leg   Body parts bathed by helper: Right arm, Right lower leg, Left lower leg     Bathing assist Assist Level: Moderate Assistance - Patient 50 - 74%     Upper Body Dressing/Undressing Upper body dressing   What is the patient wearing?: Pull over shirt    Upper body assist Assist Level: Maximal Assistance - Patient 25 - 49%    Lower Body Dressing/Undressing Lower body dressing      What is the patient wearing?: Pants     Lower body assist Assist for lower body dressing: Total Assistance - Patient < 25%     Toileting Toileting    Toileting assist Assist for toileting: Maximal Assistance - Patient 25 - 49%     Transfers Chair/bed transfer  Transfers assist  Chair/bed transfer assist level: Moderate Assistance - Patient 50 - 74%     Locomotion Ambulation   Ambulation assist      Assist level: Moderate Assistance - Patient 50 - 74% Assistive device: Parallel bars Max distance: 5   Walk 10 feet activity   Assist  Walk 10 feet activity did not occur: Safety/medical concerns        Walk 50 feet activity   Assist Walk 50 feet with 2 turns activity did not occur: Safety/medical concerns         Walk 150 feet activity   Assist Walk 150 feet activity did not occur: Safety/medical concerns         Walk 10 feet on uneven surface  activity   Assist Walk 10 feet on uneven surfaces activity did not occur: Safety/medical concerns          Wheelchair     Assist Is the patient using a wheelchair?: Yes Type of Wheelchair: Manual    Wheelchair assist level: Minimal Assistance - Patient > 75% (min to CGA when using R foot on floor, pt received Crocs today.) Max wheelchair distance: 150    Wheelchair 50 feet with 2 turns activity    Assist        Assist Level: Minimal Assistance - Patient > 75%   Wheelchair 150 feet activity     Assist      Assist Level: Minimal Assistance - Patient > 75%   Blood pressure 131/85, pulse 95, temperature 97.6 F (36.4 C), resp. rate 20, height 5\' 7"  (1.702 m), weight 98.5 kg, SpO2 100 %.  Medical Problem List and Plan: 1. Functional deficits secondary to bilateral acute/subacute cerebellar infarcts; left forearm compartment syndrome             -patient may shower if left arm covered             -ELOS/Goals: 12-14 days, mod I to supervision goals             -no PRAFO LLE for now due pain/sensitivity  -Continue CIR therapies including PT, OT  2.  Antithrombotics: -DVT/anticoagulation:  Pharmaceutical: Heparin             -antiplatelet therapy: aspirin 81 mg daily   3. 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 Will see if PT can help him make some type of rubbing stick (using towel)   -continue cymbalta (now 40mg )   -prednisone taper to 10mg  tomorrow then off   -  lyrica  150mg  tid   - pamelor  50mg  qhs    4. Mood/Behavior/Sleep: LCSW to evaluate and provide emotional support             -antipsychotic agents: n/a   -appreciate psychiatry consult   -cymbalta increased to 40mg  daily   -pamelor 50mg    -outpt f/u 5. Neuropsych/cognition: This patient is capable of making decisions on  his own behalf.             -SLP  eval   6. Skin/Wound Care: Routine skin care checks             -left forearm woundVAC per plastic surgery--vac change/myriad Thurs   7. Fluids/Electrolytes/Nutrition: Strict Is and Os  and follow-up chemistries             -renal diet with fluid restriction             -hypoalbuminemia: continue protein supplements             -continue thiamine, folate supplementation   8: Polysubstance abuse: cessation counseling   9: Left forearm compartment syndrome status post fasciotomies 12/21 Dr. Fredna Dow             -wound management per plastic surgery. Myriad sheet placement yesterday             -carpal tunnel release left hand also has been performed             -continue ROM exercises, splint, limit weight bearing to 10 lbs.   10: Acute kidney injury: nephrology following; follow BMP   1/10 Cr falling nicely. Down to 2.79   1/11 RIJ removed yesterday  1/15 labs continue to improve. Dc'ws ivf -encourage appropriate po intake  11: Rhabdomyolysis: CK down to 906   12: Shock liver/elevated LFTs,     -WNL 1/10 13: Left gluteal myositis, ongoing pain: Continue AFO for foot drop             -outpatient NCV/EMG to assess for potential sciatic nv injury             -ROM without restrictions   -see pain mgt above 14: Hyperphosphatemia   15: Anemia,acute blood loss/hemodilution/HD, s/p 1 unit PCs: follow-up CBC   -1/10 hgb down to 7.1--no gross blood loss  -f/u Monday 16: Hypertension: monitor TID and prn             -continue Norvasc 5 mg daily             -hydralazine 25 mg TID    Vitals:   04/15/22 1806 04/16/22 0307  BP: (!) 153/96 131/85  Pulse: (!) 103 95  Resp: 18 20  Temp: 98 F (36.7 C) 97.6 F (36.4 C)  SpO2: 100% 100%     1/15 fair control 17.  Opioid induced constipation schedule Miralax BID  1/15 repeatws sorbitol 60 cc, SSE--small bm  1/17 results with SMOG enema 1/16  LOS: 8 days A FACE TO FACE EVALUATION WAS PERFORMED  Meredith Staggers 04/16/2022, 8:23 AM

## 2022-04-16 NOTE — Progress Notes (Signed)
Physical Therapy Session Note  Patient Details  Name: [name] [name] MRN: [id number] Date of Birth: [birth year]  Today's Date: 04/16/2022 PT Individual Time: 6433-2951 PT Individual Time Calculation (min): 55 min   Short Term Goals: Week 1:  PT Short Term Goal 1 (Week 1): Pt will perform bed mobility with CGA PT Short Term Goal 2 (Week 1): Pt will transfer to [facility] with CGA PT Short Term Goal 3 (Week 1): Pt will ambulate 52ft with min assist and LRAD PT Short Term Goal 4 (Week 1): Pt will propell WC >150ft with supervision assist using hemi technique  Skilled Therapeutic Interventions/Progress Updates:    Chart reviewed and pt agreeable to therapy. Pt received semi-reclined in bed with 10/10 c/o pain in LLE. Pt stated that during PT session on 1/16, he felt a sharp pn in L hip and that pn had increased since. He stated he did not feel capable of WBing on LLE. Pt frequently winced and cried out in pn while moving LLE. Pt described pn with presentation that was semi-consistent with sciatic nerve pn. Pt described pn as traveling down the back of his leg from his hip. Pt also stated when trying to straighten the leg in a seated position, that the pn was felt in the calf up to mid-thigh. PT asked pt if he had any previous back injuries, which pt denied. Pt then stated he felt some pn in the center of the low back and pointed to the lower lumbar region. Pt also stated some pn was traveling medially towards inner thigh and higher. Pt then stated the pn "wraps around" to R torso. PT explained back pt's description of pn for confirmation.  Session focused on functional transfers and BLE strengthening to promote independent mobility in setting of high pain. Pt transferred to EOB with ModI using bedrails. Pt then completed sit to stand with MinA and RUE support. Pt required 2 stands to tolerate standing position before pivoting into WC with MinA. Pt then completed 3 sit to stand with holds in standing  using MinA + RUE using bedrail. Pt attempted hemi-walker, but stated in was causing his LLE to twist under him and result in pain. Pt stood >30 secs with each stand with progressive WBing of LLE using CGA for balance. Pt stated pn increasing with each stand. Pt then completed seated AAROM with 1x5 LAQ but missing approximately 15 degrees of end range of motion for LLE. Pt assisted in SAQ position from seated and pt able to completed full extension for LLE for 1x5 SAQ, however pt noted to use hip thrust to assist reaching full extension. Pt made comfortable in WC.  At end of session, pt was left seated in [facility] with alarm engaged, nurse call bell and all needs in reach.     Therapy Documentation Precautions:  Precautions Precautions: Fall Precaution Comments: L forearm in dressing from fasciotomies and closure; TDC R IJ 1/2; 10 # weight bearing limitation per Dr Fredna Dow 1/3, no ROM restrictions Restrictions Weight Bearing Restrictions: Yes LUE Weight Bearing: Partial weight bearing LLE Weight Bearing: Partial weight bearing LLE Partial Weight Bearing Percentage or Pounds: no Other Position/Activity Restrictions: limit to 10# through LUE    Therapy/Group: Individual Therapy  Marquette Old, PT, DPT 04/16/2022, 11:53 AM

## 2022-04-16 NOTE — Progress Notes (Signed)
Speech Language Pathology Weekly Progress and Session Note  Patient Details  Name: Aseel Uhde MRN: [id number] Date of Birth: [birth year]  Beginning of progress report period: April 09, 2022 End of progress report period: April 16, 2022  Today's Date: 04/16/2022 SLP Individual Time: 1000-1027 SLP Individual Time Calculation (min): 27 min  Short Term Goals: Week 1: SLP Short Term Goal 1 (Week 1): Pt will complete additional cognitive-linguistic assessment to  further inform ST POC with 100% completion. SLP Short Term Goal 1 - Progress (Week 1): Met SLP Short Term Goal 2 (Week 1): Pt will participate in skilled observation of current diet textures with Sup A for implementation of aspiration precautions. SLP Short Term Goal 2 - Progress (Week 1): Met SLP Short Term Goal 3 (Week 1): Pt will demonstrate improved sustained attention to functional task for 10 minutes with Sup A. SLP Short Term Goal 3 - Progress (Week 1): Met SLP Short Term Goal 4 (Week 1): Pt will name 2 current physical and 2 cognitive changes s/p hospitalization and how this will impact his stay and d/c with Min A. SLP Short Term Goal 4 - Progress (Week 1): Met    New Short Term Goals: Week 2: SLP Short Term Goal 1 (Week 2): STGs=LTGs due to ELOS  Weekly Progress Updates: Patient has made functional gains and has met 4 of 4 STGs this reporting period. Currently, patient is consuming regular textures with thin liquids without overt s/s of aspiration and is overall mod I for use of swallowing compensatory strategies. Patient also demonstrates improved cognitive functioning and requires overall supervision-Min A verbal cues to complete functional and familiar tasks safely in regards to attention, problem solving, recall and awareness. Overall function and progress and fluctuate at times depending on pain. Patient and family education ongoing. Patient would benefit from continued skilled SLP intervention to maximize  his cognitive functioning and overall functional independence prior to discharge.      Intensity: Minumum of 1-2 x/day, 30 to 90 minutes Frequency: 3 to 5 out of 7 days Duration/Length of Stay: 04/21/22 Treatment/Interventions: Dysphagia/aspiration precaution training;Internal/external aids;Cognitive remediation/compensation;Cueing hierarchy;Environmental controls;Therapeutic Activities;Functional tasks;Patient/family education   Daily Session  Skilled Therapeutic Interventions:  Skilled treatment session focused on cognitive goals. Upon arrival, patient was upright in the wheelchair and was mildly distracted by pain. SLP facilitated session by providing Min verbal cues for sustained attention and for recall regarding the functions of his current medications. Patient demonstrated appropriate anticipatory awareness regarding medication management at home by reporting potentially needing assistance at home from family and utilizing a pill organizer. Patient agreeable to practicing organizing a BID pill box during next session. Patient left upright in the wheelchair with alarm on and all needs within reach. Continue with current plan of care.     Pain Pain Assessment Pain Scale: 0-10 Pain Score: 10-Worst pain ever Pain Type: Acute pain Pain Location: Leg Pain Orientation: Left Pain Descriptors / Indicators: Aching Pain Frequency: Constant Pain Onset: On-going Pain Intervention(s): Medication (See eMAR)  Therapy/Group: Individual Therapy  Cartez Mogle, [place] 04/16/2022, 1:41 PM

## 2022-04-16 NOTE — Progress Notes (Signed)
Occupational Therapy Weekly Progress Note  Patient Details  Name: [name] [name] MRN: [id number] Date of Birth: 26-Feb-1981  Beginning of progress report period: April 09, 2022 End of progress report period: April 16, 2022  Today's Date: 04/16/2022 OT Individual Time: 0920-1000 OT Individual Time Calculation (min): 40 min   Today's Date: 04/16/2022 OT Individual Time: 1350-1430 OT Individual Time Calculation (min): 40 min   Patient has met 4 of 4 short term goals.  Pt has made steady progress towards OT goals. Pt currently is at a CGA-MIN A level for BADLs and transfers with the exception of MOD A for footwear d/t sensitivity with LLE. Pt is very self limiting and resistive to trying adaptive strategies/safety techniques which is limiting progress with self care skills. Family education is needed and scheduled for the next reporting period in preparation for DC home.  Patient continues to demonstrate the following deficits: muscle weakness, decreased cardiorespiratoy endurance, impaired timing and sequencing, abnormal tone, and unbalanced muscle activation, decreased safety awareness and decreased memory, and decreased standing balance, decreased postural control, and decreased balance strategies and therefore will continue to benefit from skilled OT intervention to enhance overall performance with BADL and iADL.  Patient progressing toward long term goals..  Continue plan of care.  OT Short Term Goals Week 1:  OT Short Term Goal 1 (Week 1): Pt will transfer to toilet wiht LRAD and CGA OT Short Term Goal 1 - Progress (Week 1): Met OT Short Term Goal 2 (Week 1): Pt will compelte 2/3 steps of LB dressing OT Short Term Goal 2 - Progress (Week 1): Met OT Short Term Goal 3 (Week 1): Pt will don shirt wiht MIN A OT Short Term Goal 3 - Progress (Week 1): Met OT Short Term Goal 4 (Week 1): Pt will recall hemi dressing techniqes wiht MIN question cues OT Short Term Goal 4 - Progress  (Week 1): Met  Skilled Therapeutic Interventions/Progress Updates:    Session 1: Pt received in bed with unrated LLE pain premedicated and rest/repositioning provided for pain relief  ADL: Pt completes ADL at overall CGA Level. Skilled interventions include: education on adaptive techniques for dressing, cuing for w/c parts management for safety, cuing for stabilizing hips on sink for standing balance on RLE while advancing pants past hips, trial of trash can for footwaer for LLE. Increased time and encouragement to try strategies first before assisting   Pt left at end of session in w/c with exit alarm on, call light in reach and all needs met  Session 2: Pt received in bed with unrated LLE pain. Rest and repositioning provided.  Therapeutic exercise 8x 20 kinetron reciprocal movement training with focus on WB through BLE and pt able to work up to 10cm/second with no increase in pain with croc on. Seated rest breaks provided. Therapeutic activity Stand pivot transfers with TDWB on LLE with use of bed rail for closed chain transfers with supervision. Cuing for scooting to EOB prior to sit to stand for power up.  Pt left at end of session in bed with exit alarm on, call light in reach and all needs met   Therapy Documentation Precautions:  Precautions Precautions: Fall Precaution Comments: L forearm in dressing from fasciotomies and closure; TDC R IJ 1/2; 10 # weight bearing limitation per Dr Fredna Dow 1/3, no ROM restrictions Restrictions Weight Bearing Restrictions: Yes LUE Weight Bearing: Partial weight bearing LLE Weight Bearing: Partial weight bearing LLE Partial Weight Bearing Percentage or Pounds: no Other Position/Activity  Restrictions: limit to 10# through LUE General:    Therapy/Group: Individual Therapy  [name] 04/16/2022, 6:51 AM

## 2022-04-16 NOTE — Progress Notes (Signed)
Physical Therapy Session Note  Patient Details  Name: [name] [name] MRN: [id number] Date of Birth: 05-Apr-1980  Today's Date: 04/16/2022 PT Individual Time: 4665-9935 PT Individual Time Calculation (min): 48 min   Short Term Goals: Week 1:  PT Short Term Goal 1 (Week 1): Pt will perform bed mobility with CGA PT Short Term Goal 2 (Week 1): Pt will transfer to [facility] with CGA PT Short Term Goal 3 (Week 1): Pt will ambulate 12ft with min assist and LRAD PT Short Term Goal 4 (Week 1): Pt will propell WC >172ft with supervision assist using hemi technique  Skilled Therapeutic Interventions/Progress Updates:    Pt received sideways in bed, reclined with feet hanging off EOB and L foot placed on pillows stating he goes from this position to lying supine long ways (normal direction) in the bed because he cannot stay in one position for too long otherwise he has increased L LE pain. Pt eager to participate in therapy session, but is concerned about his current L LE pain levels and reports that he has lost strength in that LE since being on acute care.   Pt describes his pain consistently with how it is documented in [place] PT note from earlier this AM (please reference that). Pt states that he used to sit reclined on EOB as described above and perform L LE long arc quads easily with no pain and now pt is unable to perform terminal L knee extension ROM without severe pain. Therapist educated pt to avoid performing that movement and avoid instigating those tingling/shooting/shocking/electric pain sensations. Per chart review, pt with gluteal myositis with sciatic nerve compression and MD recommending massage and manual feedback. Therapist educated pt on nerve gliding and how currently with the sciatic nerve compression, it is not able to move and extend fully to perform that movement.  Pt transitioned to R sidelying in bed with pt requiring assistance to reposition L LE due to lack of active  hip external rotation.  Therapist performed 2minutes soft tissue mobilization to L gluteal and L lateral upper thigh regions with trigger point release along piriformis and proximal vastus lateralis. Pt reports improvement in symptoms immediately following; however, when pt went to transition to sitting EOB he quickly rolled over lacking active hip external rotation and hip abduction strength to control the movement causing a quick pull/stretch on those muscles re-instigating his pain. Therapist educated pt on reason for this pain and how to perform movements differently to avoid this in the future.  Therapist also educated pt on performing gentle L ankle DF stretch by sitting EOB and sliding his foot backwards as well as importance of having tennis shoes to allow trial of AFOs (rather than just toe-up brace) to determine most appropriate orthosis.  Pt left seated EOB with needs in reach, bed alarm on, lines intact, and meal tray set-up.  Therapy Documentation Precautions:  Precautions Precautions: Fall Precaution Comments: L forearm in dressing from fasciotomies and closure; TDC R IJ 1/2; 10 # weight bearing limitation per Dr Fredna Dow 1/3, no ROM restrictions Restrictions Weight Bearing Restrictions: Yes LUE Weight Bearing: Partial weight bearing LLE Weight Bearing: Partial weight bearing LLE Partial Weight Bearing Percentage or Pounds: no Other Position/Activity Restrictions: limit to 10# through LUE   Pain:  L LE pain - performed manual therapy for pain management.    Therapy/Group: Individual Therapy  Tawana Scale , PT, DPT, NCS, CSRS 04/16/2022, 3:24 PM

## 2022-04-16 NOTE — Progress Notes (Signed)
Patient ID: [name] [name], male   DOB: 06-19-1980, 42 y.o.   MRN: [id number]  *P2P completed and pt authorized through 1/21.   SW informed plastics on challenges with obtaining HHA. SW waiting on updates if able to manage wound vac in clinic. *plastics can manage wound vac in clinic if unable to find a HHA. SW informed will update.   SW called [facility] Intake [phone]) to discuss referral. Reports no staffing in area.   SW spoke with Nikki/[facility] [phone]) to discuss referral. Reports not servicing patients in area at this time.   SW ordered TTB and DABSC with [facility] via parachute.   SW left message for Rocky/[facility] to discuss referral.  *SW spoke with [place] and not servicing [place] at this time.   Karen/[facility] Int[facility] with [facility] (N:[phone]/S:[phone])- does not service [place].   *Pt will not have [place] therapies. SW informed plastics on challenge. Wound vac will be changed in their clinic.   Declined HHAs [name]- due to nursing [name][facility] - OON with insurance [name][name]- does not cover zipcode [name]/[facility] [name]/[facility]- not in contract with Medicaid [place] and [place] and [place], [place], [place] Office: [phone] Cell: [phone] Fax: [phone]

## 2022-04-17 ENCOUNTER — Other Ambulatory Visit: Payer: Self-pay

## 2022-04-17 ENCOUNTER — Inpatient Hospital Stay (HOSPITAL_COMMUNITY): Payer: Medicaid Other | Admitting: Anesthesiology

## 2022-04-17 ENCOUNTER — Encounter (HOSPITAL_COMMUNITY)
Admission: RE | Disposition: A | Discharge status: Home / Self Care | Payer: Self-pay | Source: Intra-hospital | Attending: Physical Medicine & Rehabilitation

## 2022-04-17 ENCOUNTER — Encounter (HOSPITAL_COMMUNITY): Payer: Self-pay | Admitting: Physical Medicine & Rehabilitation

## 2022-04-17 DIAGNOSIS — F17210 Nicotine dependence, cigarettes, uncomplicated: Secondary | ICD-10-CM

## 2022-04-17 DIAGNOSIS — M1990 Unspecified osteoarthritis, unspecified site: Secondary | ICD-10-CM

## 2022-04-17 DIAGNOSIS — S41102A Unspecified open wound of left upper arm, initial encounter: Secondary | ICD-10-CM

## 2022-04-17 DIAGNOSIS — N289 Disorder of kidney and ureter, unspecified: Secondary | ICD-10-CM

## 2022-04-17 HISTORY — PX: APPLICATION OF WOUND VAC: SHX5189

## 2022-04-17 HISTORY — PX: APPLICATION OF A-CELL OF EXTREMITY: SHX6303

## 2022-04-17 SURGERY — APPLICATION, SKIN SUBSTITUTE
Anesthesia: General | Site: Arm Lower | Laterality: Left

## 2022-04-17 MED ORDER — LACTATED RINGERS IV SOLN: INTRAVENOUS | Status: DC

## 2022-04-17 MED ORDER — MIDAZOLAM HCL 2 MG/2ML IJ SOLN
INTRAMUSCULAR | Status: DC | PRN
  Administered 2022-04-17: 2 mg via INTRAVENOUS

## 2022-04-17 MED ORDER — GENTAMICIN SULFATE 40 MG/ML IJ SOLN
5.0000 mg/kg | INTRAVENOUS | Status: AC
  Administered 2022-04-17: 490 mg via INTRAVENOUS
  Filled 2022-04-17 (×2): qty 12.25

## 2022-04-17 MED ORDER — LIDOCAINE 2% (20 MG/ML) 5 ML SYRINGE
INTRAMUSCULAR | Status: DC | PRN
  Administered 2022-04-17: 60 mg via INTRAVENOUS

## 2022-04-17 MED ORDER — HYDROMORPHONE HCL 1 MG/ML IJ SOLN: 0.2500 mg | INTRAMUSCULAR | Status: DC | PRN

## 2022-04-17 MED ORDER — FENTANYL CITRATE (PF) 250 MCG/5ML IJ SOLN
INTRAMUSCULAR | Status: DC | PRN
  Administered 2022-04-17: 100 ug via INTRAVENOUS

## 2022-04-17 MED ORDER — PROPOFOL 10 MG/ML IV BOLUS
INTRAVENOUS | Status: AC
  Filled 2022-04-17: qty 20

## 2022-04-17 MED ORDER — 0.9 % SODIUM CHLORIDE (POUR BTL) OPTIME
TOPICAL | Status: DC | PRN
  Administered 2022-04-17: 1000 mL

## 2022-04-17 MED ORDER — CLINDAMYCIN PHOSPHATE 900 MG/50ML IV SOLN
900.0000 mg | INTRAVENOUS | Status: AC
  Administered 2022-04-17: 900 mg via INTRAVENOUS
  Filled 2022-04-17 (×2): qty 50

## 2022-04-17 MED ORDER — ACETAMINOPHEN 500 MG PO TABS
1000.0000 mg | ORAL_TABLET | Freq: Once | ORAL | Status: AC
  Administered 2022-04-17: 1000 mg via ORAL
  Filled 2022-04-17: qty 2

## 2022-04-17 MED ORDER — MIDAZOLAM HCL 2 MG/2ML IJ SOLN
INTRAMUSCULAR | Status: AC
  Filled 2022-04-17: qty 2

## 2022-04-17 MED ORDER — FENTANYL CITRATE (PF) 250 MCG/5ML IJ SOLN
INTRAMUSCULAR | Status: AC
  Filled 2022-04-17: qty 5

## 2022-04-17 MED ORDER — LIDOCAINE-EPINEPHRINE 1 %-1:100000 IJ SOLN: INTRAMUSCULAR | Status: DC | PRN

## 2022-04-17 MED ORDER — LIDOCAINE-EPINEPHRINE 1 %-1:100000 IJ SOLN
INTRAMUSCULAR | Status: AC
  Filled 2022-04-17: qty 1

## 2022-04-17 MED ORDER — ORAL CARE MOUTH RINSE: 15.0000 mL | Freq: Once | OROMUCOSAL | Status: AC

## 2022-04-17 MED ORDER — CHLORHEXIDINE GLUCONATE 0.12 % MT SOLN
15.0000 mL | Freq: Once | OROMUCOSAL | Status: AC
  Administered 2022-04-17: 15 mL via OROMUCOSAL

## 2022-04-17 MED ORDER — DEXAMETHASONE SODIUM PHOSPHATE 10 MG/ML IJ SOLN
INTRAMUSCULAR | Status: DC | PRN
  Administered 2022-04-17: 5 mg via INTRAVENOUS

## 2022-04-17 MED ORDER — PROPOFOL 10 MG/ML IV BOLUS
INTRAVENOUS | Status: DC | PRN
  Administered 2022-04-17: 200 mg via INTRAVENOUS

## 2022-04-17 MED ORDER — ONDANSETRON HCL 4 MG/2ML IJ SOLN
INTRAMUSCULAR | Status: DC | PRN
  Administered 2022-04-17: 4 mg via INTRAVENOUS

## 2022-04-17 SURGICAL SUPPLY — 41 items
BNDG CMPR 5X62 HK CLSR LF (GAUZE/BANDAGES/DRESSINGS) ×1
BNDG ELASTIC 4X5.8 VLCR STR LF (GAUZE/BANDAGES/DRESSINGS) IMPLANT
BNDG ELASTIC 6INX 5YD STR LF (GAUZE/BANDAGES/DRESSINGS) IMPLANT
BNDG GAUZE DERMACEA FLUFF 4 (GAUZE/BANDAGES/DRESSINGS) IMPLANT
BNDG GZE DERMACEA 4 6PLY (GAUZE/BANDAGES/DRESSINGS) ×2
CANISTER SUCT 3000ML PPV (MISCELLANEOUS) ×1 IMPLANT
CANISTER WOUND CARE 500ML ATS (WOUND CARE) IMPLANT
COVER SURGICAL LIGHT HANDLE (MISCELLANEOUS) ×1 IMPLANT
DRAPE HALF SHEET 40X57 (DRAPES) IMPLANT
DRAPE INCISE IOBAN 66X45 STRL (DRAPES) IMPLANT
DRSG CUTIMED SORBACT 7X9 (GAUZE/BANDAGES/DRESSINGS) IMPLANT
DRSG VAC GRANUFOAM LG (GAUZE/BANDAGES/DRESSINGS) IMPLANT
ELECT REM PT RETURN 9FT ADLT (ELECTROSURGICAL) ×1
ELECTRODE REM PT RTRN 9FT ADLT (ELECTROSURGICAL) ×1 IMPLANT
GAUZE XEROFORM 5X9 LF (GAUZE/BANDAGES/DRESSINGS) IMPLANT
GLOVE BIO SURGEON STRL SZ 6.5 (GLOVE) ×1 IMPLANT
GLOVE BIOGEL M 6.5 STRL (GLOVE) ×1 IMPLANT
GOWN STRL REUS W/ TWL LRG LVL3 (GOWN DISPOSABLE) ×3 IMPLANT
GOWN STRL REUS W/TWL LRG LVL3 (GOWN DISPOSABLE) ×3
GRAFT MYRIAD 3 LAYER 10X20 (Graft) IMPLANT
KIT BASIN OR (CUSTOM PROCEDURE TRAY) ×1 IMPLANT
KIT TURNOVER KIT B (KITS) ×1 IMPLANT
MARKER SKIN DUAL TIP RULER LAB (MISCELLANEOUS) IMPLANT
NDL HYPO 25GX1X1/2 BEV (NEEDLE) ×1 IMPLANT
NEEDLE HYPO 25GX1X1/2 BEV (NEEDLE) ×1 IMPLANT
NS IRRIG 1000ML POUR BTL (IV SOLUTION) ×1 IMPLANT
PACK GENERAL/GYN (CUSTOM PROCEDURE TRAY) ×1 IMPLANT
PACK UNIVERSAL I (CUSTOM PROCEDURE TRAY) ×1 IMPLANT
PAD ARMBOARD 7.5X6 YLW CONV (MISCELLANEOUS) ×2 IMPLANT
SURGILUBE 2OZ TUBE FLIPTOP (MISCELLANEOUS) IMPLANT
SUT MNCRL AB 3-0 PS2 27 (SUTURE) IMPLANT
SUT MNCRL AB 4-0 PS2 18 (SUTURE) IMPLANT
SUT MON AB 2-0 CT1 36 (SUTURE) IMPLANT
SUT MON AB 5-0 PS2 18 (SUTURE) IMPLANT
SUT VIC AB 5-0 PS2 18 (SUTURE) IMPLANT
SUT VICRYL 3 0 (SUTURE) IMPLANT
SWAB COLLECTION DEVICE MRSA (MISCELLANEOUS) IMPLANT
SWAB CULTURE ESWAB REG 1ML (MISCELLANEOUS) IMPLANT
SYR CONTROL 10ML LL (SYRINGE) ×1 IMPLANT
TOWEL GREEN STERILE (TOWEL DISPOSABLE) ×1 IMPLANT
UNDERPAD 30X36 HEAVY ABSORB (UNDERPADS AND DIAPERS) ×1 IMPLANT

## 2022-04-17 NOTE — Progress Notes (Addendum)
Physical Therapy Session Note  Patient Details  Name: [name] [name] MRN: [id number] Date of Birth: December 23, 1980  Today's Date: 04/17/2022 PT Individual Time: 1430-1445 PT Individual Time Calculation (min): 15 min   Short Term Goals: Week 1:  PT Short Term Goal 1 (Week 1): Pt will perform bed mobility with CGA PT Short Term Goal 2 (Week 1): Pt will transfer to [facility] with CGA PT Short Term Goal 3 (Week 1): Pt will ambulate 16ft with min assist and LRAD PT Short Term Goal 4 (Week 1): Pt will propell WC >170ft with supervision assist using hemi technique  Skilled Therapeutic Interventions/Progress Updates: Attempted to treat pt in AM, but off unit for wound vac change.  Returned in PM and treated x 15' for some make-up time.  Pt transfers sit to stand w/ min A at hi-lo table and extending L knee but noted hip flexion to accomplish.  Pt able to place L foot flat.  Pt wheeled from dayroom x 30' w/ BUES but c/o increased pain to L hip/leg and PT completed.  Pt performed sit to stand and then SPT w/c > bed w min A.  Pt reclining in bed across bed w/ pillows propping up on opposite hand rail.  Bed alarm on and all needs in reach.  Missed time 45 min 2/2 procedure.     Therapy Documentation Precautions:  Precautions Precautions: Fall Precaution Comments: L forearm in dressing from fasciotomies and closure; TDC R IJ 1/2; 10 # weight bearing limitation per Dr Fredna Dow 1/3, no ROM restrictions Restrictions Weight Bearing Restrictions: Yes LUE Weight Bearing: Partial weight bearing LLE Weight Bearing: Partial weight bearing LLE Partial Weight Bearing Percentage or Pounds: no Other Position/Activity Restrictions: limit to 10# through LUE General: PT Amount of Missed Time (min): 45 Minutes PT Missed Treatment Reason: Wound care (wound vac replacement.) Vital Signs: Therapy Vitals Temp: 98 F (36.7 C) Temp Source: Oral Pulse Rate: 89 Resp: 19 BP: 115/71 Patient Position (if appropriate):  Sitting Pain:10/10 Pain Assessment Pain Score: 0-No pain Mobility:      Therapy/Group: Individual Therapy  [name] 04/17/2022, 2:49 PM

## 2022-04-17 NOTE — Anesthesia Preprocedure Evaluation (Addendum)
Anesthesia Evaluation  Patient identified by MRN, date of birth, ID band Patient awake    Reviewed: Allergy & Precautions, H&P , NPO status , Patient's Chart, lab work & pertinent test results  Airway Mallampati: II  TM Distance: >3 FB Neck ROM: Full    Dental no notable dental hx. (+) Teeth Intact, Dental Advisory Given   Pulmonary Current Smoker and Patient abstained from smoking.   Pulmonary exam normal breath sounds clear to auscultation       Cardiovascular negative cardio ROS  Rhythm:Regular Rate:Normal     Neuro/Psych  negative psych ROS   GI/Hepatic negative GI ROS,,,(+)     substance abuse    Endo/Other  negative endocrine ROS    Renal/GU Renal InsufficiencyRenal disease  negative genitourinary   Musculoskeletal  (+) Arthritis , Osteoarthritis,    Abdominal   Peds  Hematology negative hematology ROS (+)   Anesthesia Other Findings   Reproductive/Obstetrics negative OB ROS                             Anesthesia Physical Anesthesia Plan  ASA: 3  Anesthesia Plan: General   Post-op Pain Management: Tylenol PO (pre-op)*, Precedex and Ketamine IV*   Induction: Intravenous  PONV Risk Score and Plan: 2 and Ondansetron, Dexamethasone and Midazolam  Airway Management Planned: LMA  Additional Equipment:   Intra-op Plan:   Post-operative Plan: Extubation in OR  Informed Consent: I have reviewed the patients History and Physical, chart, labs and discussed the procedure including the risks, benefits and alternatives for the proposed anesthesia with the patient or authorized representative who has indicated his/her understanding and acceptance.     Dental advisory given  Plan Discussed with: CRNA  Anesthesia Plan Comments:        Anesthesia Quick Evaluation

## 2022-04-17 NOTE — Progress Notes (Signed)
Patient ID: [name] [name], male   DOB: 09-16-1980, 42 y.o.   MRN: [id number]  SW spoke with Tracy/[id number] to discuss status of wound vac. States will need depth measurement. Otherwise, will see if wound vac can be released. SW informed medical team.  [name], MSW, [place] Office: [phone] Cell: [phone] Fax: [phone]

## 2022-04-17 NOTE — Progress Notes (Signed)
Occupational Therapy Session Note  Patient Details  Name: [name] [name] MRN: [id number] Date of Birth: [birth year]  Today's Date: 04/17/2022 OT Individual Time: 9562-1308 OT Individual Time Calculation (min): 53 min   Today's Date: 04/17/2022 OT Individual Time: 1350-1430 OT Individual Time Calculation (min): 40 min   Short Term Goals: Week 1:  OT Short Term Goal 1 (Week 1): Pt will transfer to toilet wiht LRAD and CGA OT Short Term Goal 1 - Progress (Week 1): Met OT Short Term Goal 2 (Week 1): Pt will compelte 2/3 steps of LB dressing OT Short Term Goal 2 - Progress (Week 1): Met OT Short Term Goal 3 (Week 1): Pt will don shirt wiht MIN A OT Short Term Goal 3 - Progress (Week 1): Met OT Short Term Goal 4 (Week 1): Pt will recall hemi dressing techniqes wiht MIN question cues OT Short Term Goal 4 - Progress (Week 1): Met  Skilled Therapeutic Interventions/Progress Updates:    Session 1: Pt received in bed with 8 out of 10 pain in LLE. Estim/ rest / repositioning provided for pain relief  ADL: Pt completes ADL at overall setup/supervision Level. Skilled interventions include: education on use of sock aide for footwear, supervision/cuing for use of bed rail/w/c parts for slosed chain transfer with supervision, cuing for foot placement for transitional movements. Pt dons gown with set up and completes oral care with set up seated. Pt reporting increased sensationin thigh being able to feel light touch.   Pt agreeable to NMES. Completed Estim of glute on large muscle atrophy setting. Pt tolerated well with skin in tact with muscle contraction noted.     Pt left at end of session in bed with exit alarm on, call light in reach and all needs met  Session 2: Pt received in bed with 8 out of 10 pain in LLE. Rest and repositioning provided for pain relief  Therapeutic exercise Putty exercises with level 3 red putty in RUE and red foam rectangle in LUE. No WB or ROM  restrictions with LUE. Pt with generalized weakness in L hand and provided with cuing/education for strength recovery.    Access Code: [email or web address] URL: [email or web address] Date: 04/17/2022 Prepared by: Mariane Masters  Exercises - Putty Squeezes  - 1 x daily - 7 x weekly - 3 sets - 10 reps - 3-Point Pinch with Putty  - 1 x daily - 7 x weekly - 3 sets - 10 reps - Finger Pinch and Pull with Putty  - 1 x daily - 7 x weekly - 3 sets - 10 reps - Finger Key Grip with Putty  - 1 x daily - 7 x weekly - 3 sets - 10 reps - Rolling Putty on Table  - 1 x daily - 7 x weekly - 3 sets - 10 reps - Thumb Opposition with Putty  - 1 x daily - 7 x weekly - 3 sets - 10 reps - Marble Pick Up from Putty  - 1 x daily - 7 x weekly - 3 sets - 10 reps  Pt left at end of session in w/c with direct handoff to [place] Documentation Precautions:  Precautions Precautions: Fall Precaution Comments: L forearm in dressing from fasciotomies and closure; TDC R IJ 1/2; 10 # weight bearing limitation per Dr Fredna Dow 1/3, no ROM restrictions Restrictions Weight Bearing Restrictions: Yes LUE Weight Bearing: Partial weight bearing LLE Weight Bearing: Partial weight bearing LLE Partial Weight Bearing Percentage or Pounds: no Other  Position/Activity Restrictions: limit to 10# through LUE General:    Therapy/Group: Individual Therapy  [name] 04/17/2022, 6:58 AM

## 2022-04-17 NOTE — Progress Notes (Addendum)
  Subjective:  No complaints with LUE, he notes swelling has gone down   Objective: Vital signs in last 24 hours: Temp:  [97.6 F (36.4 C)-98.9 F (37.2 C)] 97.6 F (36.4 C) (01/18 0420) Pulse Rate:  [95-110] 95 (01/18 0420) Resp:  [16-18] 16 (01/18 0420) BP: (122-150)/(74-85) 137/85 (01/18 0603) SpO2:  [99 %] 99 % (01/18 0420) Last BM Date : 04/16/22   Physical Exam:  General: Pt resting comfortably in no acute distress LUE wrapped, full active ROM of hand, no significant swelling of fingers or remaining upper extremity    Assessment/Plan: s/p Procedure(s): left arm wound preparation for placement of myriad, vac change APPLICATION OF WOUND VAC   OR tomorrow for possible closure and wound vac change  NPO after midnight    [employer], PA-C 04/17/2022

## 2022-04-17 NOTE — Progress Notes (Signed)
Patient returned from procedure with MD (wound debridement) safely/no issues. Morning medications given per PA.   Yehuda Mao, LPN

## 2022-04-17 NOTE — Transfer of Care (Signed)
Immediate Anesthesia Transfer of Care Note  Patient: [name] [name]  Procedure(s) Performed: left arm wound debridement (Left: Arm Lower) WOUND VAC CHANGE (Left: Arm Lower) APPLICATION OF MYRIAD OF EXTREMITY (Left: Arm Lower)  Patient Location: PACU  Anesthesia Type:General  Level of Consciousness: drowsy  Airway & Oxygen Therapy: Patient Spontanous Breathing  Post-op Assessment: Report given to RN  Post vital signs: Reviewed and stable  Last Vitals:  Vitals Value Taken Time  BP 113/80 04/17/22 0922  Temp    Pulse 101 04/17/22 0923  Resp 5 04/17/22 0923  SpO2 100 % 04/17/22 0923  Vitals shown include unvalidated device data.  Last Pain:  Vitals:   04/17/22 0806  TempSrc: Oral  PainSc: 10-Worst pain ever      Patients Stated Pain Goal: 3 (90/22/84 0698)  Complications: No notable events documented.

## 2022-04-17 NOTE — H&P (View-Only) (Signed)
  Subjective:  No complaints with LUE, he notes swelling has gone down   Objective: Vital signs in last 24 hours: Temp:  [97.6 F (36.4 C)-98.9 F (37.2 C)] 97.6 F (36.4 C) (01/18 0420) Pulse Rate:  [95-110] 95 (01/18 0420) Resp:  [16-18] 16 (01/18 0420) BP: (122-150)/(74-85) 137/85 (01/18 0603) SpO2:  [99 %] 99 % (01/18 0420) Last BM Date : 04/16/22   Physical Exam:  General: Pt resting comfortably in no acute distress LUE wrapped, full active ROM of hand, no significant swelling of fingers or remaining upper extremity    Assessment/Plan: s/p Procedure(s): left arm wound preparation for placement of myriad, vac change APPLICATION OF WOUND VAC   OR tomorrow for possible closure and wound vac change  NPO after midnight    [employer], PA-C 04/17/2022

## 2022-04-17 NOTE — Anesthesia Procedure Notes (Addendum)
Procedure Name: LMA Insertion Date/Time: 04/17/2022 8:49 AM  Performed by: Barrington Ellison, CRNAPre-anesthesia Checklist: Patient identified, Emergency Drugs available, Suction available and Patient being monitored Patient Re-evaluated:Patient Re-evaluated prior to induction Oxygen Delivery Method: Circle System Utilized Preoxygenation: Pre-oxygenation with 100% oxygen Induction Type: IV induction Ventilation: Mask ventilation without difficulty LMA: LMA inserted LMA Size: 5.0 Number of attempts: 1 Airway Equipment and Method: Bite block Placement Confirmation: positive ETCO2 Tube secured with: Tape Dental Injury: Teeth and Oropharynx as per pre-operative assessment and Injury to tongue  Comments: Possible small laceration to frenulum, Recommend LMA 4 for future LMA use.

## 2022-04-17 NOTE — Anesthesia Postprocedure Evaluation (Signed)
Anesthesia Post Note  Patient: [name] [name]  Procedure(s) Performed: left arm wound debridement (Left: Arm Lower) WOUND VAC CHANGE (Left: Arm Lower) APPLICATION OF MYRIAD OF EXTREMITY (Left: Arm Lower)     Patient location during evaluation: PACU Anesthesia Type: General Level of consciousness: awake and alert Pain management: pain level controlled Vital Signs Assessment: post-procedure vital signs reviewed and stable Respiratory status: spontaneous breathing, nonlabored ventilation and respiratory function stable Cardiovascular status: blood pressure returned to baseline and stable Postop Assessment: no apparent nausea or vomiting Anesthetic complications: no  No notable events documented.  Last Vitals:  Vitals:   04/17/22 0930 04/17/22 0945  BP: 112/77 108/78  Pulse: 95 90  Resp: (!) 5 10  Temp:  36.6 C  SpO2: 99% 100%    Last Pain:  Vitals:   04/17/22 0945  TempSrc:   PainSc: 0-No pain                 Teancum Brule,W. EDMOND

## 2022-04-17 NOTE — Interval H&P Note (Signed)
History and Physical Interval Note:  04/17/2022 8:11 AM  [name] [name]  has presented today for surgery, with the diagnosis of left dorsal fasciotomy wound and proximal and distal portion of the volar fasciotomy wound.  The various methods of treatment have been discussed with the patient and family. After consideration of risks, benefits and other options for treatment, the patient has consented to  Procedure(s): left arm wound preparation for placement of myriad, vac change (Left) APPLICATION OF WOUND VAC (Left) as a surgical intervention.  The patient's history has been reviewed, patient examined, no change in status, stable for surgery.  I have reviewed the patient's chart and labs.  Questions were answered to the patient's satisfaction.     Loel Lofty Kaari Zeigler

## 2022-04-17 NOTE — Op Note (Signed)
DATE OF OPERATION: 04/17/2022  LOCATION: [facility] Room  PREOPERATIVE DIAGNOSIS: left arm wound 6 x 20 cm  POSTOPERATIVE DIAGNOSIS: Same  PROCEDURE: preparation of left arm wound for placement of Myriad 10 x 20 cm sheet and VAC.  SURGEON: Atley Neubert Sanger Zaylie Gisler, DO  ASSISTANT: Roetta Sessions, PA  EBL: none  CONDITION: Stable  COMPLICATIONS: None  INDICATION: The patient, [name] [name], is a 42 y.o. male born on 04/02/1980, is here for treatment of the left arm wound after fasciotomies were done.   PROCEDURE DETAILS:  The patient was seen prior to surgery and marked.  The IV antibiotics were given. The patient was taken to the operating room and given a general anesthetic. A standard time out was performed and all information was confirmed by those in the room. SCDs were placed.   The left arm was prepped and draped.  The wound was healing very nicely and the swelling was markedly improved.  The wound was irrigated with saline.  All of the myriad was applied and secured with the 5-0 Vicryl.  The sorbact was placed and the VAC with KY gel.  There was an excellent seal.  The patient was allowed to wake up and taken to recovery room in stable condition at the end of the case. The family was notified at the end of the case.   The advanced practice practitioner (APP) assisted throughout the case.  The APP was essential in retraction and counter traction when needed to make the case progress smoothly.  This retraction and assistance made it possible to see the tissue plans for the procedure.  The assistance was needed for blood control, tissue re-approximation and assisted with closure of the incision site.

## 2022-04-17 NOTE — Progress Notes (Signed)
PROGRESS NOTE   Subjective/Complaints: Pt for vac change/myriad placement today. Still struggles with pain  ROS: Patient denies fever, rash, sore throat, blurred vision, dizziness, nausea, vomiting, diarrhea, cough, shortness of breath or chest pain, joint or back/neck pain, headache, or mood change.    Objective:   No results found. No results for input(s): "WBC", "HGB", "HCT", "PLT" in the last 72 hours.   No results for input(s): "NA", "K", "CL", "CO2", "GLUCOSE", "BUN", "CREATININE", "CALCIUM" in the last 72 hours.    Intake/Output Summary (Last 24 hours) at 04/17/2022 0853 Last data filed at 04/17/2022 0418 Gross per 24 hour  Intake 477 ml  Output 4360 ml  Net -3883 ml        Physical Exam: Vital Signs Blood pressure 130/83, pulse 93, temperature 98.1 F (36.7 C), temperature source Oral, resp. rate 17, height 5\' 7"  (1.702 m), weight 98.5 kg, SpO2 99 %.    Constitutional: No distress . Vital signs reviewed. HEENT: NCAT, EOMI, oral membranes moist Neck: supple Cardiovascular: RRR without murmur. No JVD    Respiratory/Chest: CTA Bilaterally without wheezes or rales. Normal effort    GI/Abdomen: BS +, non-tender, non-distended Ext: no clubbing, cyanosis, or edema Psych: pleasant and cooperative  Skin: tats, a few abrasions and healing wounds/dressed Neuro: Alert and oriented x 3. Normal insight and awareness. Intact Memory. Normal language and speech. Cranial nerve exam unremarkable  Moves all 4's. Unable to wiggle toes, adf/apf LLE--stable exam. Can wiggle fingers Left hand Musculoskeletal: left arm splinted. Left leg tender proximally from upper third of thigh with the most pain distally at foot.-has discomfort near left greater troch with palpation. No muscle spasms appreciated. Seems to tolerate tactile stimulation better today.   Assessment/Plan: 1. Functional deficits which require 3+ hours per day of  interdisciplinary therapy in a comprehensive inpatient rehab setting. Physiatrist is providing close team supervision and 24 hour management of active medical problems listed below. Physiatrist and rehab team continue to assess barriers to discharge/monitor patient progress toward functional and medical goals  Care Tool:  Bathing    Body parts bathed by patient: Left arm, Chest, Abdomen, Front perineal area, Buttocks, Right upper leg, Left upper leg   Body parts bathed by helper: Right arm, Right lower leg, Left lower leg     Bathing assist Assist Level: Moderate Assistance - Patient 50 - 74%     Upper Body Dressing/Undressing Upper body dressing   What is the patient wearing?: Pull over shirt    Upper body assist Assist Level: Maximal Assistance - Patient 25 - 49%    Lower Body Dressing/Undressing Lower body dressing      What is the patient wearing?: Pants     Lower body assist Assist for lower body dressing: Total Assistance - Patient < 25%     Toileting Toileting    Toileting assist Assist for toileting: Maximal Assistance - Patient 25 - 49%     Transfers Chair/bed transfer  Transfers assist     Chair/bed transfer assist level: Moderate Assistance - Patient 50 - 74%     Locomotion Ambulation   Ambulation assist      Assist level: Moderate Assistance - Patient  50 - 74% Assistive device: Parallel bars Max distance: 5   Walk 10 feet activity   Assist  Walk 10 feet activity did not occur: Safety/medical concerns        Walk 50 feet activity   Assist Walk 50 feet with 2 turns activity did not occur: Safety/medical concerns         Walk 150 feet activity   Assist Walk 150 feet activity did not occur: Safety/medical concerns         Walk 10 feet on uneven surface  activity   Assist Walk 10 feet on uneven surfaces activity did not occur: Safety/medical concerns         Wheelchair     Assist Is the patient using a  wheelchair?: Yes Type of Wheelchair: Manual    Wheelchair assist level: Minimal Assistance - Patient > 75% (min to CGA when using R foot on floor, pt received Crocs today.) Max wheelchair distance: 150    Wheelchair 50 feet with 2 turns activity    Assist        Assist Level: Minimal Assistance - Patient > 75%   Wheelchair 150 feet activity     Assist      Assist Level: Minimal Assistance - Patient > 75%   Blood pressure 130/83, pulse 93, temperature 98.1 F (36.7 C), temperature source Oral, resp. rate 17, height 5\' 7"  (1.702 m), weight 98.5 kg, SpO2 99 %.  Medical Problem List and Plan: 1. Functional deficits secondary to bilateral acute/subacute cerebellar infarcts; left forearm compartment syndrome             -patient may shower if left arm covered             -ELOS/Goals: 12-14 days, mod I to supervision goals             -no PRAFO LLE for now due pain/sensitivity  -Continue CIR therapies including PT, OT  2.  Antithrombotics: -DVT/anticoagulation:  Pharmaceutical: Heparin             -antiplatelet therapy: aspirin 81 mg daily   3. 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 Will see if PT can help him make some type of rubbing stick (using towel)   -continue cymbalta (now 40mg )   -prednisone taper to 10mg  tomorrow then off   -  lyrica  @ 150mg  tid   - pamelor  @ 50mg  qhs    4. Mood/Behavior/Sleep: LCSW to evaluate and provide emotional support             -antipsychotic agents: n/a   -appreciate psychiatry consult   -cymbalta increased to 40mg  daily   -pamelor 50mg    -outpt f/u 5. Neuropsych/cognition: This patient is capable of making decisions on  his own behalf.             -SLP eval   6. Skin/Wound Care:  Routine skin care checks             -left forearm woundVAC per plastic surgery--vac change/myriad today   7. Fluids/Electrolytes/Nutrition: Strict Is and Os and follow-up chemistries             -renal diet with fluid restriction             -  hypoalbuminemia: continue protein supplements             -continue thiamine, folate supplementation   8: Polysubstance abuse: cessation counseling   9: Left forearm compartment syndrome status post fasciotomies 12/21 Dr. Fredna Dow             -wound management per plastic surgery. Myriad sheet placement yesterday             -carpal tunnel release left hand also has been performed             -continue ROM exercises, splint, limit weight bearing to 10 lbs.   10: Acute kidney injury:   1/11 RIJ removed    1/15 Cr 1.43  1/18 continues to improve. Intake excellent 11: Rhabdomyolysis: CK down to 906   12: Shock liver/elevated LFTs,     -WNL 1/10 13: Left gluteal myositis, ongoing pain: Continue AFO for foot drop             -outpatient NCV/EMG to assess for potential sciatic nv injury             -ROM without restrictions   -see pain mgt above 14: Hyperphosphatemia   15: Anemia,acute blood loss/hemodilution/HD, s/p 1 unit PCs: follow-up CBC   -1/10 hgb down to 7.1--no gross blood loss  -f/u Monday 16: Hypertension: monitor TID and prn             -continue Norvasc 5 mg daily             -hydralazine 25 mg TID    Vitals:   04/17/22 0603 04/17/22 0806  BP: 137/85 130/83  Pulse:  93  Resp:  17  Temp:  98.1 F (36.7 C)  SpO2:  99%     1/18 fair control 17.  Opioid induced constipation  -scheduled Miralax BID, fiber  - results with SMOG enema 1/16  LOS: 9 days A FACE TO FACE EVALUATION WAS PERFORMED  Meredith Staggers 04/17/2022, 8:53 AM

## 2022-04-17 NOTE — Progress Notes (Signed)
Subjective: Day of Surgery Procedure(s) (LRB): left arm wound debridement (Left) WOUND VAC CHANGE (Left) APPLICATION OF MYRIAD OF EXTREMITY (Left) Patient reports overall happy with left arm.  Has been working on motion.  Minimal pain.  Most complaints regard left leg at this point.  Objective: Vital signs in last 24 hours: Temp:  [97.6 F (36.4 C)-98.9 F (37.2 C)] 97.6 F (36.4 C) (01/18 1018) Pulse Rate:  [90-106] 98 (01/18 1018) Resp:  [5-18] 18 (01/18 1018) BP: (108-150)/(74-91) 133/91 (01/18 1018) SpO2:  [99 %-100 %] 100 % (01/18 1018)  Intake/Output from previous day: 01/17 0701 - 01/18 0700 In: 477 [P.O.:477] Out: 4360 [Urine:4350; Drains:10] Intake/Output this shift: Total I/O In: 642.3 [P.O.:480; IV Piggyback:162.3] Out: 2 [Blood:2]  No results for input(s): "HGB" in the last 72 hours. No results for input(s): "WBC", "RBC", "HCT", "PLT" in the last 72 hours. No results for input(s): "NA", "K", "CL", "CO2", "BUN", "CREATININE", "GLUCOSE", "CALCIUM" in the last 72 hours. No results for input(s): "LABPT", "INR" in the last 72 hours.  Intact sensation and capillary refill all digits.  Moving all digits.  Can make a fist.  Dressing clean/dry/intact   Assessment/Plan: S/p fasciotomies left arm.  Under management of Dr. Marla Roe for wound management.  Okay to weight bear on left arm.  Will follow.  Can follow up in office after d/c.   [name] [name] 04/17/2022, 1:38 PM

## 2022-04-17 NOTE — Progress Notes (Signed)
Physical Therapy Session Note  Patient Details  Name: [name] [name] MRN: [id number] Date of Birth: [birth year]  Today's Date: 04/17/2022 PT Individual Time: 2694-8546 PT Individual Time Calculation (min): 42 min   Short Term Goals: Week 1:  PT Short Term Goal 1 (Week 1): Pt will perform bed mobility with CGA PT Short Term Goal 2 (Week 1): Pt will transfer to [facility] with CGA PT Short Term Goal 3 (Week 1): Pt will ambulate 67ft with min assist and LRAD PT Short Term Goal 4 (Week 1): Pt will propell WC >123ft with supervision assist using hemi technique  Skilled Therapeutic Interventions/Progress Updates:   Pt received supine in bed awake hugging his R LE stating it is soothing him due to the increased nerve pain in his L LE. Despite this, pt agreeable to therapy session.   Per ortho MD, pt is "Okay to weight bear on left arm." So precautions updated below. This change could allow pt to participate in additional gait training now that he can utilize B UE support on an AD.  Supine>sitting R EOB HOB partially elevated with supervision and cuing to go slowly to avoid quick stretch of hip external rotators and abductors. Therapist reinforced education on need for tennis shoes to trial AFOs. Donned slip-on shoes. Sit>stand from EOB to relying heavily on R UE support on bedrail to come to stand and balance with CGA, pt only WBing through R LE - L stand pivot to w/c with continued heavy reliance on bedrail and pt only pivoting on R LE with no WBing through L LE. Initiated ~72ft B UE and R LE w/c propulsion with supervision and then transported pt remainder of distance to gym for time management.   Sit>stand with B UE support on litegait handles with CGA, continuing to only rely on R LE support with limited to no L LE WBing - donned litegait harness (kept it above his hips to avoid placing pressure on painful area).   Gait training in litegait harness providing min body weight support and using B  UE support on handles ~19ft forward and then backwards - +2 assist to manage litegait machine and therapist providing the below assistance/facilitation: FORWARD:  - SWING PHASE: facilitating L LE positioning to avoid excessive adduction and ensure safe ankle position due to lack of muscle activation - STANCE PHASE: tactile cuing for increased L glute and quad muscle activation combined with L weight shift onto stance limb (pt tends to extend L LE while having strong R lean/weight shift), able to progress to longer L stance time allowing progression to slight step-through gait pattern BACKWARDS:  - SWING: cuing for increased hip/knee flexion activation and therapist facilitating L foot placement to ensure safe ankle position  - STANCE: cuing again for glute/quad activation and pt tolerating L LE WBing more during backwards gait compared to forwards  Standing as above, doffed litegait harness.   Transported back to room and pt requesting to return to bed. R stand pivot w/c>EOB continuing to rely heavily on R UE support on bedrail - therapist cuing for pt to utilize skills learned during backwards gait training to step L LE back towards bed and increase hip/knee extensor activation to promote WBing once in position rather than relying on R UE and R LE.   Pt left sitting EOB with needs in reach, lines intact, and bed alarm on.   Therapy Documentation Precautions:  Precautions Precautions: Fall, Other (comment) Precaution Comments: L forearm in dressing from fasciotomies and closure,  no ROM restrictions Restrictions Weight Bearing Restrictions: Yes LUE Weight Bearing: Weight bearing as tolerated (updated on 1/18) LLE Weight Bearing: Weight bearing as tolerated LLE Partial Weight Bearing Percentage or Pounds: N/A Other Position/Activity Restrictions: no ROM restrictions   Pain:  Continues to have significant L LE neuropathic pain - nurse notified for medication administration - provided  distraction, emotional support, and change of scenery with increased activation for pain management.   Therapy/Group: Individual Therapy  Tawana Scale , PT, DPT, NCS, CSRS 04/17/2022, 3:23 PM

## 2022-04-18 ENCOUNTER — Telehealth: Payer: Self-pay | Admitting: General Practice

## 2022-04-18 DIAGNOSIS — I639 Cerebral infarction, unspecified: Secondary | ICD-10-CM | POA: Diagnosis not present

## 2022-04-18 MED ORDER — DULOXETINE HCL 30 MG PO CPEP
60.0000 mg | ORAL_CAPSULE | Freq: Every day | ORAL | Status: DC
  Administered 2022-04-19 – 2022-04-21 (×3): 60 mg via ORAL
  Filled 2022-04-18 (×3): qty 2

## 2022-04-18 MED ORDER — TRAZODONE HCL 50 MG PO TABS
50.0000 mg | ORAL_TABLET | Freq: Every day | ORAL | Status: DC
  Administered 2022-04-18 – 2022-04-20 (×3): 50 mg via ORAL
  Filled 2022-04-18 (×3): qty 1

## 2022-04-18 NOTE — Progress Notes (Addendum)
PROGRESS NOTE   Subjective/Complaints: Pt in good spirits. He is tolerating more movement in the LLE. Feels that numbness has "decreased" in leg. Was able to sleep last night.   ROS: Patient denies fever, rash, sore throat, blurred vision, dizziness, nausea, vomiting, diarrhea, cough, shortness of breath or chest pain,  headache, or mood change.    Objective:   No results found. No results for input(s): "WBC", "HGB", "HCT", "PLT" in the last 72 hours.   No results for input(s): "NA", "K", "CL", "CO2", "GLUCOSE", "BUN", "CREATININE", "CALCIUM" in the last 72 hours.    Intake/Output Summary (Last 24 hours) at 04/18/2022 1015 Last data filed at 04/18/2022 0735 Gross per 24 hour  Intake 960 ml  Output 4375 ml  Net -3415 ml        Physical Exam: Vital Signs Blood pressure 116/80, pulse 100, temperature 98 F (36.7 C), temperature source Oral, resp. rate 16, height 5\' 7"  (1.702 m), weight 98.5 kg, SpO2 98 %.    Constitutional: No distress . Vital signs reviewed. HEENT: NCAT, EOMI, oral membranes moist Neck: supple Cardiovascular: RRR without murmur. No JVD    Respiratory/Chest: CTA Bilaterally without wheezes or rales. Normal effort    GI/Abdomen: BS +, non-tender, non-distended Ext: no clubbing, cyanosis, or edema Psych: pleasant and cooperative  Skin: tats, a few abrasions and healing wounds/dressed Neuro: Alert and oriented x 3. Normal insight and awareness. Intact Memory. Normal language and speech. Cranial nerve exam unremarkable  Moves all 4's. Unable to wiggle toes, adf/apf LLE--unchanged exam. Moves fingers left hand Musculoskeletal: left arm splinted. Left foot and lower leg very sensitive to touch but as a whole he's tolerating movement/palpation better.   Assessment/Plan: 1. Functional deficits which require 3+ hours per day of interdisciplinary therapy in a comprehensive inpatient rehab setting. Physiatrist  is providing close team supervision and 24 hour management of active medical problems listed below. Physiatrist and rehab team continue to assess barriers to discharge/monitor patient progress toward functional and medical goals  Care Tool:  Bathing    Body parts bathed by patient: Left arm, Chest, Abdomen, Front perineal area, Buttocks, Right upper leg, Left upper leg   Body parts bathed by helper: Right arm, Right lower leg, Left lower leg     Bathing assist Assist Level: Moderate Assistance - Patient 50 - 74%     Upper Body Dressing/Undressing Upper body dressing   What is the patient wearing?: Pull over shirt    Upper body assist Assist Level: Maximal Assistance - Patient 25 - 49%    Lower Body Dressing/Undressing Lower body dressing      What is the patient wearing?: Pants     Lower body assist Assist for lower body dressing: Total Assistance - Patient < 25%     Toileting Toileting    Toileting assist Assist for toileting: Maximal Assistance - Patient 25 - 49%     Transfers Chair/bed transfer  Transfers assist     Chair/bed transfer assist level: Minimal Assistance - Patient > 75%     Locomotion Ambulation   Ambulation assist      Assist level: Moderate Assistance - Patient 50 - 74% Assistive device: Parallel  bars Max distance: 5   Walk 10 feet activity   Assist  Walk 10 feet activity did not occur: Safety/medical concerns        Walk 50 feet activity   Assist Walk 50 feet with 2 turns activity did not occur: Safety/medical concerns         Walk 150 feet activity   Assist Walk 150 feet activity did not occur: Safety/medical concerns         Walk 10 feet on uneven surface  activity   Assist Walk 10 feet on uneven surfaces activity did not occur: Safety/medical concerns         Wheelchair     Assist Is the patient using a wheelchair?: Yes Type of Wheelchair: Manual    Wheelchair assist level: Minimal Assistance  - Patient > 75% (min to CGA when using R foot on floor, pt received Crocs today.) Max wheelchair distance: 150    Wheelchair 50 feet with 2 turns activity    Assist        Assist Level: Minimal Assistance - Patient > 75%   Wheelchair 150 feet activity     Assist      Assist Level: Minimal Assistance - Patient > 75%   Blood pressure 116/80, pulse 100, temperature 98 F (36.7 C), temperature source Oral, resp. rate 16, height 5\' 7"  (1.702 m), weight 98.5 kg, SpO2 98 %.  Medical Problem List and Plan: 1. Functional deficits secondary to bilateral acute/subacute cerebellar infarcts; left forearm compartment syndrome             -patient may shower if left arm covered             -ELOS/Goals: 12-14 days, mod I to supervision goals             -AFO per PT  -Continue CIR therapies including PT, OT, family  2.  Antithrombotics: -DVT/anticoagulation:  Pharmaceutical: Heparin             -antiplatelet therapy: aspirin 81 mg daily   3. 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     -continue cymbalta (now 40mg )   -prednisone taper to 10mg  1/19 then off   -continue  lyrica  @ 150mg  tid   -continue pamelor  @ 50mg  qhs   -1/19 pain improving  4. Mood/Behavior/Sleep: LCSW to evaluate and provide emotional support             -antipsychotic agents: n/a   -appreciate psychiatry consult   -cymbalta increased to 40mg  daily   -pamelor 50mg    -outpt f/u 5. Neuropsych/cognition: This patient is capable of making decisions on  his own behalf.             -SLP eval   6. Skin/Wound Care: Routine skin care checks             -left forearm woundVAC per plastic surgery--vac change/myriad 1/18   7. Fluids/Electrolytes/Nutrition: Strict Is and Os and follow-up chemistries             -renal diet with fluid restriction              -hypoalbuminemia: continue protein supplements             -continue thiamine, folate supplementation   8: Polysubstance abuse:  cessation counseling   9: Left forearm compartment syndrome status post fasciotomies 12/21 Dr. Fredna Dow             -wound management per plastic surgery. Myriad sheet placement yesterday             -carpal tunnel release left hand also has been performed             -continue ROM exercises, splint, limit weight bearing to 10 lbs.   10: Acute kidney injury:   1/11 RIJ removed    1/15 Cr 1.43  1/19 continues to improve. Intake excellent-->recheck monday 11: Rhabdomyolysis: CK down to 906   12: Shock liver/elevated LFTs,     -WNL 1/10 13: Left gluteal myositis, ongoing pain: Continue AFO for foot drop             -outpatient NCV/EMG to assess for potential sciatic nv injury             -ROM without restrictions   -see pain mgt above 14: Hyperphosphatemia   15: Anemia,acute blood loss/hemodilution/HD, s/p 1 unit PCs: follow-up CBC   -1/10 hgb down to 7.1--no gross blood loss  -f/u Monday 16: Hypertension: monitor TID and prn             -continue Norvasc 5 mg daily             -hydralazine 25 mg TID    Vitals:   04/18/22 0315 04/18/22 0626  BP: (!) 121/51 116/80  Pulse: 100   Resp: 16   Temp: 98 F (36.7 C)   SpO2: 98%      1/19 good control 17.  Opioid induced constipation  -scheduled Miralax BID, fiber, colace  -LBM 1/19  LOS: 10 days A FACE TO [place] [facility] [employer] 04/18/2022, 10:15 AM

## 2022-04-18 NOTE — Progress Notes (Addendum)
Patient ID: [name] [name], male   DOB: 09-14-1980, 42 y.o.   MRN: [id number]  SW waiting on updates from [place] to discuss if the wound vac will be released by insurance. SW waiting on appropriate documentation for insurance purposes.   *Wound vac released.   SW delivered wound vac to room. SW informed pt he will have wound vac changes in clinic. SW will confirm schedule.  Pt received TTB, no DABSC. SW to look into issue. Pt reports he is supposed to have w/c. SW will confirm with therpay team. Pt aware SW will follow-up with his sister [name] [name].   1449-SW followed up with pt sister [name] [name] to discuss family education. No questions/concerns. SW discussed wound vac, and pt having wound vac changes in clinic. SW informed will confirm. Pt sister states she can transport to outpatient therapies. Would like referral to go to [facility]. SW will find PCP provider in [place]. She is aware SW will confirm any final details.   1523-SW returned phone call to Clenton Pare with [employer] to discuss PCS referral. SW left message and waiting on follow-up.  *SW received return phone call. Will fax SW PCS form needed. SW gave contact number for pt sister for follow-up.   1533-scheduled new patient appt with [facility] for Monday, February 5 at 10am with Dr. Court Joy (initial new patient appt/hospital follow-up).  [name] confirms out of stock for [facility] and will be shipped to home.   *plastics reports would like to see pt once a week in clinic; beginning one week from wound vac being placed. Will reach out to patient to schedule. SW informed to call his sister [name] [name] to schedule.   [name], MSW, [place] Office: [phone] Cell: [phone] Fax: [phone]

## 2022-04-18 NOTE — Progress Notes (Signed)
Physical Therapy Weekly Progress Note  Patient Details  Name: [name] [name] MRN: [id number] Date of Birth: 1980-04-08  Beginning of progress report period: April 09, 2022 End of progress report period: April 17, 2022  Today's Date: 04/18/2022 PT Individual Time: 1000-1100 PT Individual Time Calculation (min): 60 min   Patient has met 2 of 4 short term goals.  Pt presents still w/ increased burning pain down LLE which hinders WB through foot.  Pt has been on Litegait  and amb 25' forward and back now that pt is WBAT LUE.  Pt is supervision for bed mobility but cues for safety as well as breathing technique as well as relying only on R UE and swiveling on bottom to EOB.  Pt now able to transfer sit to stand and SPT w/c <> bed w/ CGA, w/ minimal use of LLE.  Pt able to negotiate w/c using BUES and RLE ~ 25-50', but then LUE fatigues/increased pain and unable to continue.  Pt continues to make progress, but is impulsive and requires cueing for safety as well as improving performance rather than "just getting it done."  Patient continues to demonstrate the following deficits muscle weakness and muscle joint tightness and decreased coordination and decreased motor planning and therefore will continue to benefit from skilled PT intervention to increase functional independence with mobility.  Patient progressing toward long term goals..  Continue plan of care.  PT Short Term Goals Week 2:     Skilled Therapeutic Interventions/Progress Updates: Pt presents sitting in w/c and agreeable to therapy.  Family members present for family ed, w/ sister participating.  Discussed set-up of room at home and placement of w/c for transfers in/OOB whether SPT or w/ RW now that pt is WBAT to LUE.  Encouraged pt to use LUE instead of just pulilng and swiveling on RUE and LE.  Pt requires max cues to slow down and listen to instructions and cues for safe mobility.  Pt performed sit to stand from w/c w/  supervision and cues for LUE use to RW and stp-pivot w/c > bed.  Pt sitting before placing hands for controlled descent.  Pt aware of need to slow down but still requires verbal cues to perform. Pt negotiated w/c x 50' w/ BUES and RLE before fatiguing and sister completing to small gym.  Pt amb x 6' w/ RW and CGA to car for practice in/out of SUV height simulated car transfer w/ min to CGA.  Pt transferred w/ CGA.  Discussed w/ sister and pt, moving seat back as well as reclining seat back.  Pt performed sit to stand and SPT w/ supervision, but cues for use of LUE and safety/speed.  Pt wheeled to main gym for practice w/ stoop height w/c bump-up.  Pt states only about 2" step, and practiced 3 3/4" height w/ sister in front.  There is some discrepancy between the siblings in regards to height, so suggested taking a picture of step for clarification for next treatment session.  Pt remained sitting in w/c w/ all needs in reach, family members present.     Therapy Documentation Precautions:  Precautions Precautions: Fall, Other (comment) Precaution Comments: L forearm in dressing from fasciotomies and closure, no ROM restrictions Restrictions Weight Bearing Restrictions: Yes LUE Weight Bearing: Partial weight bearing LLE Weight Bearing: Partial weight bearing LLE Partial Weight Bearing Percentage or Pounds: N/A Other Position/Activity Restrictions: no ROM restrictions General:   Vital Signs:   Pain: Pain Assessment Pain Scale: 0-10 Pain  Score: 9  Faces Pain Scale: Hurts even more Pain Type: Acute pain Pain Location: Leg Pain Orientation: Left Pain Descriptors / Indicators: Aching;Shooting Pain Frequency: Constant Pain Onset: On-going Pain Intervention(s): Medication (See eMAR) Vision/Perception        Therapy/Group: Individual Therapy  [name] 04/18/2022, 11:02 AM
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
## 2022-04-18 NOTE — Progress Notes (Signed)
Speech Language Pathology Daily Session Note  Patient Details  Name: [name] [name] MRN: [id number] Date of Birth: 09/27/1980  Today's Date: 04/18/2022 SLP Individual Time: 1130-1200 SLP Individual Time Calculation (min): 30 min  Short Term Goals: Week 2: SLP Short Term Goal 1 (Week 2): STGs=LTGs due to ELOS  Skilled Therapeutic Interventions: Pt seen this date for skilled ST intervention targeting cognitive goals outlined in care plan. Pt received awake/alert and OOB in w/c, watching TV. Pt's sister and mother arrived shortly after SLP arrived. Agreeable to intervention in hospital room. Today's session with emphasis on pt and family education + training to prepare for upcoming d/c home. Pt with pain; however, participatory.   Education provided to pt and pt's sister and mother re: ST POC, pt's progress thus far, compensatory memory strategies, strategies for living a brain healthy lifestyle (DANCERS), and need for assistance and supervision with more complex IADL tasks to include medication and financial management. Pt completed TID pill organizer task for 3 of his current medications given Mod A verbal and visual cues particularly for comprehension of more complex medication directions (take 1 tablet, 3 times a day). As task progressed, SLP successfully faded cues to Min A.Benefited from SLP double checking placement for accuracy given one pill was placed twice in the same box. Provided additional education on energy conservation techniques as it relates to complex IADL completion; pt and pt's family verbalized understanding. Reinforced aforementioned education with handouts. Offered several opportunities for questions. All questions re: cognitive functioning answered and addressed.   Pt left in room and OOB in w/c with all safety measures activated, call bell within reach, and all immediate needs met. Family remained at bedside at conclusion of session with pt activating call bell to  request pain medication. Continue per current ST POC.  Pain Pain reported in LLE - pt with intermittent grimacing. SLP provided distraction as a pain management technique.   Therapy/Group: Individual Therapy  [name] [name] 04/18/2022, 1:14 PM

## 2022-04-18 NOTE — Progress Notes (Signed)
Occupational Therapy Session Note  Patient Details  Name: Melecio Cueto MRN: [id number] Date of Birth: July 09, 1980  Today's Date: 04/18/2022 OT Individual Time: 0900-1000 OT Individual Time Calculation (min): 60 min   Today's Date: 04/18/2022 OT Individual Time: 1350-1430 OT Individual Time Calculation (min): 40 min  Short Term Goals: Week 2:  OT Short Term Goal 1 (Week 2): STG=LTG d/t ELOS  Skilled Therapeutic Interventions/Progress Updates:    Session focus on family education: pt and sister perform BADL at sink level to simulate immediate at home. Cuing for set up, supervision strategies, key points of control, hand placement during transfers and w/c parts management. Sister is CNA and familiar with supervision strategies. Educated on desensitization techniques as well as HEP for BUE. Sister with hands on practice of superviison of stand pivot transfer to [facility] with RW.   General mobility- they should always use their RW. They may benefit from a walker tray to transport items from room to room if walking with a walker is recommended by physical therapy. The walker should be kept within reach so they can pull it close to get up and keep with them to back up to any surface they want to sit on. When getting up, they should push up from the surface they are getting up from and reach back when sitting to a new surface, no plopping.  You are their "shadow." Especially in the beginning. You, as the helper should be in reach of the patient when mobilizing. You should be either beside or behind them so if they lose their balance you can assist by helping correct at the hips. This is likely closer than you are used to being- be in their personal space. Use a gait belt if that makes you feel more comfortable If you are attempting to get up/transfer and it is not going well, reset. Have them sit back down. Make sure they are close to the edge of the seat, feet are underneath them at hips distance,  and they are leaning forward to stand up. Bathing- they should sit to bathe on a shower chair, especially for washing legs/feet. Sitting will save energy and increase safety. For a tub shower with shower chair: Use the walker to walk up to shower/tub edge and leave it to the side, but close. they can use the wall to steady as they step over or a grab bar. Do your best to dry off the floor prior to getting out of the shower For tub shower with Tub bench: use walker to get to the edge of the tub bench, back up to the edge, reach back prior to sitting down. Turn to swing legs into tub and scoot across. Reverse to exit the tub. Make sure both legs are out of the tub prior to standing to exit the bathroom Walk in shower: walk up to the shower ledge, turn around and back up to the ledge with the walker. Keep both hands-on walker while they step back one foot at a time Dressing- all should be done from a SEATED level, especially to put underwear and pants over feet.  Toileting- the RW can be walked right over the toilet for standing urination if applicable. If seated toileting is more appropriate, have them walk up to the toilet and keep walker with them as they turn to sit to toilet or BSC. Before they stand to pull up pants past hips they should pull pants/underwear up past their knees to decrease the need to bend  forward to the floor. Sometimes this makes people dizzy if incontinence/bathroom accidents are an issue attempt to toilet every 2-3 hours to improve success with toileting and decrease accidents.  Energy conservation principles- Prioritize what needs to be done and what can be moved to another day Plan out their days, weeks, months to spread out taxing (physical or cognitively tiring) activities to not put too much at one time Pace activities- rest before feeling tired and have designated places to rest if they feel tired and need to take a brake Position for success: sit when able to conserve 25%  more energy than standing   Pt left at end of session in bed with exit alarm on, call light in reach and all needs met  Session 2: pt received in w/c with unrated premedicated pain. Trialed compression sock for pain and rest and repositioning throughout. MOD A To don compression socks and shoes. Pt  completes 1x25 feet and 1x15 feet of functional mobility with RW in dayroom with OT applying washcloths and tape to L hand of walker to improve LUE grip. Exited session with pt seated in w/c and call light in reach    Therapy Documentation Precautions:  Precautions Precautions: Fall, Other (comment) Precaution Comments: L forearm in dressing from fasciotomies and closure, no ROM restrictions Restrictions Weight Bearing Restrictions: Yes LUE Weight Bearing: Partial weight bearing LLE Weight Bearing: Partial weight bearing LLE Partial Weight Bearing Percentage or Pounds: N/A Other Position/Activity Restrictions: no ROM restrictions  Therapy/Group: Individual Therapy  [name] 04/18/2022, 6:53 AM

## 2022-04-18 NOTE — Progress Notes (Addendum)
Update to [employer] documentation.  His left forearm wound measured 6 x 20 cm x 0.5 cm.

## 2022-04-18 NOTE — Telephone Encounter (Signed)
New patient   Hosp DC 1/22 [place]   TOC / new patient scheduled   TOC call needed (if needed)

## 2022-04-19 NOTE — Progress Notes (Signed)
Occupational Therapy Session Note  Patient Details  Name: [name] [name] MRN: [id number] Date of Birth: 04-10-1980  Today's Date: 04/19/2022 OT Individual Time: 9244-6286 OT Individual Time Calculation (min): 43 min   Today's Date: 04/19/2022 OT Individual Time: 1300-1345 OT Individual Time Calculation (min): 45 min  Short Term Goals: Week 2:  OT Short Term Goal 1 (Week 2): STG=LTG d/t ELOS  Skilled Therapeutic Interventions/Progress Updates:    Session 1:  Pt received in bed with 10 out of 10 pain in LLE. Massage and repositioning provided for pain relief. Pt states, "we beefing afer yesterday when OT enters"  ADL: Pt completes stand pivot transfer with CGA mostly for pt confidence as no asistance was provided from this clinician. Grooming at sink with set up of items.  Therapeutic exercise Soft tisue mobilzation palpating piriformis with noticible trigger point. 2 min moderate pressure hold and then myofascial release techniques provided with moderate increase in tissue laxity. Pt reporting some relief   Therapeutic activity Pt hyperverbal throughout session with difficulty focusing on education about stretching/WB through LLE with mobility yesterday and anticipate soreness as muscles stretched and used more yesterday than in the last 2 weeks. Pt needs to hear this multiple times before able to focus and acknowledge the information. Educated pt to limit transfers to stand pivot and only walk with sister if needing to walk into bathroom to take a shower + therapy till LE can tolerate more frequent mobility.  Pt left at end of session in bed with exit alarm on, call light in reach and all needs met  Session 2:  Pt received in bed with 8 out of 10 pain in LLE. Rest and repositioning provided for pain relief  ADL: Pt completes ADL at overall supervision Level. Skilled interventions include: Squat pivot transfer to/from EOB/w/c. Bathing sit to stand level at sink with  cuing for hemi technique for lB dressing and energy conservation threading both underwear and pants Sock aide for footwear and supervision for LB dressing   Pt left at end of session in bed with exit alarm on, call light in reach and all needs met  Therapy Documentation Precautions:  Precautions Precautions: Fall, Other (comment) Precaution Comments: L forearm in dressing from fasciotomies and closure, no ROM restrictions Restrictions Weight Bearing Restrictions: Yes LUE Weight Bearing: Partial weight bearing LLE Weight Bearing: Partial weight bearing LLE Partial Weight Bearing Percentage or Pounds: N/A Other Position/Activity Restrictions: no ROM restrictions  Therapy/Group: Individual Therapy  [name] 04/19/2022, 5:58 AM

## 2022-04-19 NOTE — Progress Notes (Signed)
Physical Therapy Session Note  Patient Details  Name: [name] [name] MRN: [id number] Date of Birth: 1980/05/20  Today's Date: 04/19/2022 PT Individual Time: 1033-1108 PT Individual Time Calculation (min): 35 min   Short Term Goals: Week 1:  PT Short Term Goal 1 (Week 1): Pt will perform bed mobility with CGA PT Short Term Goal 1 - Progress (Week 1): Met PT Short Term Goal 2 (Week 1): Pt will transfer to [facility] with CGA PT Short Term Goal 2 - Progress (Week 1): Met PT Short Term Goal 3 (Week 1): Pt will ambulate 21ft with min assist and LRAD PT Short Term Goal 3 - Progress (Week 1): Partly met PT Short Term Goal 4 (Week 1): Pt will propell WC >143ft with supervision assist using hemi technique PT Short Term Goal 4 - Progress (Week 1): Partly met Week 2:     Skilled Therapeutic Interventions/Progress Updates:  Patient supine in bed on entrance to room. Patient alert and agreeable to PT session. However does immediately relate high levels of pain in LLE, L buttock, and then lower pain level in L arm. Is appreciative of ability to leave bed the last couple of days but feels as though his increased mobility has caused increased pain levels today. Does not wish to mobilize OOB unless pain will not increase.   Educated pt re: mental rewiring that needs to take place in order to correct brain's incorrect interpretation of current quality of pain. When he feels burning or pins/ needles, will need to consciously practice focus that there is no fire or needles present and mental pain feelings need to be adjusted to reflect. Focus on changing quality of pain until pain level reduces.   On palpation to pt's areas of complaints, trigger points and adhesions are present. And pt open to manual therapy in order to reduce.   Therapeutic Activity: Bed Mobility: Pt demos ability to mobilize all over bed with care to LLE and LUE with supervision. Reaches seated position on R side of bed and is also able  to position self supine and in sidelying to R side for manual therapy to LLE. All with supervision/ mod I.   Manual Therapy: STM and TPR applied to posterior aspect of greater trochanter to address TP and tightness noted in hip ERs. After manual therapy, noted decrease in tension and TP. IT band also noted with adhesions along length and back of spoon used for IASTM to reduce noted adhesions. Pt with noted reduction in pain. Deep calf musculature addressed with STM/ CFM and with noted reduction in tension. Hot pack applied to greater trochanter for reduced muscle tension and to promote relaxation.   Patient supine at end of session with brakes locked, bed alarm set, and all needs within reach. Cranberry juice provided on request.    Therapy Documentation Precautions:  Precautions Precautions: Fall, Other (comment) Precaution Comments: L forearm in dressing from fasciotomies and closure, no ROM restrictions Restrictions Weight Bearing Restrictions: Yes LUE Weight Bearing: Partial weight bearing LLE Weight Bearing: Partial weight bearing LLE Partial Weight Bearing Percentage or Pounds: N/A Other Position/Activity Restrictions: no ROM restrictions General:   Vital Signs:   Pain: Pain Assessment Pain Scale: 0-10 Pain Score: 8   Therapy/Group: Individual Therapy  Alger Simons PT, DPT, CSRS 04/19/2022, 12:57 PM

## 2022-04-19 NOTE — Progress Notes (Signed)
Occupational Therapy Discharge Summary  Patient Details  Name: [name] [name] MRN: [id number] Date of Birth: 09/09/1980  Date of Discharge from [facility] service:April 21, 2021  Patient has met 8 of 8 long term goals due to improved activity tolerance, improved balance, postural control, ability to compensate for deficits, functional use of  LEFT upper and LEFT lower extremity, improved attention, improved awareness, and improved coordination.  Patient to discharge at overall Supervision level.  Patient's care partner is independent to provide the necessary  supervision  assistance at discharge.  Pt sister and mother present for familiy education and aware of DME recommendations and AE to improve indpendence  Reasons goals not met: n/a  Recommendation:  Patient will benefit from ongoing skilled OT services in outpatient setting to continue to advance functional skills in the area of BADL, iADL, and Reduce care partner burden.  Equipment: BSC/TTB  Reasons for discharge: treatment goals met and discharge from hospital  Patient/family agrees with progress made and goals achieved: Yes  OT Discharge Precautions/Restrictions  Precautions Precautions: Fall;Other (comment) Precaution Comments: L forearm in dressing from fasciotomies and closure, no ROM restrictions Required Braces or Orthoses: Other Brace Other Brace: foot up brace for L foot Restrictions Weight Bearing Restrictions: Yes LUE Weight Bearing: Weight bearing as tolerated LLE Weight Bearing: Weight bearing as tolerated LLE Partial Weight Bearing Percentage or Pounds: N/A General   Vital Signs Therapy Vitals Temp: 98.3 F (36.8 C) Temp Source: Oral Pulse Rate: (!) 110 Resp: 18 BP: 129/80 Patient Position (if appropriate): Lying Oxygen Therapy SpO2: 99 % O2 Device: Room Air Pain Pain Assessment Pain Score: 8  Pain Type: Acute pain ADL ADL Eating: Supervision/safety Grooming: Supervision/safety Upper  Body Bathing: Supervision/safety Lower Body Bathing: Supervision/safety Upper Body Dressing: Supervision/safety Lower Body Dressing: Supervision/safety Toileting: Supervision/safety Toilet Transfer: Close supervision Tub/Shower Transfer: Close supervison Tub/Shower Equipment: Radio broadcast assistant Vision Baseline Vision/History: 0 No visual deficits Vision Assessment?: No apparent visual deficits Perception  Perception: Within Functional Limits Praxis Praxis: Intact Cognition Cognition Overall Cognitive Status: Within Functional Limits for tasks assessed Arousal/Alertness: Lethargic Orientation Level: Person;Place;Situation Sustained Attention: Appears intact Awareness: Appears intact Safety/Judgment: Appears intact Brief Interview for Mental Status (BIMS) Repetition of Three Words (First Attempt): 3 Temporal Orientation: Year: Correct Temporal Orientation: Month: Accurate within 5 days Temporal Orientation: Day: Correct Recall: "Sock": Yes, no cue required Recall: "Blue": Yes, no cue required Recall: "Bed": Yes, no cue required BIMS Summary Score: 15 Sensation Sensation Light Touch: Impaired by gross assessment Coordination Gross Motor Movements are Fluid and Coordinated: No Fine Motor Movements are Fluid and Coordinated: No Coordination and Movement Description: LUE/LE impaired d/t pain and hypersensitivity d/t rhabdo, LUE Improved since eval but remains limited in strength of grip Motor  Motor Motor: Abnormal tone;Abnormal postural alignment and control;Other (comment) Motor - Skilled Clinical Observations: foot drop on the LLE. limited assessment due ot severe pain in the distal LLE Mobility  Bed Mobility Rolling Right: Independent Rolling Left: Independent Supine to Sit: Independent Sit to Supine: Independent Transfers Sit to Stand: Supervision/Verbal cueing Stand to Sit: Supervision/Verbal cueing  Trunk/Postural Assessment  Cervical Assessment Cervical  Assessment: Within Functional Limits Thoracic Assessment Thoracic Assessment: Within Functional Limits Lumbar Assessment Lumbar Assessment: Within Functional Limits Postural Control Postural Control: Deficits on evaluation (d/t pain)  Balance Balance Balance Assessed: Yes Dynamic Sitting Balance Dynamic Sitting - Level of Assistance: 6: Modified independent (Device/Increase time) Static Standing Balance Static Standing - Level of Assistance: 5: Stand by assistance Dynamic Standing Balance Dynamic Standing -  Level of Assistance: 5: Stand by assistance Extremity/Trunk Assessment RUE Assessment RUE Assessment: Exceptions to [facility] General Strength Comments: increased abduciton with funciotnal reach ? rotator cuff involvment LUE Assessment LUE Assessment: Exceptions to [facility] General Strength Comments: wound vac on dorsal and ventral forearm, pt able to move [facility] at shoulder, 1WBAT   [name] 04/19/2022, 2:51 PM
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
## 2022-04-20 NOTE — Progress Notes (Signed)
Occupational Therapy Session Note  Patient Details  Name: [name] [name] MRN: [id number] Date of Birth: 1981/01/29  Today's Date: 04/20/2022 OT Individual Time: 1100-1200 OT Individual Time Calculation (min): 60 min   Patient sitting EOB at OT arrival stating his left hip and calf are hurting really bad with shooting pain, cramping, and tingling down leg and into foot.  Offered patient to ask nurse for Tylenol and also provision of modalities including TENs, heat, or ice.  Patient reported that when therapy has massaged his leg in the past it seemed to help the most and requesting this therapist to assist.  Patient completed squat pivot to w/c with supervision.  Self propelled halfway down hall then requesting this therapist to transport him the rest of the way.  Squat pivot completed to EOM, and sit to supine, then rolled to sidelying with supervision and intermittent hand held assist per patient request, although likely he could have completed without this assist.  Provided soft tissue massage using longitudinal and slow progressive pressure techniques over areas of concern.  Educated client on various self stretching techniques including modified figure 4, modified IT band stretch, and modified calf stretch using gait belt. Patient exhibited poor tolerance to each stretch and only able to hold for 1-2 seconds before stating it is too painful, therefore discontinued each after this report.  Returned to room via w/c transport and left sitting up in w/c, call bell in reach, nurse tech and nurse made aware of patients request for Tylenol at end of session and patients location.   Short Term Goals: Week 1:  OT Short Term Goal 1 (Week 1): Pt will transfer to toilet wiht LRAD and CGA OT Short Term Goal 1 - Progress (Week 1): Met OT Short Term Goal 2 (Week 1): Pt will compelte 2/3 steps of LB dressing OT Short Term Goal 2 - Progress (Week 1): Met OT Short Term Goal 3 (Week 1): Pt will don shirt  wiht MIN A OT Short Term Goal 3 - Progress (Week 1): Met OT Short Term Goal 4 (Week 1): Pt will recall hemi dressing techniqes wiht MIN question cues OT Short Term Goal 4 - Progress (Week 1): Met Week 2:  OT Short Term Goal 1 (Week 2): STG=LTG d/t ELOS Week 3:     Skilled Therapeutic Interventions/Progress Updates:      Therapy Documentation Precautions:  Precautions Precautions: Fall, Other (comment) Precaution Comments: L forearm in dressing from fasciotomies and closure, no ROM restrictions Required Braces or Orthoses: Other Brace Other Brace: foot up brace for L foot Restrictions Weight Bearing Restrictions: Yes LUE Weight Bearing: Partial weight bearing LLE Weight Bearing: Partial weight bearing LLE Partial Weight Bearing Percentage or Pounds: N/A Other Position/Activity Restrictions: no ROM restrictions    Therapy/Group: Individual Therapy  Ezekiel Slocumb 04/20/2022, 1:01 PM

## 2022-04-20 NOTE — Progress Notes (Signed)
PROGRESS NOTE   Subjective/Complaints:  C/o pain this am, trying to reposition LLE   ROS: Neg CP, SOB, N/V/D   Objective:   No results found. No results for input(s): "WBC", "HGB", "HCT", "PLT" in the last 72 hours.   No results for input(s): "NA", "K", "CL", "CO2", "GLUCOSE", "BUN", "CREATININE", "CALCIUM" in the last 72 hours.    Intake/Output Summary (Last 24 hours) at 04/20/2022 1020 Last data filed at 04/20/2022 0536 Gross per 24 hour  Intake 600 ml  Output 2850 ml  Net -2250 ml         Physical Exam: Vital Signs Blood pressure 124/82, pulse 95, temperature 98 F (36.7 C), temperature source Oral, resp. rate 18, height 5\' 7"  (1.702 m), weight 98.5 kg, SpO2 100 %.    General: No acute distress Mood and affect are appropriate Heart: Regular rate and rhythm no rubs murmurs or extra sounds Lungs: Clear to auscultation, breathing unlabored, no rales or wheezes Abdomen: Positive bowel sounds, soft nontender to palpation, nondistended Extremities: No clubbing, cyanosis, or edema Skin: No evidence of breakdown, no evidence of rash   Neuro: Alert and oriented x 3. Normal insight and awareness. Intact Memory. Normal language and speech. Cranial nerve exam unremarkable  Moves all 4's. Unable to wiggle toes, adf/apf LLE--unchanged exam. Moves fingers left hand Sensation abent below the ankle LLE Musculoskeletal: left arm splinted. Left foot and lower leg very sensitive to touch but as a whole he's tolerating movement/palpation better.   Assessment/Plan: 1. Functional deficits which require 3+ hours per day of interdisciplinary therapy in a comprehensive inpatient rehab setting. Physiatrist is providing close team supervision and 24 hour management of active medical problems listed below. Physiatrist and rehab team continue to assess barriers to discharge/monitor patient progress toward functional and medical  goals  Care Tool:  Bathing    Body parts bathed by patient: Left arm, Chest, Abdomen, Front perineal area, Buttocks, Right upper leg, Left upper leg, Right arm, Right lower leg, Left lower leg, Face   Body parts bathed by helper: Right arm, Right lower leg, Left lower leg     Bathing assist Assist Level: Supervision/Verbal cueing     Upper Body Dressing/Undressing Upper body dressing   What is the patient wearing?: Pull over shirt    Upper body assist Assist Level: Supervision/Verbal cueing    Lower Body Dressing/Undressing Lower body dressing      What is the patient wearing?: Pants, Underwear/pull up     Lower body assist Assist for lower body dressing: Supervision/Verbal cueing     Toileting Toileting    Toileting assist Assist for toileting: Supervision/Verbal cueing     Transfers Chair/bed transfer  Transfers assist     Chair/bed transfer assist level: Supervision/Verbal cueing     Locomotion Ambulation   Ambulation assist      Assist level: Minimal Assistance - Patient > 75% Assistive device: Walker-rolling Max distance: 6   Walk 10 feet activity   Assist  Walk 10 feet activity did not occur: Safety/medical concerns        Walk 50 feet activity   Assist Walk 50 feet with 2 turns activity did not occur: Safety/medical  concerns         Walk 150 feet activity   Assist Walk 150 feet activity did not occur: Safety/medical concerns         Walk 10 feet on uneven surface  activity   Assist Walk 10 feet on uneven surfaces activity did not occur: Safety/medical concerns         Wheelchair     Assist Is the patient using a wheelchair?: Yes Type of Wheelchair: Manual    Wheelchair assist level: Supervision/Verbal cueing Max wheelchair distance: 50    Wheelchair 50 feet with 2 turns activity    Assist        Assist Level: Supervision/Verbal cueing   Wheelchair 150 feet activity     Assist       Assist Level: Maximal Assistance - Patient 25 - 49%   Blood pressure 124/82, pulse 95, temperature 98 F (36.7 C), temperature source Oral, resp. rate 18, height 5\' 7"  (1.702 m), weight 98.5 kg, SpO2 100 %.  Medical Problem List and Plan: 1. Functional deficits secondary to bilateral acute/subacute cerebellar infarcts; left forearm compartment syndrome             -patient may shower if left arm covered             -ELOS/Goals: 1/22 or 1/23, needs DME, wound vac, establish with PCP             -AFO per PT  -Continue CIR therapies including PT, OT, family  2.  Antithrombotics: -DVT/anticoagulation:  Pharmaceutical: Heparin             -antiplatelet therapy: aspirin 81 mg daily   3. 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     -continue cymbalta (now 40mg )   -prednisone taper to 10mg  1/19 then off   -continue  lyrica  @ 150mg  tid   -continue pamelor  @ 50mg  qhs   -1/19 pain improving  4. Mood/Behavior/Sleep: LCSW to evaluate and provide emotional support             -antipsychotic agents: n/a   -appreciate psychiatry consult   -cymbalta increased to 40mg  daily   -pamelor 50mg    -outpt f/u 5. Neuropsych/cognition: This patient is capable of making decisions on  his own behalf.             -SLP eval   6. Skin/Wound Care: Routine skin care checks             -left forearm woundVAC per plastic surgery--vac change/myriad 1/18   7. Fluids/Electrolytes/Nutrition: Strict Is and Os and follow-up chemistries             -renal diet with fluid restriction             -hypoalbuminemia: continue protein supplements             -continue thiamine, folate supplementation   8: Polysubstance abuse: cessation counseling   9: Left forearm compartment syndrome status post fasciotomies 12/21 Dr. Fredna Dow             -wound  management per plastic surgery. Myriad sheet placement yesterday             -carpal tunnel release left hand also has been performed             -  continue ROM exercises, splint, limit weight bearing to 10 lbs.  appreciate surgery f/u- needs wound vac at home  10: Acute kidney injury:   1/11 RIJ removed    1/15 Cr 1.43  1/19 continues to improve. Intake excellent-->recheck monday 11: Rhabdomyolysis: CK down to 906   12: Shock liver/elevated LFTs,     -WNL 1/10 13: Left gluteal myositis, ongoing pain: Continue AFO for foot drop             -outpatient NCV/EMG to assess for potential sciatic nv injury             -ROM without restrictions   -see pain mgt above 14: Hyperphosphatemia   15: Anemia,acute blood loss/hemodilution/HD, s/p 1 unit PCs: follow-up CBC   -1/10 hgb down to 7.1--no gross blood loss  -f/u Monday 16: Hypertension: monitor TID and prn             -continue Norvasc 5 mg daily             -hydralazine 25 mg TID    Vitals:   04/19/22 1922 04/20/22 0301  BP: 115/78 124/82  Pulse: (!) 109 95  Resp: 18 18  Temp: 98.5 F (36.9 C) 98 F (36.7 C)  SpO2: 99% 100%     1/19 good control 17.  Opioid induced constipation  -scheduled Miralax BID, fiber, colace  -LBM 1/20  LOS: 12 days A FACE TO FACE EVALUATION WAS PERFORMED  Charlett Blake 04/20/2022, 10:20 AM

## 2022-04-20 NOTE — Progress Notes (Signed)
Speech Language Pathology Daily Session Note  Patient Details  Name: [name] [name] MRN: [id number] Date of Birth: 01-26-1981  Today's Date: 04/20/2022 SLP Individual Time: 1300-1400 SLP Individual Time Calculation (min): 60 min  Short Term Goals: Week 2: SLP Short Term Goal 1 (Week 2): STGs=LTGs due to ELOS  Skilled Therapeutic Interventions:  Pt was seen for skilled ST targeting cognitive goals.  Upon arrival, pt was seated in wheelchair, requesting to get back into bed due to leg pain.  Pt transferred from wheelchair to bed using bed rails for support with close supervision and no overt safety concerns noted.  Pt reportedly feeling anxious about going home, stating that no one has discussed discharge plans with him.  Pt reports that he feels he would benefit from staying in rehab for "a couple more weeks."  Pt states he is concerned about his leg pain today and thinks that he'll be "stuck in a bed" and his life will be "tragic" if he goes home in his current state.  SLP encouraged pt to list things that he can safely do for himself and things that his family can assist him with to illustrate that his current level of function and familial support does not suggest that he will be solely confined to a bed. This appeared to calm pt. Despite pt reportedly feeling excluded about the discharge planning process, he ultimately stated that he trusted his sister who is making decisions on his behalf which appeared to  further calm him.  In addition to pt's reported concerns about discharge, pt's participation in treatment was also significantly limited by nerve pain and spasms in his LLE.  Pt requested pain meds appropriately using call bell multiple times throughout session and nursing arrived as therapist was leaving to administer.  Pt was left in bed with bed alarm set and call bell within reach.  Continue per current plan of care.    Pain Pain Assessment Pain Scale: 0-10 Pain Score: 7  Pain  Location: Leg Pain Orientation: Left Pain Descriptors / Indicators: Aching Pain Intervention(s): RN made aware  Therapy/Group: Individual Therapy  Adiel Erney, Selinda Orion 04/20/2022, 3:51 PM

## 2022-04-20 NOTE — Progress Notes (Signed)
Physical Therapy Session Note  Patient Details  Name: [name] [name] MRN: [id number] Date of Birth: [birth year]  Today's Date: 04/20/2022 PT Individual Time: 0802-0855 PT Individual Time Calculation (min): 53 min   Short Term Goals: Week 1:  PT Short Term Goal 1 (Week 1): Pt will perform bed mobility with CGA PT Short Term Goal 1 - Progress (Week 1): Met PT Short Term Goal 2 (Week 1): Pt will transfer to [facility] with CGA PT Short Term Goal 2 - Progress (Week 1): Met PT Short Term Goal 3 (Week 1): Pt will ambulate 54ft with min assist and LRAD PT Short Term Goal 3 - Progress (Week 1): Partly met PT Short Term Goal 4 (Week 1): Pt will propell WC >119ft with supervision assist using hemi technique PT Short Term Goal 4 - Progress (Week 1): Partly met  Skilled Therapeutic Interventions/Progress Updates:     Pt sitting EOB to start - requesting assistance in getting dressed and bathed at the sink.   Sit<>stand from EOB with supervision using bed rail for support.  Stand<>pivot transfer with supervision to w/c and then wheeled sinkside. Pt completed sponge bath at the sink - needing assist only for washing his back and underarms. Able to undress and dress himself with minA for threading pants. Able to stand at the sink for short periods of time for pulling pants over hips and for washing periarea.   Pt propelled himself ~78ft in w/c using hemi technique due to LLE/LUE pain, supervision. Transported remaining distance to main rehab gym for time.  Pt requesting to "stretch" his LLE prior to mobility. Gentle hamstring and nerve glides on LLE to tolerance with patient sitting in w/c. Used moist heat packs on LLE & LUE to loosen and manage pain. Sit<>Stand to RW with supervision and ambulates short distance, ~76ft, with supervision and RW - step-to pattern with foot slap and limited weight shift to LLE. Difficulty gripping RW on LUE and gait distances limited by LUE > LLE pain.   Returned to  his room and patient agreeable to stay sitting in w/c. Wound vac plugged in, call bell in reach, all needs met. LPN notified of patient's request for pain Rx via secure chat.    Therapy Documentation Precautions:  Precautions Precautions: Fall, Other (comment) Precaution Comments: L forearm in dressing from fasciotomies and closure, no ROM restrictions Required Braces or Orthoses: Other Brace Other Brace: foot up brace for L foot Restrictions Weight Bearing Restrictions: Yes LUE Weight Bearing: Partial weight bearing LLE Weight Bearing: Partial weight bearing LLE Partial Weight Bearing Percentage or Pounds: N/A Other Position/Activity Restrictions: no ROM restrictions   Therapy/Group: Individual Therapy  Alger Simons 04/20/2022, 7:41 AM

## 2022-04-21 ENCOUNTER — Encounter (HOSPITAL_COMMUNITY): Payer: Self-pay | Admitting: Plastic Surgery

## 2022-04-21 ENCOUNTER — Other Ambulatory Visit (HOSPITAL_COMMUNITY): Payer: Self-pay

## 2022-04-21 DIAGNOSIS — F411 Generalized anxiety disorder: Secondary | ICD-10-CM

## 2022-04-21 DIAGNOSIS — F321 Major depressive disorder, single episode, moderate: Secondary | ICD-10-CM

## 2022-04-21 LAB — BASIC METABOLIC PANEL
Anion gap: 8 (ref 5–15)
BUN: 26 mg/dL — ABNORMAL HIGH (ref 6–20)
CO2: 26 mmol/L (ref 22–32)
Calcium: 9.2 mg/dL (ref 8.9–10.3)
Chloride: 102 mmol/L (ref 98–111)
Creatinine, Ser: 1.38 mg/dL — ABNORMAL HIGH (ref 0.61–1.24)
GFR, Estimated: >60 mL/min (ref >60)
Glucose, Bld: 93 mg/dL (ref 70–99)
Potassium: 4.4 mmol/L (ref 3.5–5.1)
Sodium: 136 mmol/L (ref 135–145)

## 2022-04-21 LAB — CBC
HCT: 32.8 % — ABNORMAL LOW (ref 39.0–52.0)
Hemoglobin: 10.4 g/dL — ABNORMAL LOW (ref 13.0–17.0)
MCH: 31 pg (ref 26.0–34.0)
MCHC: 31.7 g/dL (ref 30.0–36.0)
MCV: 97.6 fL (ref 80.0–100.0)
Platelets: 226 10*3/uL (ref 150–400)
RBC: 3.36 MIL/uL — ABNORMAL LOW (ref 4.22–5.81)
RDW: 14.4 % (ref 11.5–15.5)
WBC: 5.4 10*3/uL (ref 4.0–10.5)
nRBC: 0 % (ref 0.0–0.2)

## 2022-04-21 MED ORDER — ACETAMINOPHEN 325 MG PO TABS: 325.0000 mg | ORAL_TABLET | ORAL | Status: DC | PRN

## 2022-04-21 MED ORDER — ASPIRIN 81 MG PO TBEC
81.0000 mg | DELAYED_RELEASE_TABLET | Freq: Every day | ORAL | 0 refills | Status: DC
  Filled 2022-04-21: qty 30, 30d supply, fill #0

## 2022-04-21 MED ORDER — CALCIUM POLYCARBOPHIL 625 MG PO TABS
625.0000 mg | ORAL_TABLET | Freq: Every day | ORAL | 0 refills | Status: DC
  Filled 2022-04-21: qty 30, 30d supply, fill #0

## 2022-04-21 MED ORDER — OXYCODONE HCL 5 MG PO TABS: 10.0000 mg | ORAL_TABLET | Freq: Four times a day (QID) | ORAL | Status: DC | PRN

## 2022-04-21 MED ORDER — POLYETHYLENE GLYCOL 3350 17 G PO PACK: 17.0000 g | PACK | Freq: Two times a day (BID) | ORAL | 0 refills | Status: DC

## 2022-04-21 MED ORDER — PREGABALIN 150 MG PO CAPS
150.0000 mg | ORAL_CAPSULE | Freq: Three times a day (TID) | ORAL | 0 refills | Status: DC
  Filled 2022-04-21: qty 90, 30d supply, fill #0

## 2022-04-21 MED ORDER — HYDRALAZINE HCL 25 MG PO TABS
25.0000 mg | ORAL_TABLET | Freq: Three times a day (TID) | ORAL | 0 refills | Status: DC
  Filled 2022-04-21: qty 90, 30d supply, fill #0

## 2022-04-21 MED ORDER — OXYCODONE HCL 10 MG PO TABS
10.0000 mg | ORAL_TABLET | Freq: Four times a day (QID) | ORAL | 0 refills | Status: DC | PRN
  Filled 2022-04-21: qty 20, 5d supply, fill #0

## 2022-04-21 MED ORDER — LIDOCAINE 5 % EX PTCH: 1.0000 | MEDICATED_PATCH | Freq: Every day | CUTANEOUS | 0 refills | Status: DC

## 2022-04-21 MED ORDER — CYCLOBENZAPRINE HCL 10 MG PO TABS
10.0000 mg | ORAL_TABLET | Freq: Three times a day (TID) | ORAL | 0 refills | Status: DC | PRN
  Filled 2022-04-21: qty 90, 30d supply, fill #0

## 2022-04-21 MED ORDER — ADULT MULTIVITAMIN W/MINERALS CH
1.0000 | ORAL_TABLET | Freq: Every day | ORAL | 0 refills | Status: DC
  Filled 2022-04-21: qty 30, 30d supply, fill #0

## 2022-04-21 MED ORDER — TRAZODONE HCL 50 MG PO TABS
50.0000 mg | ORAL_TABLET | Freq: Every day | ORAL | 0 refills | Status: DC
  Filled 2022-04-21: qty 30, 30d supply, fill #0

## 2022-04-21 MED ORDER — DULOXETINE HCL 60 MG PO CPEP
60.0000 mg | ORAL_CAPSULE | Freq: Every day | ORAL | 0 refills | Status: DC
  Filled 2022-04-21: qty 30, 30d supply, fill #0

## 2022-04-21 MED ORDER — AMLODIPINE BESYLATE 5 MG PO TABS
5.0000 mg | ORAL_TABLET | Freq: Every day | ORAL | 0 refills | Status: DC
  Filled 2022-04-21: qty 30, 30d supply, fill #0

## 2022-04-21 MED ORDER — DOCUSATE SODIUM 100 MG PO CAPS
100.0000 mg | ORAL_CAPSULE | Freq: Two times a day (BID) | ORAL | 1 refills | Status: DC
  Filled 2022-04-21: qty 20, 10d supply, fill #0

## 2022-04-21 MED ORDER — NORTRIPTYLINE HCL 50 MG PO CAPS
50.0000 mg | ORAL_CAPSULE | Freq: Every day | ORAL | 0 refills | Status: DC
  Filled 2022-04-21: qty 30, 30d supply, fill #0

## 2022-04-21 MED ORDER — METHOCARBAMOL 500 MG PO TABS
500.0000 mg | ORAL_TABLET | Freq: Four times a day (QID) | ORAL | 0 refills | Status: DC | PRN
  Filled 2022-04-21: qty 90, 23d supply, fill #0

## 2022-04-21 NOTE — Progress Notes (Signed)
Patient discharged off of unit with all belongings. Discharge papers/instructions explained by physician assistant to family. Patient and family have no further questions at time of discharge. No complications noted at this time.  Taneasha Fuqua L Phelix Fudala  

## 2022-04-21 NOTE — Progress Notes (Addendum)
Patient ID: [name] [name], male   DOB: [birth year], 42 y.o.   MRN: [id number]  SW met with pt and pt sister [name] [name] in room to review discharge. He stated he spoke with someone this weekend to adjust the height of the w/c. SW shared will have to speak with vendor about making adjustments.   SW updated [name]/[name] with [employer] to discuss about w/c adjustments. Liaison came to unit to adjust w/c.   SW provided pt and pt sister with updated information in d/c instructions for psych. Referral to be made for outpatient psych.   SW faxed outpatient PT/OT/SLP referral to [facility] ([id number]:[phone]/f:[id number])/ SW faxed PCS referral with clinicals to [place] [facility] [phone].  [name], MSW, [place] Office: [phone] Cell: [phone] Fax: [phone]

## 2022-04-21 NOTE — Plan of Care (Signed)
  Problem: Consults Goal: RH STROKE PATIENT EDUCATION Description: See Patient Education module for education specifics  Outcome: Completed/Met   Problem: RH BOWEL ELIMINATION Goal: RH STG MANAGE BOWEL WITH ASSISTANCE Description: STG Manage Bowel with mod I  Assistance. Outcome: Completed/Met Goal: RH STG MANAGE BOWEL W/MEDICATION W/ASSISTANCE Description: STG Manage Bowel with Medication with mod I Assistance. Outcome: Completed/Met   Problem: RH SKIN INTEGRITY Goal: RH STG SKIN FREE OF INFECTION/BREAKDOWN Description: W min assist Outcome: Completed/Met Goal: RH STG MAINTAIN SKIN INTEGRITY WITH ASSISTANCE Description: STG Maintain Skin Integrity With min Assistance. Outcome: Completed/Met Goal: RH STG ABLE TO PERFORM INCISION/WOUND CARE W/ASSISTANCE Description: STG Able To Perform Incision/Wound Care With min Assistance. Outcome: Completed/Met   Problem: RH SAFETY Goal: RH STG ADHERE TO SAFETY PRECAUTIONS W/ASSISTANCE/DEVICE Description: STG Adhere to Safety Precautions With cues Assistance/Device. Outcome: Completed/Met   Problem: RH PAIN MANAGEMENT Goal: RH STG PAIN MANAGED AT OR BELOW PT'S PAIN GOAL Description: < 4 with prns Outcome: Completed/Met   Problem: RH KNOWLEDGE DEFICIT Goal: RH STG INCREASE KNOWLEDGE OF HYPERTENSION Description: Patient and sister will be able to manage HTN with medications and dietary modifications using educational handouts independently Outcome: Completed/Met Goal: RH STG INCREASE KNOWLEGDE OF HYPERLIPIDEMIA Description: Patient and sister will be able to manage HLD with medications and dietary modifications using educational handouts independently Outcome: Completed/Met Goal: RH STG INCREASE KNOWLEDGE OF STROKE PROPHYLAXIS Description: Patient and sister will be able to manage secondary risks with medications and dietary modifications using educational handouts independently Outcome: Completed/Met   Problem: Education: Goal:  Knowledge of disease or condition will improve Outcome: Completed/Met Goal: Knowledge of secondary prevention will improve (MUST DOCUMENT ALL) Outcome: Completed/Met Goal: Knowledge of patient specific risk factors will improve [place] N/A or DELETE if not current risk factor) Outcome: Completed/Met   Problem: Ischemic Stroke/TIA Tissue Perfusion: Goal: Complications of ischemic stroke/TIA will be minimized Outcome: Completed/Met   Problem: Coping: Goal: Will verbalize positive feelings about self Outcome: Completed/Met Goal: Will identify appropriate support needs Outcome: Completed/Met   Problem: Health Behavior/Discharge Planning: Goal: Ability to manage health-related needs will improve Outcome: Completed/Met Goal: Goals will be collaboratively established with patient/family Outcome: Completed/Met   Problem: Self-Care: Goal: Ability to participate in self-care as condition permits will improve Outcome: Completed/Met Goal: Verbalization of feelings and concerns over difficulty with self-care will improve Outcome: Completed/Met Goal: Ability to communicate needs accurately will improve Outcome: Completed/Met   Problem: Nutrition: Goal: Risk of aspiration will decrease Outcome: Completed/Met Goal: Dietary intake will improve Outcome: Completed/Met

## 2022-04-21 NOTE — Progress Notes (Signed)
Speech Language Pathology Discharge Summary  Patient Details  Name: [name] [name] MRN: [id number] Date of Birth: 1981/02/19  Date of Discharge from [facility] service:April 21, 2022  Patient has met 6 of 6 long term goals.  Patient to discharge at overall Supervision level.   Reasons goals not met: N/A  Clinical Impression/Discharge Summary: Patient has made excellent gains and has met 6 of 6 LTGs this admission. Currently, patient is consuming regular textures with thin liquids with minimal overt s/s of aspiration with overall Mod I for use of swallowing compensatory strategies. Patient demonstrates improved cognitive functioning and requires overall supervision-Min A verbal and visual cues to complete functional and familiar tasks safely in regards to attention, functional problem solving, recall with use of strategies, and awareness. Patient and family education is complete and patient will discharge home with 24 hour supervision from family. Patient would benefit from f/u SLP services to maximize his cognitive functioning and overall functional independence.   Care Partner:  Caregiver Able to Provide Assistance: Yes  Type of Caregiver Assistance: Physical;Cognitive  Recommendation:  Outpatient SLP;24 hour supervision/assistance  Rationale for SLP Follow Up: Reduce caregiver burden;Maximize cognitive function and independence   Equipment: N/A   Reasons for discharge: Discharged from hospital;Treatment goals met   Patient/Family Agrees with Progress Made and Goals Achieved: Yes    [name] [name], [place] 04/21/2022, 6:44 AM
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
## 2022-04-21 NOTE — Plan of Care (Signed)
  Problem: RH Swallowing Goal: LTG Patient will consume least restrictive diet using compensatory strategies with assistance (SLP) Description: LTG:  Patient will consume least restrictive diet using compensatory strategies with assistance (SLP) Outcome: Completed/Met   Problem: RH Attention Goal: LTG Patient will demonstrate this level of attention during functional activites (SLP) Description: LTG:  Patient will will demonstrate this level of attention during functional activites (SLP) Outcome: Completed/Met   Problem: RH Awareness Goal: LTG: Patient will demonstrate awareness during functional activites type of (SLP) Description: LTG: Patient will demonstrate awareness during functional activites type of (SLP) Outcome: Completed/Met   Problem: RH Problem Solving Goal: LTG Patient will demonstrate problem solving for (SLP) Description: LTG:  Patient will demonstrate problem solving for basic/complex daily situations with cues  (SLP) Outcome: Completed/Met   Problem: RH Memory Goal: LTG Patient will demonstrate ability for day to day (SLP) Description: LTG:   Patient will demonstrate ability for day to day recall/carryover during cognitive/linguistic activities with assist  (SLP) Outcome: Completed/Met Goal: LTG Patient will use memory compensatory aids to (SLP) Description: LTG:  Patient will use memory compensatory aids to recall biographical/new, daily complex information with cues (SLP) Outcome: Completed/Met
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
## 2022-04-21 NOTE — Progress Notes (Addendum)
Inpatient Rehabilitation Care Coordinator Discharge Note   Patient Details  Name: [name] [name] MRN: [id number] Date of Birth: 07-Jul-1980   Discharge location: D/c to home with his sister  Length of Stay: 12 days  Discharge activity level: Supervision  Home/community participation: Limited  Patient response PV:XYIAXK Literacy - How often do you need to have someone help you when you read instructions, pamphlets, or other written material from your doctor or pharmacy?: Never  Patient response PV:VZSMOL Isolation - How often do you feel lonely or isolated from those around you?: Always  Services provided included: RD, MD, PT, OT, RN, Pharmacy, Neuropsych, SW, TR, CM, SLP  Financial Services:  Charity fundraiser Utilized: Private Insurance [place] Medicaid [facility] [place]  Choices offered to/list presented to: pt and his sister [name] [name]  Follow-up services arranged:  Outpatient, DME    Outpatient Servicies: [facility] for outpatient PT/OT/SLP DME : [facility] for RW, w/c, TTB, and DABSC- to be shipped to the home as out of stock. [facility] for wound vac    Patient response to transportation need: Is the patient able to respond to transportation needs?: Yes In the past 12 months, has lack of transportation kept you from medical appointments or from getting medications?: No In the past 12 months, has lack of transportation kept you from meetings, work, or from getting things needed for daily living?: No   Comments (or additional information): Psych information provided to pt, and referral to be made for outpatient psych.   Patient/Family verbalized understanding of follow-up arrangements:  Yes  Individual responsible for coordination of the follow-up plan: contact pt sister [name] [name] [phone]  Confirmed correct DME delivered: Rana Snare 04/21/2022    Rana Snare

## 2022-04-21 NOTE — Progress Notes (Signed)
Inpatient Rehabilitation Discharge Medication Review by a Pharmacist  A complete drug regimen review was completed for this patient to identify any potential clinically significant medication issues.  High Risk Drug Classes Is patient taking? Indication by Medication  Antipsychotic No   Anticoagulant No   Antibiotic No   Opioid Yes Oxycodone - pain  Antiplatelet Yes Aspirin - CVA  Hypoglycemics/insulin No   Vasoactive Medication Yes Amlodipine, hydralazine - HTN  Chemotherapy No   Other Yes Lyrica, lidocaine patch, duloxetine, Tylenol - pain Trazodone, nortriptyline  - sleep Miralax, docusate - constipation Flexeril, Robaxin - muscle spasms     Type of Medication Issue Identified Description of Issue Recommendation(s)  Drug Interaction(s) (clinically significant)     Duplicate Therapy     Allergy     No Medication Administration End Date     Incorrect Dose     Additional Drug Therapy Needed     Significant med changes from prior encounter (inform family/care partners about these prior to discharge). No medications PTA   Other       Clinically significant medication issues were identified that warrant physician communication and completion of prescribed/recommended actions by midnight of the next day:  No  Name of provider notified for urgent issues identified:   Provider Method of Notification:     Pharmacist comments:   Time spent performing this drug regimen review (minutes):  [place], PharmD, BCPS Clinical Pharmacist Clinical phone for 04/21/2022 is [phone] 04/21/2022 7:18 AM

## 2022-04-23 NOTE — Progress Notes (Signed)
Physical Therapy Discharge Summary  Patient Details  Name: [name] [name] MRN: [id number] Date of Birth: 12-11-1980  Date of Discharge from PT service:April 20, 2022  Patient has met 11 of 12 long term goals due to improved activity tolerance, improved balance, decreased pain, and ability to compensate for deficits.  Patient to discharge at a wheelchair level Supervision.   Patient's care partner is independent to provide the necessary physical assistance at discharge.  Reasons goals not met: Stair goal set to [place] but patient unable to complete 2/2 pain  Recommendation:  Patient will benefit from ongoing skilled PT services in outpatient setting to continue to advance safe functional mobility, address ongoing impairments in weakness, balance, gait, pain control, and minimize fall risk.  Equipment: RW, manual w/c, TTB  Reasons for discharge: treatment goals met and discharge from hospital  Patient/family agrees with progress made and goals achieved: Yes  PT Discharge Precautions/Restrictions Precautions Precautions: Fall Precaution Comments: L forearm in dressing from fasciotomies and closure, no ROM restrictions Required Braces or Orthoses: Other Brace Other Brace: foot up brace for L foot Restrictions Weight Bearing Restrictions: No Pain Interference Pain Interference Pain Effect on Sleep: 4. Almost constantly Pain Interference with Therapy Activities: 4. Almost constantly Pain Interference with Day-to-Day Activities: 4. Almost constantly Vision/Perception  Vision - History Ability to See in Adequate Light: 0 Adequate Perception Perception: Within Functional Limits Praxis Praxis: Intact  Cognition Overall Cognitive Status: Within Functional Limits for tasks assessed Arousal/Alertness: Awake/alert Orientation Level: Oriented X4 Attention: Sustained Sustained Attention: Appears intact Memory: Appears intact Awareness: Appears intact Problem Solving:  Appears intact Safety/Judgment: Appears intact Sensation Sensation Light Touch: Impaired by gross assessment Coordination Gross Motor Movements are Fluid and Coordinated: No Coordination and Movement Description: LUE/LE impaired d/t pain and hypersensitivity d/t rhabdo, LUE Improved since eval but remains limited in strength of grip Heel Shin Test: unable to perform on the LLE due to pain Motor  Motor Motor: Other (comment) Motor - Skilled Clinical Observations: foot drop on the LLE. limited assessment due ot severe pain in the distal LLE  Mobility Bed Mobility Bed Mobility: Supine to Sit;Sit to Supine;Rolling Right;Rolling Left Rolling Right: Independent Rolling Left: Independent Supine to Sit: Independent Sit to Supine: Independent Transfers Transfers: Sit to Stand;Stand to Sit;Stand Pivot Transfers Sit to Stand: Supervision/Verbal cueing Stand to Sit: Supervision/Verbal cueing Stand Pivot Transfers: Supervision/Verbal cueing Stand Pivot Transfer Details: Verbal cues for precautions/safety;Verbal cues for technique Transfer (Assistive device): Rolling walker Locomotion  Gait Ambulation: Yes Gait Assistance: Contact Guard/Touching assist Gait Distance (Feet): 10 Feet Assistive device: Rolling walker Gait Assistance Details: Verbal cues for gait pattern;Verbal cues for precautions/safety;Verbal cues for safe use of DME/AE Gait Gait: Yes Gait Pattern: Impaired Gait Pattern: Antalgic;Decreased step length - left;Decreased stance time - left;Decreased hip/knee flexion - left;Decreased dorsiflexion - left;Decreased weight shift to left Stairs / Additional Locomotion Stairs: No Wheelchair Mobility Wheelchair Mobility: Yes Wheelchair Assistance: Independent with assistive device Wheelchair Propulsion: Right upper extremity;Right lower extremity Wheelchair Parts Management: Needs assistance Distance: 150'  Trunk/Postural Assessment  Cervical Assessment Cervical Assessment:  Within Functional Limits Thoracic Assessment Thoracic Assessment: Within Functional Limits Lumbar Assessment Lumbar Assessment: Within Functional Limits Postural Control Postural Control: Deficits on evaluation (d/t pain)  Balance Balance Balance Assessed: Yes Dynamic Sitting Balance Dynamic Sitting - Balance Support: Feet supported;No upper extremity supported Dynamic Sitting - Level of Assistance: 7: Independent Static Standing Balance Static Standing - Balance Support: Bilateral upper extremity supported Static Standing - Level of Assistance: 6: Modified  independent (Device/Increase time) Dynamic Standing Balance Dynamic Standing - Balance Support: Bilateral upper extremity supported Dynamic Standing - Level of Assistance: 5: Stand by assistance Extremity Assessment      RLE Assessment RLE Assessment: Within Functional Limits LLE Assessment LLE Assessment: Exceptions to [facility] General Strength Comments: 0/5 ankle DF/PF. Grossly 3/5 but pain limiting accurate assessment   Alger Simons 04/23/2022, 12:55 PM

## 2022-04-25 ENCOUNTER — Ambulatory Visit: Payer: Medicaid Other | Admitting: Physician Assistant

## 2022-04-25 DIAGNOSIS — S41102D Unspecified open wound of left upper arm, subsequent encounter: Secondary | ICD-10-CM | POA: Diagnosis not present
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
## 2022-04-28 ENCOUNTER — Telehealth: Payer: Self-pay | Admitting: Physical Medicine & Rehabilitation

## 2022-04-28 NOTE — Telephone Encounter (Signed)
Patient requesting prescription for pain on legs and arms.

## 2022-04-29 MED ORDER — OXYCODONE HCL 10 MG PO TABS: 10.0000 mg | ORAL_TABLET | Freq: Four times a day (QID) | ORAL | 0 refills | Status: DC | PRN

## 2022-04-29 NOTE — Telephone Encounter (Signed)
Talked to pt and advised Rx was sent in for oxycodone

## 2022-04-29 NOTE — Telephone Encounter (Signed)
I prescribed him another 30 oxycodone to get him to next visit with me. He needs a UDS at that [place]

## 2022-04-29 NOTE — Telephone Encounter (Signed)
[name] called in asking if he can get a refill on his oxycodone to hold him over until his appointment with you on 05/07/22 due to him being in extreme pain

## 2022-04-30 ENCOUNTER — Telehealth: Payer: Self-pay

## 2022-04-30 ENCOUNTER — Telehealth: Payer: Self-pay | Admitting: *Deleted

## 2022-04-30 NOTE — Telephone Encounter (Signed)
PA for Oxycodone 10 MG sent in Cover My Meds

## 2022-04-30 NOTE — Telephone Encounter (Signed)
Ordered CPT auth requirements for [employer] MCD: No auth required for case. [id number]:   [id number]:   [id number]:   [id number]:   [id number]:

## 2022-05-01 ENCOUNTER — Ambulatory Visit (INDEPENDENT_AMBULATORY_CARE_PROVIDER_SITE_OTHER): Payer: Medicaid Other | Admitting: Physician Assistant

## 2022-05-01 VITALS — BP 109/78 | HR 109

## 2022-05-01 DIAGNOSIS — S41102D Unspecified open wound of left upper arm, subsequent encounter: Secondary | ICD-10-CM | POA: Diagnosis not present
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
## 2022-05-02 ENCOUNTER — Telehealth: Payer: Self-pay | Admitting: Physical Medicine & Rehabilitation

## 2022-05-02 MED ORDER — OXYCODONE HCL 5 MG PO TABS: 5.0000 mg | ORAL_TABLET | Freq: Four times a day (QID) | ORAL | 0 refills | Status: DC | PRN

## 2022-05-02 NOTE — Telephone Encounter (Signed)
Patient called requesting status of prior authorization for his medication sent in , left voicemail at 12:35pm

## 2022-05-02 NOTE — Telephone Encounter (Signed)
[name] [name] insurance will not pay of a 30 day supply of Oxycodone 10 MG until May 30, 2022. Per patient he has not had the Oxycodone since discharge. The Tramadol alone is not helping his pain.   Patient has been advised to call his insurance company [phone]) to discuss the matter. Per pharmacy the MME is to high needs to be less than 30. Dr. Naaman Plummer will be informed.    Please advise. Call back phone for [name] [name] is  [phone].   Filled  Written  ID  Drug  QTY  Days  Prescriber  RX #  Dispenser  Refill  Daily Dose*  Pymt Type  PMP  04/28/2022 04/28/2022 3  Tramadol Hcl 50 Mg Tablet 20.00 5 Ro Das [id number] [facility] (0019) 0/0 40.00 MME Medicaid [place] 04/21/2022 04/21/2022 2  Oxycodone Hcl (Ir) 10 Mg Tab 20.00 5 Sa Set [id number] [facility] (5648) 0/0 60.00 MME Other 

## 2022-05-02 NOTE — Telephone Encounter (Signed)
Called pt. Wrote rx for 5mg  oxycodone. Pt requested [facility], so I sent rx there after initially sending to [place]. Pt said he talked with insurance company and that he needed prior auth for oxycodone 10mg . Please follow up with him on Monday. thx

## 2022-05-05 ENCOUNTER — Ambulatory Visit: Payer: Medicaid Other | Admitting: Internal Medicine

## 2022-05-05 ENCOUNTER — Ambulatory Visit (HOSPITAL_COMMUNITY)
Admission: RE | Admit: 2022-05-05 | Discharge: 2022-05-05 | Disposition: A | Discharge status: Home / Self Care | Payer: Medicaid Other | Source: Ambulatory Visit | Attending: Internal Medicine | Admitting: Internal Medicine

## 2022-05-05 ENCOUNTER — Encounter: Payer: Self-pay | Admitting: Internal Medicine

## 2022-05-05 VITALS — BP 118/60 | HR 115 | Ht 66.0 in | Wt 200.0 lb

## 2022-05-05 DIAGNOSIS — M25572 Pain in left ankle and joints of left foot: Secondary | ICD-10-CM | POA: Diagnosis not present

## 2022-05-05 DIAGNOSIS — M79672 Pain in left foot: Secondary | ICD-10-CM | POA: Insufficient documentation

## 2022-05-05 DIAGNOSIS — M545 Low back pain, unspecified: Secondary | ICD-10-CM | POA: Diagnosis not present

## 2022-05-05 DIAGNOSIS — I639 Cerebral infarction, unspecified: Secondary | ICD-10-CM | POA: Diagnosis not present

## 2022-05-05 DIAGNOSIS — Z13220 Encounter for screening for lipoid disorders: Secondary | ICD-10-CM | POA: Diagnosis not present

## 2022-05-05 DIAGNOSIS — Z79899 Other long term (current) drug therapy: Secondary | ICD-10-CM | POA: Diagnosis not present

## 2022-05-05 DIAGNOSIS — Z0001 Encounter for general adult medical examination with abnormal findings: Secondary | ICD-10-CM

## 2022-05-05 NOTE — Assessment & Plan Note (Signed)
Patient has concern for left foot pain after hitting ankle after fall. Review of recent admission shows patient has had foot drop and decreased sensation below left ankle. He has severe shooting pain after touching foot or ankle which is thought to be from sciatic nerve damage. He has follow up with neurology for EMG.  He has swelling of left foot and ankle. Plan to xray foot and ankle to rule out acute fracture from fall.

## 2022-05-05 NOTE — Patient Instructions (Signed)
Thank you for trusting me with your care. To recap, today we discussed the following:   general adult medical examination  - CMP14+EGFR - TSH - VITAMIN D 25 Hydroxy (Vit-D Deficiency, Fractures) - CBC with Differential/Platelet - Lipid panel - Hemoglobin A1C   Acute left-sided low back pain without sciatica - DG Lumbar Spine 2-3 Views   Left foot pain - DG Foot Complete Left - DG Ankle Complete Left  Please go by lab and go to [facility] to have xrays completed. I will follow up with results of these test and labs.
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
## 2022-05-05 NOTE — Telephone Encounter (Signed)
I spoke with [facility] he was able to pick up the Oxycodone 5 MG without a PA on 05/03/2022.

## 2022-05-05 NOTE — Assessment & Plan Note (Signed)
Acute pain in lower back after fall - DG Lumbar Spine 2-3 Views

## 2022-05-05 NOTE — Assessment & Plan Note (Signed)
Patient here to establish care. Will check basic labs below.  - CMP14+EGFR - TSH - VITAMIN D 25 Hydroxy (Vit-D Deficiency, Fractures) - CBC with Differential/Platelet - Lipid panel - Hemoglobin A1C

## 2022-05-06 DIAGNOSIS — M79605 Pain in left leg: Secondary | ICD-10-CM | POA: Diagnosis not present

## 2022-05-06 DIAGNOSIS — M792 Neuralgia and neuritis, unspecified: Secondary | ICD-10-CM | POA: Diagnosis not present

## 2022-05-07 ENCOUNTER — Other Ambulatory Visit: Payer: Self-pay | Admitting: Physical Medicine & Rehabilitation

## 2022-05-07 ENCOUNTER — Telehealth: Payer: Self-pay | Admitting: *Deleted

## 2022-05-07 ENCOUNTER — Ambulatory Visit
Admission: RE | Admit: 2022-05-07 | Discharge: 2022-05-07 | Disposition: A | Discharge status: Home / Self Care | Payer: Medicaid Other | Source: Ambulatory Visit | Attending: Physical Medicine & Rehabilitation | Admitting: Physical Medicine & Rehabilitation

## 2022-05-07 ENCOUNTER — Encounter: Payer: Self-pay | Admitting: Physical Medicine & Rehabilitation

## 2022-05-07 ENCOUNTER — Encounter: Payer: Medicaid Other | Attending: Physical Medicine & Rehabilitation | Admitting: Physical Medicine & Rehabilitation

## 2022-05-07 VITALS — BP 112/70 | HR 110 | Ht 66.0 in

## 2022-05-07 DIAGNOSIS — M79642 Pain in left hand: Secondary | ICD-10-CM

## 2022-05-07 DIAGNOSIS — M79672 Pain in left foot: Secondary | ICD-10-CM

## 2022-05-07 DIAGNOSIS — F1910 Other psychoactive substance abuse, uncomplicated: Secondary | ICD-10-CM

## 2022-05-07 DIAGNOSIS — Z5181 Encounter for therapeutic drug level monitoring: Secondary | ICD-10-CM | POA: Diagnosis not present

## 2022-05-07 DIAGNOSIS — S7402XS Injury of sciatic nerve at hip and thigh level, left leg, sequela: Secondary | ICD-10-CM

## 2022-05-07 DIAGNOSIS — M7989 Other specified soft tissue disorders: Secondary | ICD-10-CM | POA: Diagnosis not present

## 2022-05-07 DIAGNOSIS — M25552 Pain in left hip: Secondary | ICD-10-CM

## 2022-05-07 DIAGNOSIS — S7400XA Injury of sciatic nerve at hip and thigh level, unspecified leg, initial encounter: Secondary | ICD-10-CM | POA: Insufficient documentation

## 2022-05-07 DIAGNOSIS — G894 Chronic pain syndrome: Secondary | ICD-10-CM

## 2022-05-07 DIAGNOSIS — T796XXS Traumatic ischemia of muscle, sequela: Secondary | ICD-10-CM

## 2022-05-07 DIAGNOSIS — Z79891 Long term (current) use of opiate analgesic: Secondary | ICD-10-CM

## 2022-05-07 LAB — LIPID PANEL
Chol/HDL Ratio: 4.8 ratio (ref 0.0–5.0)
Cholesterol, Total: 149 mg/dL (ref 100–199)
HDL: 31 mg/dL — ABNORMAL LOW (ref >39)
LDL Chol Calc (NIH): 71 mg/dL (ref 0–99)
Triglycerides: 288 mg/dL — ABNORMAL HIGH (ref 0–149)
VLDL Cholesterol Cal: 47 mg/dL — ABNORMAL HIGH (ref 5–40)

## 2022-05-07 LAB — CBC WITH DIFFERENTIAL/PLATELET
Basophils Absolute: 0 10*3/uL (ref 0.0–0.2)
Basos: 1 %
EOS (ABSOLUTE): 0.4 10*3/uL (ref 0.0–0.4)
Eos: 8 %
Hematocrit: 32.8 % — ABNORMAL LOW (ref 37.5–51.0)
Hemoglobin: 11 g/dL — ABNORMAL LOW (ref 13.0–17.7)
Immature Grans (Abs): 0 10*3/uL (ref 0.0–0.1)
Immature Granulocytes: 1 %
Lymphocytes Absolute: 1.6 10*3/uL (ref 0.7–3.1)
Lymphs: 35 %
MCH: 30.5 pg (ref 26.6–33.0)
MCHC: 33.5 g/dL (ref 31.5–35.7)
MCV: 91 fL (ref 79–97)
Monocytes Absolute: 0.3 10*3/uL (ref 0.1–0.9)
Monocytes: 7 %
Neutrophils Absolute: 2.3 10*3/uL (ref 1.4–7.0)
Neutrophils: 48 %
Platelets: 178 10*3/uL (ref 150–450)
RBC: 3.61 x10E6/uL — ABNORMAL LOW (ref 4.14–5.80)
RDW: 13.2 % (ref 11.6–15.4)
WBC: 4.7 10*3/uL (ref 3.4–10.8)

## 2022-05-07 LAB — VITAMIN D 25 HYDROXY (VIT D DEFICIENCY, FRACTURES): Vit D, 25-Hydroxy: 38 ng/mL (ref 30.0–100.0)

## 2022-05-07 LAB — HEMOGLOBIN A1C
Est. average glucose Bld gHb Est-mCnc: <74 mg/dL
Hgb A1c MFr Bld: <4.2 % — ABNORMAL LOW (ref 4.8–5.6)

## 2022-05-07 LAB — CMP14+EGFR
ALT: 18 IU/L (ref 0–44)
AST: 24 IU/L (ref 0–40)
Albumin/Globulin Ratio: 2.3 — ABNORMAL HIGH (ref 1.2–2.2)
Albumin: 4.3 g/dL (ref 4.1–5.1)
Alkaline Phosphatase: 56 IU/L (ref 44–121)
BUN/Creatinine Ratio: 6 — ABNORMAL LOW (ref 9–20)
BUN: 6 mg/dL (ref 6–24)
Bilirubin Total: 0.3 mg/dL (ref 0.0–1.2)
CO2: 20 mmol/L (ref 20–29)
Calcium: 8.9 mg/dL (ref 8.7–10.2)
Chloride: 105 mmol/L (ref 96–106)
Creatinine, Ser: 0.94 mg/dL (ref 0.76–1.27)
Globulin, Total: 1.9 g/dL (ref 1.5–4.5)
Glucose: 88 mg/dL (ref 70–99)
Potassium: 4 mmol/L (ref 3.5–5.2)
Sodium: 141 mmol/L (ref 134–144)
Total Protein: 6.2 g/dL (ref 6.0–8.5)
eGFR: 104 mL/min/{1.73_m2} (ref >59)

## 2022-05-07 LAB — TSH: TSH: 1.01 u[IU]/mL (ref 0.450–4.500)

## 2022-05-07 MED ORDER — OXYCODONE HCL 5 MG PO TABS: 5.0000 mg | ORAL_TABLET | Freq: Four times a day (QID) | ORAL | 0 refills | Status: DC | PRN

## 2022-05-07 MED ORDER — PREGABALIN 200 MG PO CAPS: 200.0000 mg | ORAL_CAPSULE | Freq: Three times a day (TID) | ORAL | 4 refills | Status: AC

## 2022-05-07 NOTE — Patient Instructions (Addendum)
ALWAYS FEEL FREE TO CALL OUR OFFICE WITH ANY PROBLEMS OR QUESTIONS ([phone])  **PLEASE NOTE** ALL MEDICATION REFILL REQUESTS (INCLUDING CONTROLLED SUBSTANCES) NEED TO BE MADE AT LEAST 7 DAYS PRIOR TO REFILL BEING DUE. ANY REFILL REQUESTS INSIDE THAT TIME FRAME MAY RESULT IN DELAYS IN RECEIVING YOUR PRESCRIPTION.                   PLEASE USE THE 150MG  LYRICA IN PLACE OF THE 200MG  MID-DAY DOSE UNTIL THE 150MG  CAPS ARE GONE.   PLEASE WORK TOWARD TO USING ONLY 5MG  OXYCODONE AT A TIME. I WILL BE TAPERING THE DOSE.   FOR YOUR BOWELS ---OVER THE COUNTER  -SENOKOT-S (GENERIC)---TAKE 2 AT BEDTIME

## 2022-05-07 NOTE — Progress Notes (Signed)
Subjective:    Patient ID: [name] [name], male    DOB: 1980-07-06, 42 y.o.   MRN: [id number]  HPI  Mr. Lenis is here in follow up of his rhabdomyolysis, sciatic nerve injury. He fell yesterday trying to close a curtain. He lost his balance and fell back against his left leg and his left arm. His left hand is now swollen and tender as is his left elbow, left hip and foot.  He is unable to put a shoe on the left foot and he can't make a fist either.  Up until then he states that he had been doing fairly well.  We have been cutting back his oxycodone.  He can definitely tell a difference in his pain when he does not have the oxycodone.  He also remains on Lyrica 150 mg 3 times daily as well as nortriptyline and Cymbalta.  He uses Robaxin as needed for spasms.  He tells me he has a behavioral health visit scheduled but he does not remember what date it is.  He does admit to some ongoing anxiety.  A lot of this centers around the things that he lost because of this accident including his job and home.  Sleep has been fair as long as he is using his medications.  He also uses trazodone at bedtime.  He denies any problems with breathing or shortness of breath.  Bowels and bladder seem to be functioning fairly well.  He remains on Colace and MiraLAX for his bowels which seems to generally work.  He denies any skin issues other than those that pertaining to his left forearm.  He has wound care follow up on Friday. ?skin graft planned next week looking at his schedule. .      Pain Inventory Average Pain 7 Pain Right Now 10 My pain is sharp, burning, tingling, and aching  LOCATION OF PAIN  Hand, Fingers, Back,Buttocks, Leg, Ankle,Toes  BOWEL Number of stools per week: 4   BLADDER  Bladder incontinence Yes     Mobility how many minutes can you walk? 5-10 ability to climb steps?  no do you drive?  no use a wheelchair Do you have any goals in this area?  yes  Function not  employed: date last employed . I need assistance with the following:  bathing, toileting, and household duties Do you have any goals in this area?  yes  Neuro/Psych tingling trouble walking spasms depression anxiety  Prior Studies Any changes since last visit?  no  Physicians involved in your care Any changes since last visit?  no   Family History  Problem Relation Age of Onset   Hypertension Other    Social History   Socioeconomic History   Marital status: Single    Spouse name: Not on file   Number of children: Not on file   Years of education: Not on file   Highest education level: Not on file  Occupational History   Not on file  Tobacco Use   Smoking status: Every Day    Packs/day: 0.50    Types: Cigarettes   Smokeless tobacco: Never  Vaping Use   Vaping Use: Never used  Substance and Sexual Activity   Alcohol use: Yes    Comment: occasion   Drug use: Yes    Types: Cocaine, Marijuana, Amphetamines    Comment: states history of use   Sexual activity: Not Currently  Other Topics Concern   Not on file  Social History Narrative  Not on file   Social Determinants of Health   Financial Resource Strain: Not on file  Food Insecurity: Not on file  Transportation Needs: Not on file  Physical Activity: Not on file  Stress: Not on file  Social Connections: Not on file   Past Surgical History:  Procedure Laterality Date   APPLICATION OF A-CELL OF EXTREMITY Left 04/17/2022   Procedure: APPLICATION OF MYRIAD OF EXTREMITY;  Surgeon: Wallace Going, DO;  Location: [place];  Service: Plastics;  Laterality: Left;   APPLICATION OF WOUND VAC Left 04/07/2022   Procedure: APPLICATION OF WOUND VAC;  Surgeon: Wallace Going, DO;  Location: [place];  Service: Plastics;  Laterality: Left;   APPLICATION OF WOUND VAC Left 04/17/2022   Procedure: WOUND VAC CHANGE;  Surgeon: Wallace Going, DO;  Location: [place];  Service: Plastics;  Laterality: Left;   DEBRIDEMENT  AND CLOSURE WOUND Left 03/27/2022   Procedure: CLOSURE of left dorsal fasciotomy wound and proximal and distal portion of the volar fasciotomy wound;  Surgeon: Leanora Cover, MD;  Location: [place];  Service: Orthopedics;  Laterality: Left;   FASCIOTOMY Left 03/20/2022   Procedure: FASCIOTOMY LEFT FOREARM, CARPAL TUNNEL RELEASE LEFT HAND;  Surgeon: Leanora Cover, MD;  Location: [place];  Service: Orthopedics;  Laterality: Left;   I & D EXTREMITY Right 11/11/2021   Procedure: IRRIGATION AND DEBRIDEMENT EXTREMITY;  Surgeon: Erle Crocker, MD;  Location: [facility];  Service: Orthopedics;  Laterality: Right;   I & D EXTREMITY Left 03/27/2022   Procedure: LEFT FOREARM IRRIGATION AND DEBRIDEMENT of fasciotomy wounds including skin subcutaneous tissues;  Surgeon: Leanora Cover, MD;  Location: [place];  Service: Orthopedics;  Laterality: Left;  96 MIN   IR FLUORO GUIDE CV LINE RIGHT  04/01/2022   IR REMOVAL TUN CV CATH W/O FL  04/09/2022   IR US GUIDE VASC ACCESS RIGHT  04/01/2022   NO PAST SURGERIES     Past Medical History:  Diagnosis Date   Chronic kidney disease    Open dislocation of right thumb 11/11/2021   Polysubstance abuse ([place]) 03/20/2022   Ht 5\' 6"  (1.676 m)   BMI 32.28 kg/m   Opioid Risk Score:   Fall Risk Score:  `1  Depression screen [facility] 2/9     05/05/2022   10:07 AM  Depression screen PH[facility] 2/9  Decreased Interest 0  Down, Depressed, Hopeless 0  PHQ - 2 Score 0  Altered sleeping 3  Tired, decreased energy 0  Change in appetite 0  Feeling bad or failure about yourself  2  Trouble concentrating 0  Moving slowly or fidgety/restless 0  Suicidal thoughts 0  PHQ-9 Score 5  Difficult doing work/chores Somewhat difficult      Review of Systems  Musculoskeletal:  Positive for back pain and gait problem.       Pain in Hand, Fingers, Back,Buttocks, Leg, Ankle,Toes  All other systems reviewed and are negative.     Objective:   Physical Exam Gen: no distress, normal  appearing HEENT: oral mucosa pink and moist, NCAT Cardio: Reg rate Chest: normal effort, normal rate of breathing Abd: soft, non-distended Ext: no edema Psych: pleasant, but generally anxious Skin: intact Neuro: Alert and oriented x 3. Normal insight and awareness. Intact Memory. Normal language and speech. Cranial nerve exam unremarkable. Pt with limited left knee ext/flex. No adf/apf appreciated on left. Decreased LT below mid thigh.  Musculoskeletal: left hand is swollen, sl red, tender with grip and palpation. Left  elbow with only mild pain. Left foot is swollen along metatarsals. Tender at left greater troch. Left knee with minimal tenderness and swelling.  Left leg remains generally tender per baseline to touch and manipulation.       Assessment & Plan:   1. Functional deficits secondary to bilateral acute/subacute cerebellar infarcts; left forearm compartment syndrome              -Continue with home exercises.  He has therapy pending later this month at [facility] neurorehab. 2.   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                           -continue cymbalta (now 60mg )                          -cincrease  lyrica to 200mg  tid                         -continue pamelor  @ 50mg  qhs                -will rx the 5mg  oxycodone#75--goal is to wean off -uds .  3 . Mood/Behavior/Sleep:                          -cymbalta 60 mg daily                         -pamelor 50mg , trazodone 60m qhs as well                         -outpt f/u schedule  4. Recent fall: His presentation is concerning considering the swelling in left hand and foot, increased left hip pain  -check xrays left hand, foot, hip today   -Discussed decision making and better safety awareness 5. : Polysubstance abuse: This has been reviewed with [email or web address]   6: Left forearm  compartment syndrome status post fasciotomies 12/21 Dr. Fredna Dow             -wound management per plastic surgery.  7: Rhabdomyolysis/renal failure:     -renal function normalizing 8: Left gluteal myositis, ongoing pain: Continue AFO for foot drop             -outpatient NCV/EMG to assess for potential sciatic nv injury             -will hold off on scheduling until his plastic issues are settled/pain improved 9, opioid induced constipation  -scheduled Miralax BID, fiber, colace             -LBM 1/21   30 MINUTES of face to face patient care time were spent during this visit. All questions were encouraged and answered. Follow up with me or np in a month.Marland Kitchen

## 2022-05-07 NOTE — Telephone Encounter (Signed)
LVM telling patient sx scheduled for 2/21.   Added preop to follow his post op on 2/9 - Notified Merry Proud

## 2022-05-08 ENCOUNTER — Encounter: Payer: Self-pay | Admitting: Internal Medicine

## 2022-05-09 ENCOUNTER — Encounter: Payer: Medicaid Other | Admitting: Physician Assistant

## 2022-05-09 ENCOUNTER — Ambulatory Visit: Payer: Medicaid Other | Admitting: Physician Assistant

## 2022-05-09 DIAGNOSIS — S41102D Unspecified open wound of left upper arm, subsequent encounter: Secondary | ICD-10-CM

## 2022-05-09 NOTE — Progress Notes (Signed)
Patient ID: [name] [name], male    DOB: 06-May-1980, 42 y.o.   MRN: [id number]  No chief complaint on file.   No diagnosis found.   History of Present Illness: [name] [name] is a 42 y.o.  male  with a history of left upper extremity wound.  The patient originally suffered an injury to his left upper extremity causing compartment syndrome on 03/20/2022.  He was taken to the OR for compartment syndrome and fasciotomy by Dr. Fredna Dow.  We took over managing the forearm wound at that point and have taken her to the OR for myriad placement and wound VAC on 04/17/2022.  He was most recently seen in the office on 05/01/2020 for wound VAC change. Marland Kitchen  He presents for preoperative evaluation for upcoming procedure, left arm split thickness skin graft with VAC placement, scheduled for 05/21/2022 with Dr. Marla Roe.  The patient has not had problems with anesthesia.   Summary of Previous Visit: The patient was last seen in the office on 05/01/2022.  At that time he continued to complain of left lower extremity pain which she is followed as an outpatient for.  He noted some left upper extremity discomfort, his wound VAC was exchanged at that time with no signs of infection.  Since his last office visit he notes he did suffer a fall and hurt his left hand.  He has been seen by Dr. Tessa Lerner and had x-rays but has not heard back from him as they were just done yesterday.  He notes some decreased range of motion in his finger secondary to swelling.  He denies any infectious signs or symptoms.  Job: Unemployed  PMH Significant for: Left forearm compartment syndrome   Past Medical History: Allergies: Allergies  Allergen Reactions   Penicillins Rash and Swelling    Rash    Current Medications:  Current Outpatient Medications:    acetaminophen (TYLENOL) 325 MG tablet, Take 1-2 tablets (325-650 mg total) by mouth every 4 (four) hours as needed for mild pain., Disp: , Rfl:    amLODipine  (NORVASC) 5 MG tablet, Take 1 tablet (5 mg total) by mouth daily., Disp: 30 tablet, Rfl: 0   aspirin EC 81 MG tablet, Take 1 tablet (81 mg total) by mouth daily. Swallow whole., Disp: 30 tablet, Rfl: 0   cyclobenzaprine (FLEXERIL) 10 MG tablet, Take 1 tablet (10 mg total) by mouth 3 (three) times daily as needed for muscle spasms., Disp: 90 tablet, Rfl: 0   docusate sodium (COLACE) 100 MG capsule, Take 1 capsule (100 mg total) by mouth 2 (two) times daily., Disp: 20 capsule, Rfl: 1   DULoxetine (CYMBALTA) 60 MG capsule, Take 1 capsule (60 mg total) by mouth daily., Disp: 30 capsule, Rfl: 0   hydrALAZINE (APRESOLINE) 25 MG tablet, Take 1 tablet (25 mg total) by mouth every 8 (eight) hours., Disp: 90 tablet, Rfl: 0   lidocaine (LIDODERM) 5 %, Place 1 patch onto the skin at bedtime. Remove & Discard patch within 12 hours or as directed by MD, Disp: 30 patch, Rfl: 0   methocarbamol (ROBAXIN) 500 MG tablet, Take 1 tablet (500 mg total) by mouth every 6 (six) hours as needed for muscle spasms., Disp: 90 tablet, Rfl: 0   Multiple Vitamin (MULTIVITAMIN WITH MINERALS) TABS tablet, Take 1 tablet by mouth daily., Disp: 30 tablet, Rfl: 0   nortriptyline (PAMELOR) 50 MG capsule, Take 1 capsule (50 mg total) by mouth at bedtime., Disp: 30 capsule, Rfl: 0  oxyCODONE (OXY IR/ROXICODONE) 5 MG immediate release tablet, Take 1 tablet (5 mg total) by mouth every 6 (six) hours as needed for severe pain., Disp: 75 tablet, Rfl: 0   polycarbophil (FIBERCON) 625 MG tablet, Take 1 tablet (625 mg total) by mouth daily., Disp: 30 tablet, Rfl: 0   polyethylene glycol (MIRALAX / GLYCOLAX) 17 g packet, Take 17 g by mouth 2 (two) times daily., Disp: 14 each, Rfl: 0   pregabalin (LYRICA) 200 MG capsule, Take 1 capsule (200 mg total) by mouth 3 (three) times daily., Disp: 90 capsule, Rfl: 4   traZODone (DESYREL) 50 MG tablet, Take 1 tablet (50 mg total) by mouth at bedtime., Disp: 30 tablet, Rfl: 0  Past Medical Problems: Past  Medical History:  Diagnosis Date   Chronic kidney disease    Open dislocation of right thumb 11/11/2021   Polysubstance abuse ([place]) 03/20/2022    Past Surgical History: Past Surgical History:  Procedure Laterality Date   APPLICATION OF A-CELL OF EXTREMITY Left 04/17/2022   Procedure: APPLICATION OF MYRIAD OF EXTREMITY;  Surgeon: Wallace Going, DO;  Location: [place];  Service: Plastics;  Laterality: Left;   APPLICATION OF WOUND VAC Left 04/07/2022   Procedure: APPLICATION OF WOUND VAC;  Surgeon: Wallace Going, DO;  Location: [place];  Service: Plastics;  Laterality: Left;   APPLICATION OF WOUND VAC Left 04/17/2022   Procedure: WOUND VAC CHANGE;  Surgeon: Wallace Going, DO;  Location: [place];  Service: Plastics;  Laterality: Left;   DEBRIDEMENT AND CLOSURE WOUND Left 03/27/2022   Procedure: CLOSURE of left dorsal fasciotomy wound and proximal and distal portion of the volar fasciotomy wound;  Surgeon: Leanora Cover, MD;  Location: [place];  Service: Orthopedics;  Laterality: Left;   FASCIOTOMY Left 03/20/2022   Procedure: FASCIOTOMY LEFT FOREARM, CARPAL TUNNEL RELEASE LEFT HAND;  Surgeon: Leanora Cover, MD;  Location: [place];  Service: Orthopedics;  Laterality: Left;   I & D EXTREMITY Right 11/11/2021   Procedure: IRRIGATION AND DEBRIDEMENT EXTREMITY;  Surgeon: Erle Crocker, MD;  [facility];  Service: Orthopedics;  Laterality: Right;   I & D EXTREMITY Left 03/27/2022   Procedure: LEFT FOREARM IRRIGATION AND DEBRIDEMENT of fasciotomy wounds including skin subcutaneous tissues;  Surgeon: Leanora Cover, MD;  Location: [place];  Service: Orthopedics;  Laterality: Left;  75 MIN   IR FLUORO GUIDE CV LINE RIGHT  04/01/2022   IR REMOVAL TUN CV CATH W/O FL  04/09/2022   IR US GUIDE VASC ACCESS RIGHT  04/01/2022   NO PAST SURGERIES      Social History: Social History   Socioeconomic History   Marital status: Single    Spouse name: Not on file   Number of children: Not on file    Years of education: Not on file   Highest education level: Not on file  Occupational History   Not on file  Tobacco Use   Smoking status: Every Day    Packs/day: 0.50    Types: Cigarettes   Smokeless tobacco: Never  Vaping Use   Vaping Use: Never used  Substance and Sexual Activity   Alcohol use: Yes    Comment: occasion   Drug use: Yes    Types: Cocaine, Marijuana, Amphetamines    Comment: states history of use   Sexual activity: Not Currently  Other Topics Concern   Not on file  Social History Narrative   Not on file   Social Determinants of Health   Financial  Resource Strain: Not on file  Food Insecurity: Not on file  Transportation Needs: Not on file  Physical Activity: Not on file  Stress: Not on file  Social Connections: Not on file  Intimate Partner Violence: Not on file    Family History: Family History  Problem Relation Age of Onset   Hypertension Other     Review of Systems: ROS  Physical Exam: Vital Signs There were no vitals taken for this visit.  Physical Exam  Constitutional:      General: Not in acute distress.    Appearance: Normal appearance. Not ill-appearing.  HENT:     Head: Normocephalic and atraumatic.  Eyes:     Pupils: Pupils are equal, round. Cardiovascular:     Rate and Rhythm: Normal rate. Pulmonary:     Effort: No respiratory distress or increased work of breathing.  Speaks in full sentences. Musculoskeletal: Left upper extremity wound measures 17 x 3 x 0.25 cm with good granulation tissue, clean wound edges and no surrounding redness discharge or warmth.  The left hand is swollen and tender along the fourth and fifth metacarpals, decreased range of motion at the fingers  Skin:    General: Skin is warm and dry.     Findings: No erythema or rash.  Neurological:     Mental Status: Alert and oriented to person, place, and time.  Psychiatric:        Mood and Affect: Mood normal.        Behavior: Behavior normal.     Assessment/Plan: The patient is scheduled for left arm split-thickness skin graft with wound VAC placement with Dr. Marla Roe.  Risks, benefits, and alternatives of procedure discussed, questions answered and consent obtained.    Smoking Status: Currently smoking  Caprini Score: 3; Risk Factors include: Age, length of surgery; Recommendation is early ambulation  Pictures obtained: Today  Post-op Rx sent to pharmacy: No antibiotics or pain medicine sent.  Patient is receiving pain medication as an outpatient.  Patient was provided with the  General Surgical Risk consent document and Pain Medication Agreement prior to their appointment.  They had adequate time to read through the risk consent documents and Pain Medication Agreement. We also discussed them in person together during this preop appointment. All of their questions were answered to their satisfaction.  Recommended calling if they have any further questions.  Risk consent form and Pain Medication Agreement to be scanned into patient's chart.  The patient also had a wound VAC change at today's visit.  He tolerated this well.  Will see him back in 1 week for wound VAC exchange.  Strict return precautions given.  Verbalized understanding and agreement to this plan.   Electronically signed by: Stevie Kern Ade Stmarie, PA-C 05/09/2022 9:23 AM

## 2022-05-09 NOTE — H&P (View-Only) (Signed)
Patient ID: [name] [name], male    DOB: 03-26-1981, 42 y.o.   MRN: [id number]  No chief complaint on file.   No diagnosis found.   History of Present Illness: [name] [name] is a 42 y.o.  male  with a history of left upper extremity wound.  The patient originally suffered an injury to his left upper extremity causing compartment syndrome on 03/20/2022.  He was taken to the OR for compartment syndrome and fasciotomy by Dr. Fredna Dow.  We took over managing the forearm wound at that point and have taken her to the OR for myriad placement and wound VAC on 04/17/2022.  He was most recently seen in the office on 05/01/2020 for wound VAC change. Marland Kitchen  He presents for preoperative evaluation for upcoming procedure, left arm split thickness skin graft with VAC placement, scheduled for 05/21/2022 with Dr. Marla Roe.  The patient has not had problems with anesthesia.   Summary of Previous Visit: The patient was last seen in the office on 05/01/2022.  At that time he continued to complain of left lower extremity pain which she is followed as an outpatient for.  He noted some left upper extremity discomfort, his wound VAC was exchanged at that time with no signs of infection.  Since his last office visit he notes he did suffer a fall and hurt his left hand.  He has been seen by Dr. Tessa Lerner and had x-rays but has not heard back from him as they were just done yesterday.  He notes some decreased range of motion in his finger secondary to swelling.  He denies any infectious signs or symptoms.  Job: Unemployed  PMH Significant for: Left forearm compartment syndrome   Past Medical History: Allergies: Allergies  Allergen Reactions   Penicillins Rash and Swelling    Rash    Current Medications:  Current Outpatient Medications:    acetaminophen (TYLENOL) 325 MG tablet, Take 1-2 tablets (325-650 mg total) by mouth every 4 (four) hours as needed for mild pain., Disp: , Rfl:    amLODipine  (NORVASC) 5 MG tablet, Take 1 tablet (5 mg total) by mouth daily., Disp: 30 tablet, Rfl: 0   aspirin EC 81 MG tablet, Take 1 tablet (81 mg total) by mouth daily. Swallow whole., Disp: 30 tablet, Rfl: 0   cyclobenzaprine (FLEXERIL) 10 MG tablet, Take 1 tablet (10 mg total) by mouth 3 (three) times daily as needed for muscle spasms., Disp: 90 tablet, Rfl: 0   docusate sodium (COLACE) 100 MG capsule, Take 1 capsule (100 mg total) by mouth 2 (two) times daily., Disp: 20 capsule, Rfl: 1   DULoxetine (CYMBALTA) 60 MG capsule, Take 1 capsule (60 mg total) by mouth daily., Disp: 30 capsule, Rfl: 0   hydrALAZINE (APRESOLINE) 25 MG tablet, Take 1 tablet (25 mg total) by mouth every 8 (eight) hours., Disp: 90 tablet, Rfl: 0   lidocaine (LIDODERM) 5 %, Place 1 patch onto the skin at bedtime. Remove & Discard patch within 12 hours or as directed by MD, Disp: 30 patch, Rfl: 0   methocarbamol (ROBAXIN) 500 MG tablet, Take 1 tablet (500 mg total) by mouth every 6 (six) hours as needed for muscle spasms., Disp: 90 tablet, Rfl: 0   Multiple Vitamin (MULTIVITAMIN WITH MINERALS) TABS tablet, Take 1 tablet by mouth daily., Disp: 30 tablet, Rfl: 0   nortriptyline (PAMELOR) 50 MG capsule, Take 1 capsule (50 mg total) by mouth at bedtime., Disp: 30 capsule, Rfl: 0  oxyCODONE (OXY IR/ROXICODONE) 5 MG immediate release tablet, Take 1 tablet (5 mg total) by mouth every 6 (six) hours as needed for severe pain., Disp: 75 tablet, Rfl: 0   polycarbophil (FIBERCON) 625 MG tablet, Take 1 tablet (625 mg total) by mouth daily., Disp: 30 tablet, Rfl: 0   polyethylene glycol (MIRALAX / GLYCOLAX) 17 g packet, Take 17 g by mouth 2 (two) times daily., Disp: 14 each, Rfl: 0   pregabalin (LYRICA) 200 MG capsule, Take 1 capsule (200 mg total) by mouth 3 (three) times daily., Disp: 90 capsule, Rfl: 4   traZODone (DESYREL) 50 MG tablet, Take 1 tablet (50 mg total) by mouth at bedtime., Disp: 30 tablet, Rfl: 0  Past Medical Problems: Past  Medical History:  Diagnosis Date   Chronic kidney disease    Open dislocation of right thumb 11/11/2021   Polysubstance abuse ([place]) 03/20/2022    Past Surgical History: Past Surgical History:  Procedure Laterality Date   APPLICATION OF A-CELL OF EXTREMITY Left 04/17/2022   Procedure: APPLICATION OF MYRIAD OF EXTREMITY;  Surgeon: Wallace Going, DO;  Location: [facility];  Service: Plastics;  Laterality: Left;   APPLICATION OF WOUND VAC Left 04/07/2022   Procedure: APPLICATION OF WOUND VAC;  Surgeon: Wallace Going, DO;  Location: [facility];  Service: Plastics;  Laterality: Left;   APPLICATION OF WOUND VAC Left 04/17/2022   Procedure: WOUND VAC CHANGE;  Surgeon: Wallace Going, DO;  Location: [facility];  Service: Plastics;  Laterality: Left;   DEBRIDEMENT AND CLOSURE WOUND Left 03/27/2022   Procedure: CLOSURE of left dorsal fasciotomy wound and proximal and distal portion of the volar fasciotomy wound;  Surgeon: Leanora Cover, MD;  Location: [facility];  Service: Orthopedics;  Laterality: Left;   FASCIOTOMY Left 03/20/2022   Procedure: FASCIOTOMY LEFT FOREARM, CARPAL TUNNEL RELEASE LEFT HAND;  Surgeon: Leanora Cover, MD;  Location: [facility];  Service: Orthopedics;  Laterality: Left;   I & D EXTREMITY Right 11/11/2021   Procedure: IRRIGATION AND DEBRIDEMENT EXTREMITY;  Surgeon: Erle Crocker, MD;  Location: [facility];  Service: Orthopedics;  Laterality: Right;   I & D EXTREMITY Left 03/27/2022   Procedure: LEFT FOREARM IRRIGATION AND DEBRIDEMENT of fasciotomy wounds including skin subcutaneous tissues;  Surgeon: Leanora Cover, MD;  Location: [place];  Service: Orthopedics;  Laterality: Left;  35 MIN   IR FLUORO GUIDE CV LINE RIGHT  04/01/2022   IR REMOVAL TUN CV CATH W/O FL  04/09/2022   IR US GUIDE VASC ACCESS RIGHT  04/01/2022   NO PAST SURGERIES      Social History: Social History   Socioeconomic History   Marital status: Single    Spouse name: Not on file   Number of children: Not on file    Years of education: Not on file   Highest education level: Not on file  Occupational History   Not on file  Tobacco Use   Smoking status: Every Day    Packs/day: 0.50    Types: Cigarettes   Smokeless tobacco: Never  Vaping Use   Vaping Use: Never used  Substance and Sexual Activity   Alcohol use: Yes    Comment: occasion   Drug use: Yes    Types: Cocaine, Marijuana, Amphetamines    Comment: states history of use   Sexual activity: Not Currently  Other Topics Concern   Not on file  Social History Narrative   Not on file   Social Determinants of Health   Financial  Resource Strain: Not on file  Food Insecurity: Not on file  Transportation Needs: Not on file  Physical Activity: Not on file  Stress: Not on file  Social Connections: Not on file  Intimate Partner Violence: Not on file    Family History: Family History  Problem Relation Age of Onset   Hypertension Other     Review of Systems: ROS  Physical Exam: Vital Signs There were no vitals taken for this visit.  Physical Exam  Constitutional:      General: Not in acute distress.    Appearance: Normal appearance. Not ill-appearing.  HENT:     Head: Normocephalic and atraumatic.  Eyes:     Pupils: Pupils are equal, round. Cardiovascular:     Rate and Rhythm: Normal rate. Pulmonary:     Effort: No respiratory distress or increased work of breathing.  Speaks in full sentences. Musculoskeletal: Left upper extremity wound measures 17 x 3 x 0.25 cm with good granulation tissue, clean wound edges and no surrounding redness discharge or warmth.  The left hand is swollen and tender along the fourth and fifth metacarpals, decreased range of motion at the fingers  Skin:    General: Skin is warm and dry.     Findings: No erythema or rash.  Neurological:     Mental Status: Alert and oriented to person, place, and time.  Psychiatric:        Mood and Affect: Mood normal.        Behavior: Behavior normal.     Assessment/Plan: The patient is scheduled for left arm split-thickness skin graft with wound VAC placement with Dr. Marla Roe.  Risks, benefits, and alternatives of procedure discussed, questions answered and consent obtained.    Smoking Status: Currently smoking  Caprini Score: 3; Risk Factors include: Age, length of surgery; Recommendation is early ambulation  Pictures obtained: Today  Post-op Rx sent to pharmacy: No antibiotics or pain medicine sent.  Patient is receiving pain medication as an outpatient.  Patient was provided with the  General Surgical Risk consent document and Pain Medication Agreement prior to their appointment.  They had adequate time to read through the risk consent documents and Pain Medication Agreement. We also discussed them in person together during this preop appointment. All of their questions were answered to their satisfaction.  Recommended calling if they have any further questions.  Risk consent form and Pain Medication Agreement to be scanned into patient's chart.  The patient also had a wound VAC change at today's visit.  He tolerated this well.  Will see him back in 1 week for wound VAC exchange.  Strict return precautions given.  Verbalized understanding and agreement to this plan.   Electronically signed by: Stevie Kern Chael Urenda, PA-C 05/09/2022 9:23 AM

## 2022-05-10 LAB — TOXASSURE SELECT,+ANTIDEPR,UR

## 2022-05-12 ENCOUNTER — Other Ambulatory Visit: Payer: Self-pay

## 2022-05-12 ENCOUNTER — Encounter (HOSPITAL_COMMUNITY): Payer: Self-pay | Admitting: Plastic Surgery

## 2022-05-12 ENCOUNTER — Telehealth: Payer: Self-pay | Admitting: Registered Nurse

## 2022-05-12 ENCOUNTER — Telehealth: Payer: Self-pay

## 2022-05-12 NOTE — Telephone Encounter (Signed)
Patient is asking if you can refill his methocarbomal, flexiril, cymbalta, and nortriptyline
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
## 2022-05-12 NOTE — Telephone Encounter (Signed)
Requesting medication refills on all non-narcotic medications.
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
## 2022-05-13 ENCOUNTER — Ambulatory Visit: Payer: Medicaid Other | Admitting: Internal Medicine

## 2022-05-13 ENCOUNTER — Encounter: Payer: Self-pay | Admitting: Internal Medicine

## 2022-05-13 VITALS — BP 112/70 | HR 123 | Ht 67.0 in | Wt 205.0 lb

## 2022-05-13 DIAGNOSIS — I639 Cerebral infarction, unspecified: Secondary | ICD-10-CM

## 2022-05-13 DIAGNOSIS — R000 Tachycardia, unspecified: Secondary | ICD-10-CM | POA: Diagnosis not present

## 2022-05-13 DIAGNOSIS — F5101 Primary insomnia: Secondary | ICD-10-CM

## 2022-05-13 DIAGNOSIS — I10 Essential (primary) hypertension: Secondary | ICD-10-CM

## 2022-05-13 DIAGNOSIS — I6930 Unspecified sequelae of cerebral infarction: Secondary | ICD-10-CM | POA: Diagnosis not present

## 2022-05-13 DIAGNOSIS — S7402XS Injury of sciatic nerve at hip and thigh level, left leg, sequela: Secondary | ICD-10-CM

## 2022-05-13 MED ORDER — TRAZODONE HCL 100 MG PO TABS: 100.0000 mg | ORAL_TABLET | Freq: Every day | ORAL | 0 refills | Status: DC

## 2022-05-13 MED ORDER — DULOXETINE HCL 60 MG PO CPEP: 60.0000 mg | ORAL_CAPSULE | Freq: Every day | ORAL | 1 refills | Status: DC

## 2022-05-13 MED ORDER — ASPIRIN 81 MG PO TBEC: 81.0000 mg | DELAYED_RELEASE_TABLET | Freq: Every day | ORAL | 1 refills | Status: DC

## 2022-05-13 MED ORDER — TRAZODONE HCL 100 MG PO TABS: 50.0000 mg | ORAL_TABLET | Freq: Every day | ORAL | 0 refills | Status: DC

## 2022-05-13 MED ORDER — NORTRIPTYLINE HCL 50 MG PO CAPS: 50.0000 mg | ORAL_CAPSULE | Freq: Every day | ORAL | 4 refills | Status: DC

## 2022-05-13 MED ORDER — METHOCARBAMOL 500 MG PO TABS: 500.0000 mg | ORAL_TABLET | Freq: Three times a day (TID) | ORAL | 2 refills | Status: DC

## 2022-05-13 MED ORDER — SENNA-DOCUSATE SODIUM 8.6-50 MG PO TABS: 1.0000 | ORAL_TABLET | Freq: Two times a day (BID) | ORAL | 0 refills | Status: DC

## 2022-05-13 MED ORDER — AMLODIPINE BESYLATE 5 MG PO TABS: 5.0000 mg | ORAL_TABLET | Freq: Every day | ORAL | 1 refills | Status: DC

## 2022-05-13 MED ORDER — DULOXETINE HCL 60 MG PO CPEP: 60.0000 mg | ORAL_CAPSULE | Freq: Every day | ORAL | 5 refills | Status: DC

## 2022-05-13 MED ORDER — NORTRIPTYLINE HCL 50 MG PO CAPS: 50.0000 mg | ORAL_CAPSULE | Freq: Every day | ORAL | 1 refills | Status: DC

## 2022-05-13 NOTE — Assessment & Plan Note (Signed)
Patient had rhabdomyolysis and necrosis of his gluteal muscle and left sciatic nerve injury.  Patient following with Dr. Tessa Lerner, PM&R.  He also has pain from his left forearm which required fasciotomy.  He has follow-up for skin graft more.  Dr. Eda Keys is prescribing opioid pain medication.  Plan: - Patient is tachycardic , restless, and hyperactive.  No recent substance use.  He is having difficulty sleeping at night. Will discontinue nortriptyline to see if this helps. Not sure if patient is having side effect from taking Cymbalta and nortriptyline.  - Refilled Cymbalta 60 mg - Prescribed Senokot tablet twice daily to help with constipation I expect is with opioid pain medications  - Discontinue muscle relaxers, out of acute phase.

## 2022-05-13 NOTE — Assessment & Plan Note (Signed)
-   EKG, NSR , 115/min PR 124 msec QRS 2msec QT 318 normal axis.  - Personally Reviewed - Recommended increase fluid intake.  - Stop nortriptyline

## 2022-05-13 NOTE — Patient Instructions (Addendum)
Thank you for trusting me with your care. To recap, today we discussed the following:   1. Tachycardia - EKG 12-Lead - Stop taking nortriptyline.  It is possible you are developing a side effect from taking duloxetine and nortriptyline together..  2. Pain management - sennosides-docusate sodium (SENOKOT-S) 8.6-50 MG tablet; Take 1 tablet by mouth 2 (two) times daily.  Dispense: 90 tablet; Refill: 0 - DULoxetine (CYMBALTA) 60 MG capsule; Take 1 capsule (60 mg total) by mouth daily.  Dispense: 90 capsule; Refill: 1  3. Cerebellar infarct ([facility]) - aspirin EC 81 MG tablet; Take 1 tablet (81 mg total) by mouth daily. Swallow whole.  Dispense: 90 tablet; Refill: 1  4. Primary hypertension - amLODipine (NORVASC) 5 MG tablet; Take 1 tablet (5 mg total) by mouth daily.  Dispense: 90 tablet; Refill: 1  5. Primary insomnia - traZODone (DESYREL) 100 MG tablet; Take 0.5 tablets (50 mg total) by mouth at bedtime.  Dispense: 90 tablet; Refill: 0

## 2022-05-13 NOTE — Progress Notes (Signed)
HPI:Mr.[name] [name] is a 42 y.o. male who presents for evaluation of Follow-up (Discuss medication refills)  Patient follows with Dr. Tessa Lerner with PMNR for pain management since his recent admission for rhabdomyolysis and sequela of injuries.  Dr. Eda Keys has refilled his opioid pain medication and they are working on tapering.  He is also on Cymbalta, Lyrica will, and Pamelor for pain. He needs refills on remainder of his medications today.  He is having constipation at home and currently taking MiraLAX fiber and Colace.  He is reports feeling anxious and restless.  We reviewed his x-rays from his previous visit because I could not get in touch with him by telephone.  No acute fractures on those x-rays.   Physical Exam: Vitals:   05/13/22 1045  BP: 112/70  Pulse: (!) 123  SpO2: 97%  Weight: 205 lb (93 kg)  Height: 5' 7"$  (1.702 m)     Physical Exam Constitutional:      Appearance: He is not diaphoretic.     Comments: Appears restless  Cardiovascular:     Rate and Rhythm: Regular rhythm. Tachycardia present.     Heart sounds: No murmur heard. Musculoskeletal:     Comments: No rigidity   Psychiatric:        Mood and Affect: Mood is anxious.        Speech: Speech normal.        Behavior: Behavior is hyperactive.        Thought Content: Thought content normal.        Cognition and Memory: Cognition normal.      Assessment & Plan:   [name] [name] was seen today for follow-up.  Tachycardia Assessment & Plan: - EKG, NSR , 115/min PR 124 msec QRS 26mec QT 318 normal axis.  - Personally Reviewed - Recommended increase fluid intake.  - Stop nortriptyline  Orders: -     EKG 12-Lead  Cerebellar infarct ([facility] Assessment & Plan: - aspirin EC 81 MG tablet; Take 1 tablet (81 mg total) by mouth daily. Swallow whole.  Dispense: 90 tablet; Refill: 1  Orders: -     Aspirin; Take 1 tablet (81 mg total) by mouth daily. Swallow whole.  Dispense: 90 tablet; Refill:  1  Primary hypertension Assessment & Plan: BP 112/70 today. Stop hydralazine Continue amlodipine 5 mg daily  Orders: -     amLODIPine Besylate; Take 1 tablet (5 mg total) by mouth daily.  Dispense: 90 tablet; Refill: 1  Primary insomnia -     traZODone HCl; Take 1 tablet (100 mg total) by mouth at bedtime.  Dispense: 90 tablet; Refill: 0  Injury of left sciatic nerve, sequela Assessment & Plan: Patient had rhabdomyolysis and necrosis of his gluteal muscle and left sciatic nerve injury.  Patient following with Dr. STessa Lerner PM&R.  He also has pain from his left forearm which required fasciotomy.  He has follow-up for skin graft more.  Dr. SEda Keysis prescribing opioid pain medication.  Plan: - Patient is tachycardic , restless, and hyperactive.  No recent substance use.  He is having difficulty sleeping at night. Will discontinue nortriptyline to see if this helps. Not sure if patient is having side effect from taking Cymbalta and nortriptyline.  - Refilled Cymbalta 60 mg - Prescribed Senokot tablet twice daily to help with constipation I expect is with opioid pain medications  - Discontinue muscle relaxers, out of acute phase.   Orders: -     Senna-Docusate Sodium; Take 1 tablet by mouth 2 (  two) times daily.  Dispense: 90 tablet; Refill: 0 -     DULoxetine HCl; Take 1 capsule (60 mg total) by mouth daily.  Dispense: 90 capsule; Refill: 1      Lorene Dy, MD

## 2022-05-13 NOTE — Assessment & Plan Note (Signed)
BP 112/70 today. Stop hydralazine Continue amlodipine 5 mg daily

## 2022-05-13 NOTE — Assessment & Plan Note (Signed)
-   aspirin EC 81 MG tablet; Take 1 tablet (81 mg total) by mouth daily. Swallow whole.  Dispense: 90 tablet; Refill: 1

## 2022-05-13 NOTE — Telephone Encounter (Signed)
I won't fill flexeril AND robaxin. I deleted flexeril and refilled robaxin

## 2022-05-14 ENCOUNTER — Encounter (HOSPITAL_COMMUNITY)
Admission: RE | Disposition: A | Discharge status: Home / Self Care | Payer: Self-pay | Source: Home / Self Care | Attending: Plastic Surgery

## 2022-05-14 ENCOUNTER — Other Ambulatory Visit: Payer: Self-pay

## 2022-05-14 ENCOUNTER — Ambulatory Visit (HOSPITAL_COMMUNITY)
Admission: RE | Admit: 2022-05-14 | Discharge: 2022-05-14 | Disposition: A | Discharge status: Home / Self Care | Payer: Medicaid Other | Attending: Plastic Surgery | Admitting: Plastic Surgery

## 2022-05-14 ENCOUNTER — Ambulatory Visit (HOSPITAL_COMMUNITY): Payer: Medicaid Other | Attending: Physician Assistant

## 2022-05-14 ENCOUNTER — Ambulatory Visit (HOSPITAL_BASED_OUTPATIENT_CLINIC_OR_DEPARTMENT_OTHER): Payer: Medicaid Other | Admitting: Anesthesiology

## 2022-05-14 ENCOUNTER — Ambulatory Visit (HOSPITAL_COMMUNITY): Payer: Medicaid Other | Admitting: Anesthesiology

## 2022-05-14 ENCOUNTER — Encounter (HOSPITAL_COMMUNITY): Payer: Medicaid Other | Admitting: Occupational Therapy

## 2022-05-14 ENCOUNTER — Encounter (HOSPITAL_COMMUNITY): Payer: Self-pay | Admitting: Plastic Surgery

## 2022-05-14 DIAGNOSIS — S51802A Unspecified open wound of left forearm, initial encounter: Secondary | ICD-10-CM | POA: Insufficient documentation

## 2022-05-14 DIAGNOSIS — F17210 Nicotine dependence, cigarettes, uncomplicated: Secondary | ICD-10-CM

## 2022-05-14 DIAGNOSIS — D649 Anemia, unspecified: Secondary | ICD-10-CM

## 2022-05-14 DIAGNOSIS — T796XXA Traumatic ischemia of muscle, initial encounter: Secondary | ICD-10-CM | POA: Insufficient documentation

## 2022-05-14 DIAGNOSIS — S41102A Unspecified open wound of left upper arm, initial encounter: Secondary | ICD-10-CM | POA: Diagnosis not present

## 2022-05-14 DIAGNOSIS — I10 Essential (primary) hypertension: Secondary | ICD-10-CM | POA: Diagnosis not present

## 2022-05-14 DIAGNOSIS — X58XXXA Exposure to other specified factors, initial encounter: Secondary | ICD-10-CM | POA: Insufficient documentation

## 2022-05-14 DIAGNOSIS — R29898 Other symptoms and signs involving the musculoskeletal system: Secondary | ICD-10-CM | POA: Insufficient documentation

## 2022-05-14 DIAGNOSIS — I639 Cerebral infarction, unspecified: Secondary | ICD-10-CM | POA: Insufficient documentation

## 2022-05-14 DIAGNOSIS — F1990 Other psychoactive substance use, unspecified, uncomplicated: Secondary | ICD-10-CM | POA: Diagnosis not present

## 2022-05-14 DIAGNOSIS — R262 Difficulty in walking, not elsewhere classified: Secondary | ICD-10-CM | POA: Insufficient documentation

## 2022-05-14 HISTORY — DX: Cerebral infarction, unspecified: I63.9

## 2022-05-14 HISTORY — PX: SKIN SPLIT GRAFT: SHX444

## 2022-05-14 HISTORY — PX: APPLICATION OF WOUND VAC: SHX5189

## 2022-05-14 HISTORY — DX: Essential (primary) hypertension: I10

## 2022-05-14 SURGERY — APPLICATION, GRAFT, SKIN, SPLIT-THICKNESS
Anesthesia: General | Site: Arm Lower | Laterality: Left

## 2022-05-14 MED ORDER — BUPIVACAINE LIPOSOME 1.3 % IJ SUSP
INTRAMUSCULAR | Status: DC | PRN
  Administered 2022-05-14: 20 mL

## 2022-05-14 MED ORDER — CLINDAMYCIN PHOSPHATE 900 MG/50ML IV SOLN
900.0000 mg | Freq: Once | INTRAVENOUS | Status: AC
  Administered 2022-05-14: 900 mg via INTRAVENOUS
  Filled 2022-05-14: qty 50

## 2022-05-14 MED ORDER — VISTASEAL 10 ML SINGLE DOSE KIT
PACK | CUTANEOUS | Status: DC | PRN
  Administered 2022-05-14: 10 mL via TOPICAL

## 2022-05-14 MED ORDER — ACETAMINOPHEN 500 MG PO TABS
1000.0000 mg | ORAL_TABLET | Freq: Once | ORAL | Status: AC
  Administered 2022-05-14: 1000 mg via ORAL
  Filled 2022-05-14: qty 2

## 2022-05-14 MED ORDER — CHLORHEXIDINE GLUCONATE 0.12 % MT SOLN: 15.0000 mL | Freq: Once | OROMUCOSAL | Status: AC

## 2022-05-14 MED ORDER — FENTANYL CITRATE (PF) 250 MCG/5ML IJ SOLN
INTRAMUSCULAR | Status: AC
  Filled 2022-05-14: qty 5

## 2022-05-14 MED ORDER — SODIUM CHLORIDE 0.9% FLUSH: 3.0000 mL | INTRAVENOUS | Status: DC | PRN

## 2022-05-14 MED ORDER — KETAMINE HCL 50 MG/5ML IJ SOSY
PREFILLED_SYRINGE | INTRAMUSCULAR | Status: AC
  Filled 2022-05-14: qty 5

## 2022-05-14 MED ORDER — ACETAMINOPHEN 325 MG PO TABS: 650.0000 mg | ORAL_TABLET | ORAL | Status: DC | PRN

## 2022-05-14 MED ORDER — 0.9 % SODIUM CHLORIDE (POUR BTL) OPTIME
TOPICAL | Status: DC | PRN
  Administered 2022-05-14: 1000 mL

## 2022-05-14 MED ORDER — KETAMINE HCL 10 MG/ML IJ SOLN
INTRAMUSCULAR | Status: DC | PRN
  Administered 2022-05-14: 40 mg via INTRAVENOUS

## 2022-05-14 MED ORDER — DEXAMETHASONE SODIUM PHOSPHATE 4 MG/ML IJ SOLN
INTRAMUSCULAR | Status: DC | PRN
  Administered 2022-05-14: 6 mg via INTRAVENOUS

## 2022-05-14 MED ORDER — FENTANYL CITRATE (PF) 100 MCG/2ML IJ SOLN: 25.0000 ug | INTRAMUSCULAR | Status: DC | PRN

## 2022-05-14 MED ORDER — PROPOFOL 10 MG/ML IV BOLUS
INTRAVENOUS | Status: AC
  Filled 2022-05-14: qty 20

## 2022-05-14 MED ORDER — FENTANYL CITRATE (PF) 100 MCG/2ML IJ SOLN
INTRAMUSCULAR | Status: DC | PRN
  Administered 2022-05-14: 100 ug via INTRAVENOUS

## 2022-05-14 MED ORDER — ACETAMINOPHEN 650 MG RE SUPP: 650.0000 mg | RECTAL | Status: DC | PRN

## 2022-05-14 MED ORDER — OXYCODONE HCL 5 MG/5ML PO SOLN: 5.0000 mg | Freq: Once | ORAL | Status: DC | PRN

## 2022-05-14 MED ORDER — CHLORHEXIDINE GLUCONATE 0.12 % MT SOLN
OROMUCOSAL | Status: AC
  Administered 2022-05-14: 15 mL via OROMUCOSAL
  Filled 2022-05-14: qty 15

## 2022-05-14 MED ORDER — BUPIVACAINE LIPOSOME 1.3 % IJ SUSP
INTRAMUSCULAR | Status: AC
  Filled 2022-05-14: qty 20

## 2022-05-14 MED ORDER — EPINEPHRINE PF 1 MG/ML IJ SOLN
INTRAMUSCULAR | Status: AC
  Filled 2022-05-14: qty 1

## 2022-05-14 MED ORDER — OXYCODONE HCL 5 MG PO TABS: 5.0000 mg | ORAL_TABLET | ORAL | Status: DC | PRN

## 2022-05-14 MED ORDER — LACTATED RINGERS IV SOLN: INTRAVENOUS | Status: DC

## 2022-05-14 MED ORDER — SODIUM CHLORIDE 0.9 % IV SOLN: 250.0000 mL | INTRAVENOUS | Status: DC | PRN

## 2022-05-14 MED ORDER — BUPIVACAINE HCL (PF) 0.25 % IJ SOLN
INTRAMUSCULAR | Status: AC
  Filled 2022-05-14: qty 30

## 2022-05-14 MED ORDER — OXYCODONE HCL 5 MG PO TABS: 5.0000 mg | ORAL_TABLET | Freq: Once | ORAL | Status: DC | PRN

## 2022-05-14 MED ORDER — PROPOFOL 10 MG/ML IV BOLUS
INTRAVENOUS | Status: DC | PRN
  Administered 2022-05-14: 250 mg via INTRAVENOUS
  Administered 2022-05-14: 50 mg via INTRAVENOUS

## 2022-05-14 MED ORDER — SODIUM CHLORIDE 0.9% FLUSH: 3.0000 mL | Freq: Two times a day (BID) | INTRAVENOUS | Status: DC

## 2022-05-14 MED ORDER — BUPIVACAINE HCL (PF) 0.25 % IJ SOLN: INTRAMUSCULAR | Status: DC | PRN

## 2022-05-14 MED ORDER — DEXMEDETOMIDINE HCL IN NACL 80 MCG/20ML IV SOLN
INTRAVENOUS | Status: DC | PRN
  Administered 2022-05-14 (×2): 8 ug via BUCCAL

## 2022-05-14 MED ORDER — ORAL CARE MOUTH RINSE: 15.0000 mL | Freq: Once | OROMUCOSAL | Status: AC

## 2022-05-14 MED ORDER — PHENYLEPHRINE 80 MCG/ML (10ML) SYRINGE FOR IV PUSH (FOR BLOOD PRESSURE SUPPORT)
PREFILLED_SYRINGE | INTRAVENOUS | Status: DC | PRN
  Administered 2022-05-14: 120 ug via INTRAVENOUS
  Administered 2022-05-14: 80 ug via INTRAVENOUS
  Administered 2022-05-14 (×2): 160 ug via INTRAVENOUS

## 2022-05-14 MED ORDER — PROMETHAZINE HCL 25 MG/ML IJ SOLN: 6.2500 mg | INTRAMUSCULAR | Status: DC | PRN

## 2022-05-14 MED ORDER — ONDANSETRON HCL 4 MG/2ML IJ SOLN
INTRAMUSCULAR | Status: DC | PRN
  Administered 2022-05-14: 4 mg via INTRAVENOUS

## 2022-05-14 MED ORDER — LIDOCAINE 2% (20 MG/ML) 5 ML SYRINGE
INTRAMUSCULAR | Status: DC | PRN
  Administered 2022-05-14: 60 mg via INTRAVENOUS

## 2022-05-14 SURGICAL SUPPLY — 57 items
BALL CTTN LRG ABS STRL LF (GAUZE/BANDAGES/DRESSINGS)
BLADE CLIPPER SURG (BLADE) IMPLANT
BLADE DERMATOME SS (BLADE) ×1 IMPLANT
BNDG ELASTIC 4X5.8 VLCR STR LF (GAUZE/BANDAGES/DRESSINGS) IMPLANT
BNDG ELASTIC 6X5.8 VLCR STR LF (GAUZE/BANDAGES/DRESSINGS) IMPLANT
BNDG GAUZE DERMACEA FLUFF 4 (GAUZE/BANDAGES/DRESSINGS) IMPLANT
BNDG GZE DERMACEA 4 6PLY (GAUZE/BANDAGES/DRESSINGS) ×2
CANISTER SUCT 3000ML PPV (MISCELLANEOUS) IMPLANT
CANISTER WOUND CARE 500ML ATS (WOUND CARE) IMPLANT
CNTNR URN SCR LID CUP LEK RST (MISCELLANEOUS) IMPLANT
CONT SPEC 4OZ STRL OR WHT (MISCELLANEOUS) ×1
COTTONBALL LRG STERILE PKG (GAUZE/BANDAGES/DRESSINGS) IMPLANT
COVER SURGICAL LIGHT HANDLE (MISCELLANEOUS) ×1 IMPLANT
DERMACARRIERS GRAFT 1 TO 1.5 (DISPOSABLE) ×1
DRAPE DERMATAC (DRAPES) IMPLANT
DRAPE HALF SHEET 40X57 (DRAPES) ×1 IMPLANT
DRAPE INCISE IOBAN 66X45 STRL (DRAPES) ×1 IMPLANT
DRAPE ORTHO SPLIT 77X108 STRL (DRAPES) ×2
DRAPE SURG ORHT 6 SPLT 77X108 (DRAPES) ×2 IMPLANT
DRESSING HYDROCOLLOID 4X4 (GAUZE/BANDAGES/DRESSINGS) IMPLANT
DRSG ADAPTIC 3X8 NADH LF (GAUZE/BANDAGES/DRESSINGS) IMPLANT
DRSG CUTIMED SORBACT 7X9 (GAUZE/BANDAGES/DRESSINGS) IMPLANT
DRSG OPSITE 6X11 MED (GAUZE/BANDAGES/DRESSINGS) IMPLANT
DRSG TELFA 3X8 NADH STRL (GAUZE/BANDAGES/DRESSINGS) ×2 IMPLANT
DRSG VAC ATS LRG SENSATRAC (GAUZE/BANDAGES/DRESSINGS) IMPLANT
DRSG VAC ATS MED SENSATRAC (GAUZE/BANDAGES/DRESSINGS) IMPLANT
DRSG VAC ATS SM SENSATRAC (GAUZE/BANDAGES/DRESSINGS) IMPLANT
ELECT REM PT RETURN 9FT ADLT (ELECTROSURGICAL)
ELECTRODE REM PT RTRN 9FT ADLT (ELECTROSURGICAL) IMPLANT
FILTER STRAW FLUID ASPIR (MISCELLANEOUS) ×1 IMPLANT
GAUZE PAD ABD 8X10 STRL (GAUZE/BANDAGES/DRESSINGS) ×1 IMPLANT
GAUZE SPONGE 4X4 12PLY STRL (GAUZE/BANDAGES/DRESSINGS) ×1 IMPLANT
GAUZE XEROFORM 5X9 LF (GAUZE/BANDAGES/DRESSINGS) ×1 IMPLANT
GEL ULTRASOUND 20GR AQUASONIC (MISCELLANEOUS) IMPLANT
GLOVE BIO SURGEON STRL SZ 6.5 (GLOVE) ×2 IMPLANT
GOWN STRL REUS W/ TWL LRG LVL3 (GOWN DISPOSABLE) ×2 IMPLANT
GOWN STRL REUS W/TWL LRG LVL3 (GOWN DISPOSABLE) ×2
GRAFT DERMACARRIERS 1 TO 1.5 (DISPOSABLE) ×1 IMPLANT
HANDPIECE INTERPULSE COAX TIP (DISPOSABLE)
KIT BASIN OR (CUSTOM PROCEDURE TRAY) ×1 IMPLANT
KIT TURNOVER KIT B (KITS) ×1 IMPLANT
NDL HYPO 25GX1X1/2 BEV (NEEDLE) ×1 IMPLANT
NEEDLE HYPO 25GX1X1/2 BEV (NEEDLE) ×2 IMPLANT
NS IRRIG 1000ML POUR BTL (IV SOLUTION) ×1 IMPLANT
PACK GENERAL/GYN (CUSTOM PROCEDURE TRAY) ×1 IMPLANT
PAD ARMBOARD 7.5X6 YLW CONV (MISCELLANEOUS) ×2 IMPLANT
SET HNDPC FAN SPRY TIP SCT (DISPOSABLE) IMPLANT
SPLINT FIBERGLASS 3X12 (CAST SUPPLIES) IMPLANT
STAPLER VISISTAT 35W (STAPLE) ×1 IMPLANT
SURGILUBE 2OZ TUBE FLIPTOP (MISCELLANEOUS) IMPLANT
SUT CHROMIC 4 0 PS 2 18 (SUTURE) IMPLANT
SUT SILK 4 0 PS 2 (SUTURE) IMPLANT
SUT VIC AB 5-0 PS2 18 (SUTURE) IMPLANT
SYR CONTROL 10ML LL (SYRINGE) ×1 IMPLANT
TOWEL GREEN STERILE (TOWEL DISPOSABLE) ×1 IMPLANT
TOWEL GREEN STERILE FF (TOWEL DISPOSABLE) ×1 IMPLANT
UNDERPAD 30X36 HEAVY ABSORB (UNDERPADS AND DIAPERS) ×1 IMPLANT

## 2022-05-14 NOTE — Transfer of Care (Signed)
Immediate Anesthesia Transfer of Care Note  Patient: [name] [name]  Procedure(s) Performed: SKIN GRAFT SPLIT THICKNESS (Left: Arm Lower) APPLICATION OF WOUND VAC (Left: Arm Lower)  Patient Location: PACU  Anesthesia Type:General  Level of Consciousness: drowsy and responds to stimulation  Airway & Oxygen Therapy: Patient Spontanous Breathing and Patient connected to face mask oxygen  Post-op Assessment: Report given to RN, Post -op Vital signs reviewed and stable, and Patient moving all extremities X 4  Post vital signs: Reviewed and stable  Last Vitals:  Vitals Value Taken Time  BP    Temp    Pulse 91 05/14/22 1511  Resp 7 05/14/22 1512  SpO2 99 % 05/14/22 1511  Vitals shown include unvalidated device data.  Last Pain:  Vitals:   05/14/22 1307  TempSrc:   PainSc: 8       Patients Stated Pain Goal: 4 ([id number])  Complications: No notable events documented.

## 2022-05-14 NOTE — Interval H&P Note (Signed)
History and Physical Interval Note:  05/14/2022 1:50 PM  [name] [name]  has presented today for surgery, with the diagnosis of left arm wound.  The various methods of treatment have been discussed with the patient and family. After consideration of risks, benefits and other options for treatment, the patient has consented to  Procedure(s): left arm split thickness skin graft with vac placement (Left) SKIN GRAFT SPLIT THICKNESS (Left) APPLICATION OF WOUND VAC (Left) as a surgical intervention.  The patient's history has been reviewed, patient examined, no change in status, stable for surgery.  I have reviewed the patient's chart and labs.  Questions were answered to the patient's satisfaction.     Loel Lofty [name] [name]

## 2022-05-14 NOTE — Telephone Encounter (Signed)
Returned call and spoke with patient's sister per his request. Notified that flexeril will not be refilled and Robaxin was sent to [facility]. Sister verbalized understanding nothing further needed.

## 2022-05-14 NOTE — Op Note (Signed)
DATE OF OPERATION: 05/14/2022  LOCATION: [facility]  PREOPERATIVE DIAGNOSIS: Left arm wound 2.5 x 17 cm  POSTOPERATIVE DIAGNOSIS: Same  PROCEDURE: Split thickness skin graft to left arm wound 2.5 x 17 cm with VAC placement  SURGEON: Briar Witherspoon [place] Shalena Ezzell, DO  ASSISTANT: Roetta Sessions, PA  EBL: 5 cc  CONDITION: Stable  COMPLICATIONS: None  INDICATION: The patient, [name] [name], is a 42 y.o. male born on 1980-09-03, is here for treatment of a wound of his left arm after being treated for compartment syndrome.   PROCEDURE DETAILS:  The patient was seen prior to surgery and marked.  The IV antibiotics were given. The patient was taken to the operating room and given a general anesthetic. A standard time out was performed and all information was confirmed by those in the room. SCD was placed on the right leg.  The left leg and left arm were prepped and draped.  The left arm was cleaned with saline and irrigated.  The left leg was marked and a 02/999 inch split thickness skin graft was obtained with the dermatome.  It was 2.5 x 17 cm in size.  The leg was then injected with the Experel.  Xeroform was applied with an ABD.    The graft was placed on the arm and secured with the 5-0 Vicryl.  The Evicel was used to help with graft adherence.  The adaptic was placed and then covered with the VAC sponge.  There was an excellent seal.  The arm was placed in a splint.     The patient was allowed to wake up and taken to recovery room in stable condition at the end of the case. The family was notified at the end of the case.   The advanced practice practitioner (APP) assisted throughout the case.  The APP was essential in retraction and counter traction when needed to make the case progress smoothly.  This retraction and assistance made it possible to see the tissue plans for the procedure.  The assistance was needed for blood control, tissue re-approximation and assisted  with closure of the incision site.

## 2022-05-14 NOTE — Therapy (Signed)
OUTPATIENT PHYSICAL THERAPY NEURO EVALUATION   Patient Name: [name] [name] MRN: [id number] DOB:[birth year], 42 y.o., male Today's Date: 05/14/2022   PCP: Johnette Abraham, MD REFERRING PROVIDER: Barbie Banner, PA-C  END OF SESSION:  PT End of Session - 05/14/22 1145     Visit Number 1    Number of Visits 24    Date for PT Re-Evaluation 08/06/22    Authorization Type [place] Medicaid Health (requested 24 visits)    Progress Note Due on Visit 12    PT Start Time 1115    PT Stop Time 1140    PT Time Calculation (min) 25 min    Activity Tolerance Other (comment)   has to end session early today. Will need to have surgery on his forearm           Past Medical History:  Diagnosis Date   Chronic kidney disease    Hypertension    Open dislocation of right thumb 11/11/2021   Polysubstance abuse ([place]) 03/20/2022   Stroke ([place])    left side   Past Surgical History:  Procedure Laterality Date   APPLICATION OF A-CELL OF EXTREMITY Left 04/17/2022   Procedure: APPLICATION OF MYRIAD OF EXTREMITY;  Surgeon: Wallace Going, DO;  Location: [facility];  Service: Plastics;  Laterality: Left;   APPLICATION OF WOUND VAC Left 04/07/2022   Procedure: APPLICATION OF WOUND VAC;  Surgeon: Wallace Going, DO;  Location: [facility];  Service: Plastics;  Laterality: Left;   APPLICATION OF WOUND VAC Left 04/17/2022   Procedure: WOUND VAC CHANGE;  Surgeon: Wallace Going, DO;  Location: [facility];  Service: Plastics;  Laterality: Left;   DEBRIDEMENT AND CLOSURE WOUND Left 03/27/2022   Procedure: CLOSURE of left dorsal fasciotomy wound and proximal and distal portion of the volar fasciotomy wound;  Surgeon: Leanora Cover, MD;  Location: [facility];  Service: Orthopedics;  Laterality: Left;   FASCIOTOMY Left 03/20/2022   Procedure: FASCIOTOMY LEFT FOREARM, CARPAL TUNNEL RELEASE LEFT HAND;  Surgeon: Leanora Cover, MD;  Location: [facility];  Service: Orthopedics;  Laterality: Left;   I & D EXTREMITY  Right 11/11/2021   Procedure: IRRIGATION AND DEBRIDEMENT EXTREMITY;  Surgeon: Erle Crocker, MD;  Location: [facility];  Service: Orthopedics;  Laterality: Right;   I & D EXTREMITY Left 03/27/2022   Procedure: LEFT FOREARM IRRIGATION AND DEBRIDEMENT of fasciotomy wounds including skin subcutaneous tissues;  Surgeon: Leanora Cover, MD;  Location: [facility];  Service: Orthopedics;  Laterality: Left;  73 MIN   IR FLUORO GUIDE CV LINE RIGHT  04/01/2022   IR REMOVAL TUN CV CATH W/O FL  04/09/2022   IR US GUIDE VASC ACCESS RIGHT  04/01/2022   Patient Active Problem List   Diagnosis Date Noted   Tachycardia 05/13/2022   Primary hypertension 05/13/2022   Primary insomnia 05/13/2022   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   Encounter for general adult medical examination with abnormal findings 05/05/2022   Acute ischemic stroke ([place]) 04/08/2022   Arm wound, left, initial encounter 04/07/2022   Cerebellar infarct ([place]) 03/21/2022   Traumatic compartment syndrome of left upper extremity ([place]) 03/21/2022   Polysubstance abuse ([place]) 03/20/2022   Septic arthritis of interphalangeal joint of finger of right hand ([place]) 11/11/2021   Open dislocation of right thumb 11/11/2021    ONSET DATE: Cannot be recalled  REFERRING DIAG: I63.9 (ICD-10-CM) - Cerebellar infarct ([place]) T79.6XXA (ICD-10-CM) - Traumatic rhabdomyolysis, initial encounter ([place])  THERAPY DIAG:  Cerebellar infarct ([place])  Difficulty  walking  Weakness of left lower extremity  Rationale for Evaluation and Treatment: Rehabilitation  SUBJECTIVE:                                                                                                                                                                                             SUBJECTIVE STATEMENT: Patient arrives in the the clinic with c/o L LE weakness. Patient states that his L ankle/foot is the part that's severely affected. Patient also reports that the L foot has some tingling and  burning pain (dorsal area) as well as some numbness. Straightening the L knee provides also  burning sensation on the back of the knee. Patient fell last week. However, imaging showed that patient did not sustain any fx. Patient fell again yesterday as he was trying to stand and landed on his back. In addition, patient hit his L foot this morning as he was trying to turn with his w/c. Patient cannot recall the onset of stroke. Patient can only remember that he woke up a month after the onset. Patient had PT for 2-3 weeks in the hospital and was able to walk with a walker. Patient was D/C last month. Patient has been trying to exercise himself at home. Patient was then referred to outpatient PT evaluation and management.  Pt accompanied by:  sister  PERTINENT HISTORY: I & D on L forearm  PAIN:  Are you having pain? Yes: NPRS scale: 8/10 Pain location: L LE Pain description: burning. constant Aggravating factors: straightening the leg Relieving factors: rest and laying in bed  PRECAUTIONS: Other: fall, wound on the L forearm (arrived with a wound vacuum0  WEIGHT BEARING RESTRICTIONS: No  FALLS: Has patient fallen in last 6 months? Yes. Number of falls cannot recall the exact total but has fallen 2x for the past week  LIVING ENVIRONMENT: Lives with: lives with their family Lives in: House/apartment Stairs: Yes: External: 3 steps; none Has following equipment at home: Environmental consultant - 2 wheeled, Wheelchair (manual), and Grab bars  PLOF: Independent  PATIENT GOALS: "to be able to walk again"  OBJECTIVE: (Most objective findings were not assessed today as patient has to leave early for his appointment as he will have surgery. Will continue to obtain objective findings on the next visit)   DIAGNOSTIC FINDINGS:  CT Head without contrast 03/23/2022 IMPRESSION: Bilateral cerebellar edema is non progressed. No new abnormality or hemorrhage.  COGNITION: Overall cognitive status: Within functional  limits for tasks assessed   SENSATION: Not assessed today.  COORDINATION: Not assessed today.   MUSCLE TONE: Not assessed today.  MUSCLE LENGTH: Not assessed today.  POSTURE: rounded shoulders and forward head  LOWER EXTREMITY ROM:  (Not assessed today)   Active  Right Eval Left Eval  Hip flexion    Hip extension    Hip abduction    Hip adduction    Hip internal rotation    Hip external rotation    Knee flexion    Knee extension    Ankle dorsiflexion    Ankle plantarflexion    Ankle inversion    Ankle eversion     (Blank rows = not tested)  LOWER EXTREMITY MMT:  (Not assessed today)  MMT Right Eval Left Eval  Hip flexion    Hip extension    Hip abduction    Hip adduction    Hip internal rotation    Hip external rotation    Knee flexion    Knee extension    Ankle dorsiflexion    Ankle plantarflexion    Ankle inversion    Ankle eversion    (Blank rows = not tested)  TRANSFERS: Not assessed today  GAIT: not assessed today   FUNCTIONAL TESTS:  Not assessed today.  PATIENT SURVEYS:  Not assessed today.  TODAY'S TREATMENT:                                                                                                                              DATE:  05/14/22 Subjective evaluation done   PATIENT EDUCATION: (not done today) Education details:  Person educated:  Education method:  Education comprehension:  HOME EXERCISE PROGRAM: None provided to date  GOALS: Goals reviewed with patient? Yes  SHORT TERM GOALS: Target date: 06/25/22  Pt will demonstrate indep in HEP to facilitate carry-over of skilled services and improve functional outcomes Goal status: INITIAL  LONG TERM GOALS: Target date: 08/06/2022  Patient will demonstrate increase in Tinetti Score by 6 points in order to demonstrate clinically significant improvement in balance and decreased risk for falls  Baseline:  Goal status: INITIAL  ASSESSMENT:  CLINICAL  IMPRESSION: Patient is a 42 y.o. male who was seen today for physical therapy evaluation for cerebellar infarction. Patient was diagnosed with cerebellar infarction by referring provider further defined by difficulty with walking due to weakness and impaired sensation. Skilled PT is required to address the impairments and functional limitations listed below. Patient did not finish his appointment today and has to leave early in order for him to have surgery on his L forearm (hospital called patient's sister advising them to bring the patient asap). As a result, most objective findings were not taken today. Will continue with further tests and measures on the next session.  OBJECTIVE IMPAIRMENTS: Abnormal gait, decreased balance, decreased coordination, decreased mobility, difficulty walking, decreased strength, decreased safety awareness, impaired flexibility, impaired sensation, and pain.   ACTIVITY LIMITATIONS: carrying, lifting, bending, standing, squatting, stairs, and transfers  PARTICIPATION LIMITATIONS: meal prep, cleaning, laundry, driving, and community activity  PERSONAL FACTORS: Fitness and 1-2 comorbidities: UE weakness and incoordination  are also affecting patient's functional outcome.   REHAB POTENTIAL: Good  CLINICAL DECISION MAKING: Evolving/moderate complexity  EVALUATION COMPLEXITY: Moderate  PLAN:  PT FREQUENCY: 2x/week  PT DURATION: 12 weeks  PLANNED INTERVENTIONS: Therapeutic exercises, Therapeutic activity, Neuromuscular re-education, Balance training, Gait training, Patient/Family education, Self Care, Stair training, Orthotic/Fit training, Electrical stimulation, and Manual therapy  PLAN FOR NEXT SESSION: Continue with objective findings. Begin LE strengthening and balance training   Chrissie Noa L. Vollie Brunty, PT, DPT, OCS Board-Certified Clinical Specialist in [place] # ([place]): [phone] T 05/14/2022, 12:51 PM

## 2022-05-14 NOTE — Anesthesia Procedure Notes (Signed)
Procedure Name: LMA Insertion Date/Time: 05/14/2022 2:06 PM  Performed by: Eulas Post, Yuktha Kerchner W, CRNAPre-anesthesia Checklist: Patient identified, Emergency Drugs available, Suction available and Patient being monitored Patient Re-evaluated:Patient Re-evaluated prior to induction Oxygen Delivery Method: Circle system utilized Preoxygenation: Pre-oxygenation with 100% oxygen Induction Type: IV induction Ventilation: Mask ventilation without difficulty LMA: LMA inserted LMA Size: 4.0 Number of attempts: 1 Placement Confirmation: positive ETCO2 and breath sounds checked- equal and bilateral Tube secured with: Tape Dental Injury: Teeth and Oropharynx as per pre-operative assessment

## 2022-05-14 NOTE — Discharge Instructions (Signed)
INSTRUCTIONS FOR AFTER SURGERY  You will likely have some questions about what to expect following your operation.  The following information will help you and your family understand what to expect when you get home.  Following these guidelines will help ensure a smooth recovery and reduce risks of complications.  Postoperative instructions include information on: diet, wound care, medications and physical activity.  AFTER SURGERY Expect to go home after the procedure.    DIET This surgery does not require a specific diet.  However, the healthier you eat the better your body can heal. It is important to increasing your protein intake.  Limit foods with high sugar and  carbohydrate content.  Focus on vegetables, meat and other protein sources if you are vegan or vegetarian.  If you undergo liposuction during your procedure it is very important to drink 8 oz of water every hour while awake for 2 days.  If your urine is bright yellow, then it is concentrated, and you need to drink more water.  If you find you are persistently nauseated or unable to take in liquids let us know.  NO TOBACCO USE or EXPOSURE.  This will slow your healing process and increase the risk of a wound.  WOUND CARE Leave the left arm dressing in place until your follow up appointment.   Do not get the left arm wet.  Leave the yellow dressing on the leg in place.  Only change the outer dressing as needed. No baths, pools or hot tubs for six weeks.  ACTIVITY No heavy lifting until cleared by the doctor.  It is OK to walk and you are encouraged to move your legs to help decrease your risk of getting a blood clot.  It will also help keep you from getting deconditioned.  Every 1 to 2 hours get up and walk for 5 minutes as you are able given you leg pain. This will help with a quicker recovery back to normal.  Let pain be your guide so you don't do too much.     SLEEPING / RESTING Sleeping and resting with your arm elevated will help  reduce swelling of your arm.     DRIVING Arrange for someone to bring you home from the hospital.  You may be able to drive a few days after surgery but not while taking any narcotics or valium.  This is for your safety as well as others sharing the road with you.  BOWEL MOVEMENTS Constipation can occur after anesthesia and while taking pain medication.  It is important to stay ahead for your comfort.  We recommend taking Milk of Magnesia (2 tablespoons; twice a day) while taking the pain pills.  MEDICATIONS (you may receive and should be started after surgery) At your preoperative visit for you history and physical you were given the following medications: Antibiotic: Start this medication when you get home and take according to the instructions on the bottle. Zofran 4 mg:  This is to treat nausea and vomiting.  You can take this every 6 hours as needed and only if needed. Norco (hydrocodone/acetaminophen) 5/325 mg:  This is only to be used after you have taken the Motrin or the Tylenol. Every 8 hours as needed.  Over the counter Medication to take: Ibuprofen (Motrin) 600 mg:  Take this every 6 hours.  If you have additional pain then take 500 mg of the Tylenol.  Only take the Norco after you have tried these two. MiraLAX or stool softener of  choice: Take this according to the bottle if you take the [place] Call your surgeon's office if any of the following occur:  Fever 101 degrees F or greater  Excessive bleeding or fluid from the incision site.  Pain that increases over time without aid from the medications  Redness, warmth, or pus draining from incision sites  Persistent nausea or inability to take in liquids  Severe misshapen area that underwent the operation.

## 2022-05-15 ENCOUNTER — Encounter (HOSPITAL_COMMUNITY): Payer: Medicaid Other | Admitting: Occupational Therapy

## 2022-05-15 ENCOUNTER — Encounter (HOSPITAL_COMMUNITY): Payer: Self-pay | Admitting: Plastic Surgery

## 2022-05-15 ENCOUNTER — Ambulatory Visit (HOSPITAL_COMMUNITY): Payer: Medicaid Other | Admitting: Speech Pathology

## 2022-05-15 ENCOUNTER — Ambulatory Visit (HOSPITAL_COMMUNITY): Payer: Medicaid Other

## 2022-05-15 NOTE — Anesthesia Postprocedure Evaluation (Signed)
Anesthesia Post Note  Patient: [name] [name]  Procedure(s) Performed: SKIN GRAFT SPLIT THICKNESS (Left: Arm Lower) APPLICATION OF WOUND VAC (Left: Arm Lower)     Patient location during evaluation: PACU Anesthesia Type: General Level of consciousness: awake and alert Pain management: pain level controlled Vital Signs Assessment: post-procedure vital signs reviewed and stable Respiratory status: spontaneous breathing, nonlabored ventilation and respiratory function stable Cardiovascular status: stable and blood pressure returned to baseline Anesthetic complications: no   No notable events documented.  Last Vitals:  Vitals:   05/14/22 1630 05/14/22 1644  BP: 118/80 137/66  Pulse: 95 95  Resp: 14 20  Temp:    SpO2: 97% 96%    Last Pain:  Vitals:   05/14/22 1630  TempSrc:   PainSc: [place]

## 2022-05-16 ENCOUNTER — Encounter (HOSPITAL_COMMUNITY): Payer: Self-pay | Admitting: Plastic Surgery

## 2022-05-16 ENCOUNTER — Ambulatory Visit: Payer: Medicaid Other | Admitting: Physician Assistant

## 2022-05-16 NOTE — OR Nursing (Signed)
Late entry: Charging for Medium Vac Sponge

## 2022-05-19 ENCOUNTER — Telehealth: Payer: Self-pay | Admitting: Physician Assistant

## 2022-05-19 ENCOUNTER — Encounter (HOSPITAL_COMMUNITY): Payer: Self-pay | Admitting: Physical Medicine & Rehabilitation

## 2022-05-19 ENCOUNTER — Telehealth: Payer: Self-pay

## 2022-05-19 ENCOUNTER — Encounter: Payer: Medicaid Other | Admitting: Physician Assistant

## 2022-05-19 ENCOUNTER — Telehealth: Payer: Self-pay | Admitting: Internal Medicine

## 2022-05-19 NOTE — Telephone Encounter (Signed)
Patient coming in today  

## 2022-05-19 NOTE — Telephone Encounter (Signed)
Patient is asking if you can write him a letter stating that he is unable to work so that he can get approved for food stamps.

## 2022-05-19 NOTE — Progress Notes (Unsigned)
Letter written for patient. It should be in My Chart.

## 2022-05-19 NOTE — Telephone Encounter (Signed)
Letter wrote and faxed.

## 2022-05-19 NOTE — Telephone Encounter (Signed)
Per provider pt to come 1:30 pm. Scheduled. No further action required at this time.

## 2022-05-19 NOTE — Telephone Encounter (Signed)
Pt called to report dressing near testicles came loose Saturday morning with some blook. Pt reports using medical tape to fix it. Also pt states having pain on leg and knee with tingling pain left leg. Pt says history of stroke and the nerves are "going crazy" where he had skin graft. Scheduled with Merry Proud at 2:45 today. He is trying to get transportation. Please call pt at this different number [phone].

## 2022-05-19 NOTE — Telephone Encounter (Signed)
Changed time of appt today to 1:00 pm due to transportation issues. Please let pt know if provider will be in earlier than that as per his surgery schedule.

## 2022-05-19 NOTE — Telephone Encounter (Signed)
Patient  called in requesting note stating that he cannot work / disabled. Patient is trying to get EBT benefits and this is a requirement.  letter can be faxed to [facility] [phone].  Patient wants a call back in regard.

## 2022-05-20 ENCOUNTER — Ambulatory Visit (INDEPENDENT_AMBULATORY_CARE_PROVIDER_SITE_OTHER): Payer: Medicaid Other | Admitting: Physician Assistant

## 2022-05-20 ENCOUNTER — Encounter: Payer: Self-pay | Admitting: Physician Assistant

## 2022-05-20 VITALS — BP 131/76 | HR 104

## 2022-05-20 DIAGNOSIS — S41102D Unspecified open wound of left upper arm, subsequent encounter: Secondary | ICD-10-CM

## 2022-05-20 NOTE — Progress Notes (Signed)
This is a 42 year old male with a history of left upper extremity wound status post compartment syndrome and fasciotomy by Dr. Fredna Dow on 03/20/2022.  He was taken to the OR by our service on 04/17/2022 for myriad placement wound VAC.  He is most recently status post left arm split-thickness skin graft with VAC placement on 05/14/2022 by Dr. Marla Roe.  Postoperatively he notes he has been doing well, he notes ongoing pain in his left leg.  He denies any fever or infectious signs or symptoms.  On exam the patient is resting comfortably in wheelchair, dressings have been removed, his donor site is clean with good granulation tissue no surrounding redness discharge or warmth, the left forearm wound is clean with skin graft in place it appears to be taking, no surrounding redness discharge or warmth  Left forearm 17 x 3.5 x 0.25, donor site 15 x 5 x 0.10    Overall [name] appears to be doing well, his skin graft is taking, he has no signs of infection.  I would recommend Xeroform dressing on the donor site as well as the left upper extremity wound followed by Mepilex border dressing.  The patient will need to follow-up in our clinic 1 week for repeat evaluation or sooner as needed.  He verbalized understanding and agreement to today's plan had no further questions or concerns.

## 2022-05-21 ENCOUNTER — Telehealth: Payer: Self-pay

## 2022-05-21 NOTE — Telephone Encounter (Signed)
Faxed wound supply order to [facility] with demographics, insurance card and most recent ov note attached. Received fax success confirmation. Will forward to front desk after correspondence is received from [facility].
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
## 2022-05-22 NOTE — Telephone Encounter (Signed)
Received correspondence from [employer] stating that would contact patient with pricing options has their insurance plan does not cover the supplies ordered under their benefits. Send order and correspondence to front desk for batch scanning.

## 2022-05-23 ENCOUNTER — Encounter: Payer: Medicaid Other | Admitting: Physician Assistant

## 2022-05-26 ENCOUNTER — Inpatient Hospital Stay: Payer: Medicaid Other | Admitting: Adult Health

## 2022-05-26 ENCOUNTER — Telehealth: Payer: Self-pay | Admitting: Adult Health

## 2022-05-26 NOTE — Telephone Encounter (Signed)
Pt cancelled appt due to went to the wrong place.

## 2022-05-27 ENCOUNTER — Encounter: Payer: Medicaid Other | Admitting: Physician Assistant

## 2022-05-28 ENCOUNTER — Encounter: Payer: Self-pay | Admitting: Internal Medicine

## 2022-05-28 ENCOUNTER — Ambulatory Visit (HOSPITAL_COMMUNITY)
Admission: RE | Admit: 2022-05-28 | Discharge: 2022-05-28 | Disposition: A | Discharge status: Home / Self Care | Payer: Medicaid Other | Source: Ambulatory Visit | Attending: Internal Medicine | Admitting: Internal Medicine

## 2022-05-28 ENCOUNTER — Ambulatory Visit: Payer: Medicaid Other | Admitting: Internal Medicine

## 2022-05-28 DIAGNOSIS — S7402XS Injury of sciatic nerve at hip and thigh level, left leg, sequela: Secondary | ICD-10-CM | POA: Diagnosis not present

## 2022-05-28 DIAGNOSIS — M79672 Pain in left foot: Secondary | ICD-10-CM | POA: Diagnosis not present

## 2022-05-28 DIAGNOSIS — M7989 Other specified soft tissue disorders: Secondary | ICD-10-CM | POA: Diagnosis not present

## 2022-05-28 MED ORDER — OXYCODONE HCL 5 MG PO TABS: 5.0000 mg | ORAL_TABLET | Freq: Four times a day (QID) | ORAL | 0 refills | Status: DC | PRN

## 2022-05-28 NOTE — Assessment & Plan Note (Addendum)
He denies any recent injury, but chart review shows -  Patient has concern for left foot pain after hitting ankle after fall. Review of recent admission shows patient has had foot drop and decreased sensation below left ankle. He has severe shooting pain after touching foot or ankle which is thought to be from sciatic nerve damage. He has follow up with neurology for EMG.  He has swelling of left foot and ankle.  Check X-ray of foot to r/o fracture, he has soft tissue swelling - would help to rule out septic etiology  Refilled Oxycodone for now, needs to f/u with PM&R

## 2022-05-28 NOTE — Assessment & Plan Note (Signed)
Has h/o low back pain and LLE pain On Lyrica

## 2022-05-28 NOTE — Progress Notes (Signed)
Acute Office Visit  Subjective:    Patient ID: [name] [name], male    DOB: February 07, 1981, 42 y.o.   MRN: [id number]  Chief Complaint  Patient presents with   Leg Swelling    Patient left leg and foot is swollen and in pain . Left hand is also swollen and in pain     HPI Patient is in today for complaint of left foot pain and swelling for the last 2 days.  Of note, he is currently on Lyrica and as needed oxycodone for neuropathy related pain.  He has history of CVA, and has had paresthesia due to it in his left LE.  He ran out of his oxycodone 2 days ago, which was placed prescribed by PM&R clinic.  He denies any dyspnea or orthopnea currently.  He also reports swelling, but denies any injury. DPA pulse is intact. He denies any injury, but he is not a reliable historian.  He also reports pain in his left UE, which is chronic. Of note, he has history of traumatic compartment syndrome of the left UE, s/p left upper extremity wound preparation and placement of myriad and wound VAC on 04/17/2022.  The patient originally suffered an injury to his left upper extremity causing compartment syndrome on 03/20/2022.  He was taken to the OR for compartment syndrome by Dr. Fredna Dow.    Past Medical History:  Diagnosis Date   Chronic kidney disease    Hypertension    Open dislocation of right thumb 11/11/2021   Polysubstance abuse ([place]) 03/20/2022   Stroke ([place])    left side    Past Surgical History:  Procedure Laterality Date   APPLICATION OF A-CELL OF EXTREMITY Left 04/17/2022   Procedure: APPLICATION OF MYRIAD OF EXTREMITY;  Surgeon: Wallace Going, DO;  Location: [place];  Service: Plastics;  Laterality: Left;   APPLICATION OF WOUND VAC Left 04/07/2022   Procedure: APPLICATION OF WOUND VAC;  Surgeon: Wallace Going, DO;  Location: [place];  Service: Plastics;  Laterality: Left;   APPLICATION OF WOUND VAC Left 04/17/2022   Procedure: WOUND VAC CHANGE;  Surgeon: Wallace Going, DO;  Location: [place];  Service: Plastics;  Laterality: Left;   APPLICATION OF WOUND VAC Left 05/14/2022   Procedure: APPLICATION OF WOUND VAC;  Surgeon: Wallace Going, DO;  Location: [place];  Service: Plastics;  Laterality: Left;   DEBRIDEMENT AND CLOSURE WOUND Left 03/27/2022   Procedure: CLOSURE of left dorsal fasciotomy wound and proximal and distal portion of the volar fasciotomy wound;  Surgeon: Leanora Cover, MD;  Location: [place];  Service: Orthopedics;  Laterality: Left;   FASCIOTOMY Left 03/20/2022   Procedure: FASCIOTOMY LEFT FOREARM, CARPAL TUNNEL RELEASE LEFT HAND;  Surgeon: Leanora Cover, MD;  Location: [place];  Service: Orthopedics;  Laterality: Left;   I & D EXTREMITY Right 11/11/2021   Procedure: IRRIGATION AND DEBRIDEMENT EXTREMITY;  Surgeon: Erle Crocker, MD;  Location: [facility];  Service: Orthopedics;  Laterality: Right;   I & D EXTREMITY Left 03/27/2022   Procedure: LEFT FOREARM IRRIGATION AND DEBRIDEMENT of fasciotomy wounds including skin subcutaneous tissues;  Surgeon: Leanora Cover, MD;  Location: [place];  Service: Orthopedics;  Laterality: Left;  46 MIN   IR FLUORO GUIDE CV LINE RIGHT  04/01/2022   IR REMOVAL TUN CV CATH W/O FL  04/09/2022   IR US GUIDE VASC ACCESS RIGHT  04/01/2022   SKIN SPLIT GRAFT Left 05/14/2022   Procedure: SKIN GRAFT SPLIT  THICKNESS;  Surgeon: Wallace Going, DO;  Location: [place];  Service: Plastics;  Laterality: Left;    Family History  Problem Relation Age of Onset   Hypertension Other     Social History   Socioeconomic History   Marital status: Single    Spouse name: Not on file   Number of children: Not on file   Years of education: Not on file   Highest education level: Not on file  Occupational History   Not on file  Tobacco Use   Smoking status: Every Day    Packs/day: 0.50    Types: Cigarettes   Smokeless tobacco: Never  Vaping Use   Vaping Use: Never used  Substance and Sexual Activity   Alcohol use: Not  Currently    Comment: occasion   Drug use: Not Currently    Types: Cocaine, Marijuana, Amphetamines    Comment: states history of use   Sexual activity: Not Currently  Other Topics Concern   Not on file  Social History Narrative   Not on file   Social Determinants of Health   Financial Resource Strain: Not on file  Food Insecurity: Not on file  Transportation Needs: Not on file  Physical Activity: Not on file  Stress: Not on file  Social Connections: Not on file  Intimate Partner Violence: Not on file    Outpatient Medications Prior to Visit  Medication Sig Dispense Refill   amLODipine (NORVASC) 5 MG tablet Take 1 tablet (5 mg total) by mouth daily. 90 tablet 1   aspirin EC 81 MG tablet Take 1 tablet (81 mg total) by mouth daily. Swallow whole. 90 tablet 1   docusate sodium (COLACE) 100 MG capsule Take 1 capsule (100 mg total) by mouth 2 (two) times daily. 20 capsule 1   DULoxetine (CYMBALTA) 60 MG capsule Take 1 capsule (60 mg total) by mouth daily. 30 capsule 5   DULoxetine (CYMBALTA) 60 MG capsule Take 1 capsule (60 mg total) by mouth daily. 90 capsule 1   methocarbamol (ROBAXIN) 500 MG tablet Take 1 tablet (500 mg total) by mouth 3 (three) times daily. 90 tablet 2   pregabalin (LYRICA) 200 MG capsule Take 1 capsule (200 mg total) by mouth 3 (three) times daily. 90 capsule 4   sennosides-docusate sodium (SENOKOT-S) 8.6-50 MG tablet Take 1 tablet by mouth 2 (two) times daily. 90 tablet 0   traZODone (DESYREL) 100 MG tablet Take 1 tablet (100 mg total) by mouth at bedtime. 90 tablet 0   oxyCODONE (OXY IR/ROXICODONE) 5 MG immediate release tablet Take 1 tablet (5 mg total) by mouth every 6 (six) hours as needed for severe pain. 75 tablet 0   No facility-administered medications prior to visit.    Allergies  Allergen Reactions   Penicillins Swelling and Rash    Per the patient never took penicillins    Review of Systems  Constitutional:  Negative for chills and fatigue.   Respiratory:  Negative for cough and shortness of breath.   Cardiovascular:  Positive for leg swelling. Negative for chest pain and palpitations.  Gastrointestinal:  Negative for nausea and vomiting.  Genitourinary:  Negative for dysuria and hematuria.  Musculoskeletal:  Positive for arthralgias, back pain and neck pain.  Skin:  Negative for rash.  Neurological:  Negative for dizziness and syncope.  Psychiatric/Behavioral:  Negative for agitation and behavioral problems. The patient is nervous/anxious.        Objective:    Physical Exam Vitals reviewed.  Constitutional:  General: He is not in acute distress.    Appearance: He is not diaphoretic.     Comments: In wheelchair  HENT:     Nose: Nose normal.     Mouth/Throat:     Mouth: Mucous membranes are moist.  Eyes:     General: No scleral icterus.    Extraocular Movements: Extraocular movements intact.  Cardiovascular:     Rate and Rhythm: Normal rate and regular rhythm.     Heart sounds: Normal heart sounds. No murmur heard. Pulmonary:     Breath sounds: Normal breath sounds. No wheezing or rales.  Musculoskeletal:     Cervical back: Neck supple. No tenderness.     Right lower leg: No edema.     Left lower leg: Edema (1+) present.  Skin:    General: Skin is warm.     Findings: No rash.  Neurological:     General: No focal deficit present.     Mental Status: He is alert and oriented to person, place, and time.  Psychiatric:        Mood and Affect: Mood normal.        Behavior: Behavior normal.     BP 139/85 (BP Location: Right Arm, Patient Position: Sitting, Cuff Size: Normal)   Pulse (!) 106   Ht '5\' 7"'$  (1.702 m)   SpO2 98%   BMI 32.11 kg/m  Wt Readings from Last 3 Encounters:  05/14/22 205 lb (93 kg)  05/13/22 205 lb (93 kg)  05/05/22 200 lb (90.7 kg)        Assessment & Plan:   Problem List Items Addressed This Visit       Nervous and Auditory   Sciatic nerve injury    Has h/o low back pain  and LLE pain On Lyrica        Other   Left foot pain    He denies any recent injury, but chart review shows -  Patient has concern for left foot pain after hitting ankle after fall. Review of recent admission shows patient has had foot drop and decreased sensation below left ankle. He has severe shooting pain after touching foot or ankle which is thought to be from sciatic nerve damage. He has follow up with neurology for EMG.  He has swelling of left foot and ankle.  Check X-ray of foot to r/o fracture, he has soft tissue swelling - would help to rule out septic etiology  Refilled Oxycodone for now, needs to f/u with PM&R      Relevant Medications   oxyCODONE (OXY IR/ROXICODONE) 5 MG immediate release tablet   Other Relevant Orders   DG Foot Complete Left     Meds ordered this encounter  Medications   oxyCODONE (OXY IR/ROXICODONE) 5 MG immediate release tablet    Sig: Take 1 tablet (5 mg total) by mouth every 6 (six) hours as needed for severe pain.    Dispense:  40 tablet    Refill:  0     Rachael Zapanta Keith Rake, MD

## 2022-05-28 NOTE — Patient Instructions (Signed)
Please take Oxycodone as needed for severe pain only. Please follow up with pain clinic for pain management.  Please get X-ray of foot done at [facility].
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
## 2022-05-30 ENCOUNTER — Ambulatory Visit (INDEPENDENT_AMBULATORY_CARE_PROVIDER_SITE_OTHER): Payer: Medicaid Other | Admitting: Physician Assistant

## 2022-05-30 DIAGNOSIS — S41102D Unspecified open wound of left upper arm, subsequent encounter: Secondary | ICD-10-CM

## 2022-05-30 NOTE — Progress Notes (Signed)
This is a 42 year old male with a history of left upper extremity wound status post compartment syndrome and fasciotomy by Dr. Fredna Dow on 03/20/2022.  He was taken to the OR by our service on 04/17/2022 for myriad placement wound VAC.  He is most recently status post left arm split-thickness skin graft with VAC placement on 05/14/2022 by Dr. Marla Roe.   Postoperatively he notes he has been doing well, he notes ongoing pain in his left leg.  He denies any fever or infectious signs or symptoms.  Since his last office visit he notes significant improvement in both the left forearm and donor site.  He notes the donor site has healed over and has no significant pain.  He notes the left forearm has healed significantly as well.  He continues to apply Vaseline and has been applying A&E ointment as well.   On exam the patient is resting comfortably in wheelchair, dressings have been removed, his donor site is clean dry and intact with no signs of infection.  The left forearm wound is clean with skin graft incorporated with the exception of 2 small areas with good granulation tissue.  No discharge surrounding redness or warmth.   Left forearm wound size 17 x 2.5 x 0    Overall the patient is doing fantastic.  I am very happy with his progress.  The skin graft has taken, he has several small areas that did not completely incorporated but have good granulation tissue.  I advised the patient to continue applying Vaseline and clean dressings.  I like to see him back in our office in 3 weeks for repeat evaluation or sooner as needed.  He verbalized understanding and agreement to today's plan had no further questions or concerns.

## 2022-06-02 ENCOUNTER — Ambulatory Visit: Payer: Medicaid Other | Admitting: Adult Health

## 2022-06-02 ENCOUNTER — Encounter: Payer: Self-pay | Admitting: Adult Health

## 2022-06-02 VITALS — BP 130/85 | HR 106 | Ht 67.0 in

## 2022-06-02 DIAGNOSIS — R55 Syncope and collapse: Secondary | ICD-10-CM | POA: Diagnosis not present

## 2022-06-02 DIAGNOSIS — F331 Major depressive disorder, recurrent, moderate: Secondary | ICD-10-CM | POA: Diagnosis not present

## 2022-06-02 DIAGNOSIS — I639 Cerebral infarction, unspecified: Secondary | ICD-10-CM

## 2022-06-02 MED ORDER — NORTRIPTYLINE HCL 75 MG PO CAPS: 75.0000 mg | ORAL_CAPSULE | Freq: Every day | ORAL | 5 refills | Status: DC

## 2022-06-02 NOTE — Patient Instructions (Addendum)
Increase nortriptyline  to '75mg'$  nightly for headaches - further management and refills will be done by Dr. Naaman Plummer physical medicine and rehab   Continue to follow with Dr. Naaman Plummer for pain management   Continue to follow with plastic surgery and orthopedics as scheduled   Follow up with physical, occupation and speech therapy as scheduled for initial consult visits next week. Try to listen to your body and not over do it.   We will complete an EEG to rule out any seizure activity, if this is normal and you continue to have episodes of lightheadedness, please ensure you follow up with your primary doctor  Referral placed to behavioral health - if you do not hear from them by next week, please let us know  Continue aspirin 81 mg daily for secondary stroke prevention  Continue to follow up with PCP regarding blood pressure management  Maintain strict control of hypertension with blood pressure goal below 130/90  Signs of a Stroke? Follow the BEFAST method:  Balance Watch for a sudden loss of balance, trouble with coordination or vertigo Eyes Is there a sudden loss of vision in one or both eyes? Or double vision?  Face: Ask the person to smile. Does one side of the face droop or is it numb?  Arms: Ask the person to raise both arms. Does one arm drift downward? Is there weakness or numbness of a leg? Speech: Ask the person to repeat a simple phrase. Does the speech sound slurred/strange? Is the person confused ? Time: If you observe any of these signs, call 911.       Thank you for coming to see [place] at [facility]. I hope we have been able to provide you high quality care today.  You may receive a patient satisfaction survey over the next few weeks. We would appreciate your feedback and comments so that we may continue to improve ourselves and the health of our patients.    Stroke Prevention Some medical conditions and behaviors can lead to a higher chance of having a  stroke. You can help prevent a stroke by eating healthy, exercising, not smoking, and managing any medical conditions you have. Stroke is a leading cause of functional impairment. Primary prevention is particularly important because a majority of strokes are first-time events. Stroke changes the lives of not only those who experience a stroke but also their family and other caregivers. How can this condition affect me? A stroke is a medical emergency and should be treated right away. A stroke can lead to brain damage and can sometimes be life-threatening. If a person gets medical treatment right away, there is a better chance of surviving and recovering from a stroke. What can increase my risk? The following medical conditions may increase your risk of a stroke: Cardiovascular disease. High blood pressure (hypertension). Diabetes. High cholesterol. Sickle cell disease. Blood clotting disorders (hypercoagulable state). Obesity. Sleep disorders (obstructive sleep apnea). Other risk factors include: Being older than age 70. Having a history of blood clots, stroke, or mini-stroke (transient ischemic attack, TIA). Genetic factors, such as race, ethnicity, or a family history of stroke. Smoking cigarettes or using other tobacco products. Taking birth control pills, especially if you also use tobacco. Heavy use of alcohol or drugs, especially cocaine and methamphetamine. Physical inactivity. What actions can I take to prevent this? Manage your health conditions High cholesterol levels. Eating a healthy diet is important for preventing high cholesterol. If cholesterol cannot be managed through diet alone,  you may need to take medicines. Take any prescribed medicines to control your cholesterol as told by your health care provider. Hypertension. To reduce your risk of stroke, try to keep your blood pressure below 130/80. Eating a healthy diet and exercising regularly are important for controlling  blood pressure. If these steps are not enough to manage your blood pressure, you may need to take medicines. Take any prescribed medicines to control hypertension as told by your health care provider. Ask your health care provider if you should monitor your blood pressure at home. Have your blood pressure checked every year, even if your blood pressure is normal. Blood pressure increases with age and some medical conditions. Diabetes. Eating a healthy diet and exercising regularly are important parts of managing your blood sugar (glucose). If your blood sugar cannot be managed through diet and exercise, you may need to take medicines. Take any prescribed medicines to control your diabetes as told by your health care provider. Get evaluated for obstructive sleep apnea. Talk to your health care provider about getting a sleep evaluation if you snore a lot or have excessive sleepiness. Make sure that any other medical conditions you have, such as atrial fibrillation or atherosclerosis, are managed. Nutrition Follow instructions from your health care provider about what to eat or drink to help manage your health condition. These instructions may include: Reducing your daily calorie intake. Limiting how much salt (sodium) you use to 1,500 milligrams (mg) each day. Using only healthy fats for cooking, such as olive oil, canola oil, or sunflower oil. Eating healthy foods. You can do this by: Choosing foods that are high in fiber, such as whole grains, and fresh fruits and vegetables. Eating at least 5 servings of fruits and vegetables a day. Try to fill one-half of your plate with fruits and vegetables at each meal. Choosing lean protein foods, such as lean cuts of meat, poultry without skin, fish, tofu, beans, and nuts. Eating low-fat dairy products. Avoiding foods that are high in sodium. This can help lower blood pressure. Avoiding foods that have saturated fat, trans fat, and cholesterol. This can  help prevent high cholesterol. Avoiding processed and prepared foods. Counting your daily carbohydrate intake.  Lifestyle If you drink alcohol: Limit how much you have to: 0-1 drink a day for women who are not pregnant. 0-2 drinks a day for men. Know how much alcohol is in your drink. In the [place] one drink equals one 12 oz bottle of beer (331m), one 5 oz glass of wine (1449m, or one 1 oz glass of hard liquor (4415m Do not use any products that contain nicotine or tobacco. These products include cigarettes, chewing tobacco, and vaping devices, such as e-cigarettes. If you need help quitting, ask your health care provider. Avoid secondhand smoke. Do not use drugs. Activity  Try to stay at a healthy weight. Get at least 30 minutes of exercise on most days, such as: Fast walking. Biking. Swimming. Medicines Take over-the-counter and prescription medicines only as told by your health care provider. Aspirin or blood thinners (antiplatelets or anticoagulants) may be recommended to reduce your risk of forming blood clots that can lead to stroke. Avoid taking birth control pills. Talk to your health care provider about the risks of taking birth control pills if: You are over 35 49ars old. You smoke. You get very bad headaches. You have had a blood clot. Where to find more information American Stroke Association: www.strokeassociation.org Get help right away if: You or a  loved one has any symptoms of a stroke. "BE FAST" is an easy way to remember the main warning signs of a stroke: B - Balance. Signs are dizziness, sudden trouble walking, or loss of balance. E - Eyes. Signs are trouble seeing or a sudden change in vision. F - Face. Signs are sudden weakness or numbness of the face, or the face or eyelid drooping on one side. A - Arms. Signs are weakness or numbness in an arm. This happens suddenly and usually on one side of the body. S - Speech. Signs are sudden trouble speaking,  slurred speech, or trouble understanding what people say. T - Time. Time to call emergency services. Write down what time symptoms started. You or a loved one has other signs of a stroke, such as: A sudden, severe headache with no known cause. Nausea or vomiting. Seizure. These symptoms may represent a serious problem that is an emergency. Do not wait to see if the symptoms will go away. Get medical help right away. Call your local emergency services (911 in the [place] Do not drive yourself to the hospital. Summary You can help to prevent a stroke by eating healthy, exercising, not smoking, limiting alcohol intake, and managing any medical conditions you may have. Do not use any products that contain nicotine or tobacco. These include cigarettes, chewing tobacco, and vaping devices, such as e-cigarettes. If you need help quitting, ask your health care provider. Remember "BE FAST" for warning signs of a stroke. Get help right away if you or a loved one has any of these signs. This information is not intended to replace advice given to you by your health care provider. Make sure you discuss any questions you have with your health care provider. Document Revised: 09/29/2019 Document Reviewed: 10/17/2019 Elsevier Patient Education  [place].

## 2022-06-03 ENCOUNTER — Encounter: Payer: Self-pay | Admitting: Internal Medicine

## 2022-06-03 ENCOUNTER — Ambulatory Visit: Payer: Medicaid Other | Admitting: Internal Medicine

## 2022-06-03 VITALS — BP 122/79 | HR 112 | Ht 67.5 in | Wt 181.8 lb

## 2022-06-03 DIAGNOSIS — R062 Wheezing: Secondary | ICD-10-CM | POA: Diagnosis not present

## 2022-06-03 DIAGNOSIS — M79672 Pain in left foot: Secondary | ICD-10-CM

## 2022-06-03 MED ORDER — ALBUTEROL SULFATE HFA 108 (90 BASE) MCG/ACT IN AERS: 2.0000 | INHALATION_SPRAY | Freq: Four times a day (QID) | RESPIRATORY_TRACT | 2 refills | Status: DC | PRN

## 2022-06-03 NOTE — Assessment & Plan Note (Signed)
He endorses intermittent wheezing, particular with exertion, and has requested an albuterol inhaler for as needed use.  He previously received albuterol nebulizer treatments during a recent hospital admission, which he states was effective.  He was also discharged with albuterol inhaler that helped for a brief period of time.  His pulmonary exam today is unremarkable. -Albuterol inhaler prescribed for as needed use

## 2022-06-03 NOTE — Patient Instructions (Signed)
It was a pleasure to see you today.  Thank you for giving [place] the opportunity to be involved in your care.  Below is a brief recap of your visit and next steps.  We will plan to see you again in 4 weeks.  Summary Albuterol refilled We will follow up in 1 month for routine care and to see how PT is going

## 2022-06-03 NOTE — Assessment & Plan Note (Signed)
Return to care today for persistent left ankle pain after a fall on his left lower extremity 1 week ago.  Imaging last week was negative for fracture but demonstrated soft tissue swelling.  He has tenderness palpation over the lateral portion of the ankle and ATFL.  Pain is exacerbated with inversion of the left ankle.  His current symptoms and exam findings today are suggestive of an ATFL sprain.  -I have recommended continued supportive care measures.  He reports today that he will start physical therapy tomorrow (3/6).

## 2022-06-03 NOTE — Progress Notes (Signed)
Acute Office Visit  Subjective:     Patient ID: [name] [name], male    DOB: 1980/06/19, 42 y.o.   MRN: [id number]  Chief Complaint  Patient presents with   Foot Injury    Follow up   inhaler   [name] [name] presents today for an acute visit for evaluation of left foot pain and to discuss receiving a prescription for albuterol inhaler.  He reports mechanical fall on his left lower extremity approximately 1 week ago.  He was evaluated at [facility] by Dr. Posey Pronto on 2/28.  X-rays obtained at that time showed soft tissue swelling but were negative for acute fracture or dislocation.  He returns to care today because his pain has persisted.  [name] [name] additionally endorses wheezing.  He states that he received periodic albuterol treatments during hospitalization in December.  He would like to receive an albuterol inhaler today for as needed use.  Review of Systems  Respiratory:  Positive for wheezing.   Musculoskeletal:  Positive for joint pain (Left foot/ankle pain).  All other systems reviewed and are negative.     Objective:    BP 122/79   Pulse (!) 112   Ht 5' 7.5" (1.715 m)   Wt 181 lb 12.8 oz (82.5 kg)   SpO2 98%   BMI 28.05 kg/m   Physical Exam Vitals reviewed.  Constitutional:      Comments: Examined in wheelchair  Pulmonary:     Effort: Pulmonary effort is normal. No respiratory distress.     Breath sounds: Normal breath sounds. No wheezing, rhonchi or rales.  Musculoskeletal:        General: Swelling and tenderness present.     Comments: No obvious deformity present on inspection of the left ankle.  Passive ROM is generally intact.  There is tenderness palpation over the left lateral malleolus and ATFL.  Pain is elicited with inversion of the left ankle.  Negative talar tilt.       Assessment & Plan:   Problem List Items Addressed This Visit       Left foot pain    Return to care today for persistent left ankle pain after a fall on his left lower  extremity 1 week ago.  Imaging last week was negative for fracture but demonstrated soft tissue swelling.  He has tenderness palpation over the lateral portion of the ankle and ATFL.  Pain is exacerbated with inversion of the left ankle.  His current symptoms and exam findings today are suggestive of an ATFL sprain.  -I have recommended continued supportive care measures.  He reports today that he will start physical therapy tomorrow (3/6).      Wheezing - Primary    He endorses intermittent wheezing, particular with exertion, and has requested an albuterol inhaler for as needed use.  He previously received albuterol nebulizer treatments during a recent hospital admission, which he states was effective.  He was also discharged with albuterol inhaler that helped for a brief period of time.  His pulmonary exam today is unremarkable. -Albuterol inhaler prescribed for as needed use       Meds ordered this encounter  Medications   albuterol (VENTOLIN HFA) 108 (90 Base) MCG/ACT inhaler    Sig: Inhale 2 puffs into the lungs every 6 (six) hours as needed for wheezing or shortness of breath.    Dispense:  8 g    Refill:  2    Return in about 4 weeks (around 07/01/2022).  Johnette Abraham,  MD   

## 2022-06-04 ENCOUNTER — Encounter (HOSPITAL_COMMUNITY): Payer: Self-pay | Admitting: Occupational Therapy

## 2022-06-04 ENCOUNTER — Encounter (HOSPITAL_COMMUNITY): Payer: Self-pay | Admitting: Speech Pathology

## 2022-06-04 ENCOUNTER — Ambulatory Visit (HOSPITAL_COMMUNITY): Payer: Medicaid Other | Admitting: Occupational Therapy

## 2022-06-04 ENCOUNTER — Ambulatory Visit (HOSPITAL_COMMUNITY): Payer: Medicaid Other | Attending: Physician Assistant

## 2022-06-04 ENCOUNTER — Ambulatory Visit (HOSPITAL_COMMUNITY): Payer: Medicaid Other | Admitting: Speech Pathology

## 2022-06-04 DIAGNOSIS — R29818 Other symptoms and signs involving the nervous system: Secondary | ICD-10-CM | POA: Diagnosis not present

## 2022-06-04 DIAGNOSIS — R278 Other lack of coordination: Secondary | ICD-10-CM | POA: Diagnosis not present

## 2022-06-04 DIAGNOSIS — R41841 Cognitive communication deficit: Secondary | ICD-10-CM | POA: Insufficient documentation

## 2022-06-04 DIAGNOSIS — I639 Cerebral infarction, unspecified: Secondary | ICD-10-CM | POA: Diagnosis not present

## 2022-06-04 DIAGNOSIS — R262 Difficulty in walking, not elsewhere classified: Secondary | ICD-10-CM | POA: Diagnosis not present
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
## 2022-06-04 NOTE — Therapy (Signed)
OUTPATIENT OCCUPATIONAL THERAPY NEURO EVALUATION  Patient Name: [name] [name] MRN: [id number] DOB:07/22/1980, 42 y.o., male Today's Date: 06/05/2022  PCP: Johnette Abraham, MD REFERRING PROVIDER: Barbie Banner, PA-C  END OF SESSION:  OT End of Session - 06/05/22 1528     Visit Number 1    Number of Visits 13    Date for OT Re-Evaluation 07/25/22    Authorization Type [place] Medicaid [place]    Progress Note Due on Visit 10    OT Start Time 0900    OT Stop Time 0945    OT Time Calculation (min) 45 min    Activity Tolerance Patient tolerated treatment well    Behavior During Therapy [facility] for tasks assessed/performed             Past Medical History:  Diagnosis Date   Chronic kidney disease    Hypertension    Open dislocation of right thumb 11/11/2021   Polysubstance abuse ([place]) 03/20/2022   Stroke ([place])    left side   Past Surgical History:  Procedure Laterality Date   APPLICATION OF A-CELL OF EXTREMITY Left 04/17/2022   Procedure: APPLICATION OF MYRIAD OF EXTREMITY;  Surgeon: Wallace Going, DO;  Location: [place];  Service: Plastics;  Laterality: Left;   APPLICATION OF WOUND VAC Left 04/07/2022   Procedure: APPLICATION OF WOUND VAC;  Surgeon: Wallace Going, DO;  Location: [place];  Service: Plastics;  Laterality: Left;   APPLICATION OF WOUND VAC Left 04/17/2022   Procedure: WOUND VAC CHANGE;  Surgeon: Wallace Going, DO;  Location: [place];  Service: Plastics;  Laterality: Left;   APPLICATION OF WOUND VAC Left 05/14/2022   Procedure: APPLICATION OF WOUND VAC;  Surgeon: Wallace Going, DO;  Location: [place];  Service: Plastics;  Laterality: Left;   DEBRIDEMENT AND CLOSURE WOUND Left 03/27/2022   Procedure: CLOSURE of left dorsal fasciotomy wound and proximal and distal portion of the volar fasciotomy wound;  Surgeon: Leanora Cover, MD;  Location: [place];  Service: Orthopedics;  Laterality: Left;   FASCIOTOMY Left 03/20/2022   Procedure:  FASCIOTOMY LEFT FOREARM, CARPAL TUNNEL RELEASE LEFT HAND;  Surgeon: Leanora Cover, MD;  Location: [place];  Service: Orthopedics;  Laterality: Left;   I & D EXTREMITY Right 11/11/2021   Procedure: IRRIGATION AND DEBRIDEMENT EXTREMITY;  Surgeon: Erle Crocker, MD;  [facility];  Service: Orthopedics;  Laterality: Right;   I & D EXTREMITY Left 03/27/2022   Procedure: LEFT FOREARM IRRIGATION AND DEBRIDEMENT of fasciotomy wounds including skin subcutaneous tissues;  Surgeon: Leanora Cover, MD;  Location: [place];  Service: Orthopedics;  Laterality: Left;  60 MIN   IR FLUORO GUIDE CV LINE RIGHT  04/01/2022   IR REMOVAL TUN CV CATH W/O FL  04/09/2022   IR US GUIDE VASC ACCESS RIGHT  04/01/2022   SKIN SPLIT GRAFT Left 05/14/2022   Procedure: SKIN GRAFT SPLIT THICKNESS;  Surgeon: Wallace Going, DO;  Location: [place];  Service: Plastics;  Laterality: Left;   Patient Active Problem List   Diagnosis Date Noted   Wheezing 06/03/2022   Tachycardia 05/13/2022   Primary hypertension 05/13/2022   Primary insomnia 05/13/2022   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   Encounter for general adult medical examination with abnormal findings 05/05/2022   Left foot pain 05/05/2022   Acute ischemic stroke ([place]) 04/08/2022   Arm wound, left, initial encounter 04/07/2022   Cerebellar infarct ([place]) 03/21/2022   Traumatic compartment syndrome of left upper  extremity ([place]) 03/21/2022   Polysubstance abuse ([place]) 03/20/2022   Septic arthritis of interphalangeal joint of finger of right hand ([place]) 11/11/2021   Open dislocation of right thumb 11/11/2021    ONSET DATE: 03/21/22  REFERRING DIAG: Cerebellar Infarct  THERAPY DIAG:  Other lack of coordination  Other symptoms and signs involving the nervous system  Rationale for Evaluation and Treatment: Rehabilitation  SUBJECTIVE:   SUBJECTIVE STATEMENT: "I'm coming to terms with what happened to me." Pt accompanied by: self  PERTINENT HISTORY: Pt is s/p  stroke. He was found on the floor at home with L sided weakness, balance deficits, and dizziness. Pt received AIR following hospitalization.   PRECAUTIONS: Fall  WEIGHT BEARING RESTRICTIONS: No  PAIN:  Are you having pain? Yes: NPRS scale: 8/10 Pain location: LLE at the hip Pain description: aching and severe Aggravating factors: weightbearing Relieving factors: rest  FALLS: Has patient fallen in last 6 months? Yes. Number of falls 10+  LIVING ENVIRONMENT: Lives with: lives with their family Lives in: House/apartment Stairs: Yes: External: 3 steps; none Has following equipment at home: Gilford Rile - 2 wheeled, Wheelchair (manual), and shower chair  PLOF: Independent  PATIENT GOALS: To get his arm functioning back  OBJECTIVE:   HAND DOMINANCE: Right  ADLs: Overall ADLs: Pt requiring assist with all ADL's. He is unable to fully dress, bath, or complete toileting without at least min assist. For Cooking and cleaning, his is unable to sustain activities due to weakness and decreased activity tolerance.   MOBILITY STATUS: Hx of falls, difficulty with turns, and difficulty carrying objections with ambulation  POSTURE COMMENTS:  No Significant postural limitations Sitting balance: Moves/returns truncal midpoint 1-2 inches in multiple planes  ACTIVITY TOLERANCE: Activity tolerance: Limited at times  FUNCTIONAL OUTCOME MEASURES: Quick Dash: 79.55  UPPER EXTREMITY ROM:   LUE A/ROM WFL, RUE limited in flexion and abduction around 65%.  UPPER EXTREMITY MMT:     MMT Left eval  Shoulder flexion   Shoulder abduction   Shoulder adduction   Shoulder extension   Shoulder internal rotation   Shoulder external rotation   Elbow flexion   Elbow extension   Wrist flexion   Wrist extension   Wrist ulnar deviation   Wrist radial deviation   Wrist pronation   Wrist supination   (Blank rows = not tested)  **Will test next session  HAND FUNCTION: Grip strength: Right: 120 lbs;  Left: 20 lbs, Lateral pinch: Right: 20 lbs, Left: 4 lbs, and 3 point pinch: Right: 16 lbs, Left: 1 lbs  COORDINATION: 9 Hole Peg test: Right: '1\' 8"'$  sec; Left: '2\' 14"'$  sec  SENSATION: Light touch: Impaired  Proprioception: Impaired   EDEMA: Mild edema in the L hand around the joints  COGNITION: Overall cognitive status:  Pt having difficulty with memory, attention/focus, and problem solving  VISION: Subjective report: R eye is blurry - having dizzy spells Baseline vision: No visual deficits Visual history:  No prior concerns  VISION ASSESSMENT: WFL  Patient has difficulty with following activities due to following visual impairments: unable to drive and at times have difficulty with mobility due to dizziness.  OBSERVATIONS: Mild tone in the L elbow and hand   TODAY'S TREATMENT:  DATE: 06/04/22: Evaluation Only   PATIENT EDUCATION: Education details: Will initiate first treatment session Person educated: Patient Education method: Explanation and Demonstration Education comprehension: verbalized understanding and returned demonstration  HOME EXERCISE PROGRAM: Will initiate first treatment session   GOALS: Goals reviewed with patient? Yes  SHORT TERM GOALS: Target date: 07/25/22  Pt will be educated on HEP to improve ability to actively use LUE during functional task completion.  Goal status: INITIAL  2.  Pt will increase strength in LUE to 4+/5 to improve ability to complete lifting tasks required during cooking and cleaning tasks.  Goal status: INITIAL  3.   Pt will increase left grip strength by 15# and pinch strength by 2-3# to improve ability to grasp and hold pots and pans during meal preparation.  Goal status: INITIAL  4.   Pt will increase fine motor coordination in LUE by completing 9 hole peg test in 55" or less to improve ability to  perform dressing tasks including operating buttons or zippers.  Goal status: INITIAL  5.    Pt will increase ROM in left digits to improve ability to form a full grasp required for holding items. Goal status: INITIAL   ASSESSMENT:  CLINICAL IMPRESSION: Patient is a 42 y.o. male who was seen today for occupational therapy evaluation for s/p cerebellar CVA's. Pt completed AIR prior to referral to outpatient OT.    PERFORMANCE DEFICITS: in functional skills including ADLs, IADLs, coordination, dexterity, sensation, edema, tone, ROM, strength, pain, fascial restrictions, Fine motor control, Gross motor control, body mechanics, and UE functional use, cognitive skills including attention, energy/drive, memory, problem solving, and safety awareness, and psychosocial skills including coping strategies and environmental adaptation.   IMPAIRMENTS: are limiting patient from ADLs, IADLs, rest and sleep, work, leisure, and social participation.   CO-MORBIDITIES: has no other co-morbidities that affects occupational performance. Patient will benefit from skilled OT to address above impairments and improve overall function.  MODIFICATION OR ASSISTANCE TO COMPLETE EVALUATION: No modification of tasks or assist necessary to complete an evaluation.  OT OCCUPATIONAL PROFILE AND HISTORY: Problem focused assessment: Including review of records relating to presenting problem.  CLINICAL DECISION MAKING: LOW - limited treatment options, no task modification necessary  REHAB POTENTIAL: Good  EVALUATION COMPLEXITY: Low    PLAN:  OT FREQUENCY: 2x/week  OT DURATION: 6 weeks  PLANNED INTERVENTIONS: self care/ADL training, therapeutic exercise, therapeutic activity, neuromuscular re-education, manual therapy, passive range of motion, functional mobility training, electrical stimulation, ultrasound, paraffin, moist heat, cryotherapy, patient/family education, coping strategies training, and DME and/or AE  instructions  RECOMMENDED OTHER SERVICES: PT  CONSULTED AND AGREED WITH PLAN OF CARE: Patient  PLAN FOR NEXT SESSION: Weight bearing, shoulder strengthening, grip strengthening, pinch strengthening  Dallie Dad [facility] [place], [place] 06/05/2022, 3:31 PM

## 2022-06-04 NOTE — Therapy (Signed)
OUTPATIENT PHYSICAL THERAPY NEURO PROGRESS NOTE/TREATMENT NOTE  Patient Name: [name] [name] MRN: [id number] DOB:Oct 17, 1980, 42 y.o., male Today's Date: 06/04/2022   PCP: Johnette Abraham, MD REFERRING PROVIDER: Barbie Banner, PA-C  END OF SESSION:  PT End of Session - 06/04/22 1026     Visit Number 2    Number of Visits 24    Date for PT Re-Evaluation 08/06/22    Authorization Type [place] Medicaid Health (requested 24 visits)    Progress Note Due on Visit 12    PT Start Time 1030    PT Stop Time 1115    PT Time Calculation (min) 45 min    Equipment Utilized During Treatment Gait belt    Activity Tolerance Patient tolerated treatment well    Behavior During Therapy [facility] for tasks assessed/performed            Past Medical History:  Diagnosis Date   Chronic kidney disease    Hypertension    Open dislocation of right thumb 11/11/2021   Polysubstance abuse ([place]) 03/20/2022   Stroke ([place])    left side   Past Surgical History:  Procedure Laterality Date   APPLICATION OF A-CELL OF EXTREMITY Left 04/17/2022   Procedure: APPLICATION OF MYRIAD OF EXTREMITY;  Surgeon: Wallace Going, DO;  Location: [place];  Service: Plastics;  Laterality: Left;   APPLICATION OF WOUND VAC Left 04/07/2022   Procedure: APPLICATION OF WOUND VAC;  Surgeon: Wallace Going, DO;  Location: [place];  Service: Plastics;  Laterality: Left;   APPLICATION OF WOUND VAC Left 04/17/2022   Procedure: WOUND VAC CHANGE;  Surgeon: Wallace Going, DO;  Location: [place];  Service: Plastics;  Laterality: Left;   APPLICATION OF WOUND VAC Left 05/14/2022   Procedure: APPLICATION OF WOUND VAC;  Surgeon: Wallace Going, DO;  Location: [place];  Service: Plastics;  Laterality: Left;   DEBRIDEMENT AND CLOSURE WOUND Left 03/27/2022   Procedure: CLOSURE of left dorsal fasciotomy wound and proximal and distal portion of the volar fasciotomy wound;  Surgeon: Leanora Cover, MD;  Location: [place];   Service: Orthopedics;  Laterality: Left;   FASCIOTOMY Left 03/20/2022   Procedure: FASCIOTOMY LEFT FOREARM, CARPAL TUNNEL RELEASE LEFT HAND;  Surgeon: Leanora Cover, MD;  Location: [place];  Service: Orthopedics;  Laterality: Left;   I & D EXTREMITY Right 11/11/2021   Procedure: IRRIGATION AND DEBRIDEMENT EXTREMITY;  Surgeon: Erle Crocker, MD;  [facility];  Service: Orthopedics;  Laterality: Right;   I & D EXTREMITY Left 03/27/2022   Procedure: LEFT FOREARM IRRIGATION AND DEBRIDEMENT of fasciotomy wounds including skin subcutaneous tissues;  Surgeon: Leanora Cover, MD;  Location: [place];  Service: Orthopedics;  Laterality: Left;  60 MIN   IR FLUORO GUIDE CV LINE RIGHT  04/01/2022   IR REMOVAL TUN CV CATH W/O FL  04/09/2022   IR US GUIDE VASC ACCESS RIGHT  04/01/2022   SKIN SPLIT GRAFT Left 05/14/2022   Procedure: SKIN GRAFT SPLIT THICKNESS;  Surgeon: Wallace Going, DO;  Location: [place];  Service: Plastics;  Laterality: Left;   Patient Active Problem List   Diagnosis Date Noted   Wheezing 06/03/2022   Tachycardia 05/13/2022   Primary hypertension 05/13/2022   Primary insomnia 05/13/2022   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   Encounter for general adult medical examination with abnormal findings 05/05/2022   Left foot pain 05/05/2022   Acute ischemic stroke ([place]) 04/08/2022   Arm wound, left, initial encounter 04/07/2022  Cerebellar infarct ([place]) 03/21/2022   Traumatic compartment syndrome of left upper extremity ([place]) 03/21/2022   Polysubstance abuse ([place]) 03/20/2022   Septic arthritis of interphalangeal joint of finger of right hand ([place]) 11/11/2021   Open dislocation of right thumb 11/11/2021    ONSET DATE: Cannot be recalled  REFERRING DIAG: I63.9 (ICD-10-CM) - Cerebellar infarct ([place]) T79.6XXA (ICD-10-CM) - Traumatic rhabdomyolysis, initial encounter ([place])  THERAPY DIAG:  Difficulty walking  Rationale for Evaluation and Treatment: Rehabilitation  SUBJECTIVE:                                                                                                                                                                                              SUBJECTIVE STATEMENT: Patient denies any "weird" sensation on the L LE but still reports that the L foot is "dead". Still c/o soreness on the L calf worse in the morning and at night. Attempted to walk and had several episodes of falls due to dizziness and headaches. Patient reports that he gets HA and blurry vision when he stands up. Per patient, MD told him that the back side of his brain is "dead". Claims that the harder he concentrates on something, the more that he will have HA. Patient is able to navigate the stairs up/down but he has to lean his arm on his sister. Going down is easier than going up the stairs. Patient reports that he sprained his L ankle recently and was already checked by MD. X-ray was done and revealed no fx. Patient is now wearing an ankle brace for foot drop.  EVAL 05/14/22: Patient arrives in the the clinic with c/o L LE weakness. Patient states that his L ankle/foot is the part that's severely affected. Patient also reports that the L foot has some tingling and burning pain (dorsal area) as well as some numbness. Straightening the L knee provides also  burning sensation on the back of the knee. Patient fell last week. However, imaging showed that patient did not sustain any fx. Patient fell again yesterday as he was trying to stand and landed on his back. In addition, patient hit his L foot this morning as he was trying to turn with his w/c. Patient cannot recall the onset of stroke. Patient can only remember that he woke up a month after the onset. Patient had PT for 2-3 weeks in the hospital and was able to walk with a walker. Patient was D/C last month. Patient has been trying to exercise himself at home. Patient was then referred to outpatient PT evaluation and management.  Pt accompanied by:   sister  PERTINENT HISTORY:  I & D on L forearm  PAIN:  Are you having pain? Yes: NPRS scale: 6/10 Pain location: L LE Pain description: burning. constant Aggravating factors: straightening the leg Relieving factors: rest and laying in bed  PRECAUTIONS: Other: fall, wound on the L forearm (arrived with a wound vacuum0  WEIGHT BEARING RESTRICTIONS: No  FALLS: Has patient fallen in last 6 months? Yes. Number of falls cannot recall the exact total but has fallen 2x for the past week  LIVING ENVIRONMENT: Lives with: lives with their family Lives in: House/apartment Stairs: Yes: External: 3 steps; none Has following equipment at home: Environmental consultant - 2 wheeled, Wheelchair (manual), and Grab bars  PLOF: Independent  PATIENT GOALS: "to be able to walk again"  OBJECTIVE: Objective findings today are part of the continuation of IE on 05/14/22:  DIAGNOSTIC FINDINGS:  CT Head without contrast 03/23/2022 IMPRESSION: Bilateral cerebellar edema is non progressed. No new abnormality or hemorrhage.  COGNITION: Overall cognitive status: Within functional limits for tasks assessed   SENSATION: Light touch: Impaired ( L LE feels "soreness" on light touch)  COORDINATION: Impaired heel-to-shin on the L   MUSCLE TONE: slightly hypotonic on the L LE  MUSCLE LENGTH: Mild tightness on B hamstrings Moderate tightness on B gastrocsoleus Moderate tightness on L piriformis, mild on the R  POSTURE: rounded shoulders and forward head  LOWER EXTREMITY ROM:  (Not assessed today)   Active  Right Eval Left Eval  Hip flexion [facility]  Hip extension    Hip abduction [facility]  Hip adduction    Hip internal rotation    Hip external rotation    Knee flexion [facility] WFL  Knee extension [facility] WFL  Ankle dorsiflexion WFL WFL (PROM)  Ankle plantarflexion WFL WFL (PROM)  Ankle inversion    Ankle eversion     (Blank rows = not tested)  LOWER EXTREMITY MMT:  (Not assessed today)  MMT Right Eval Left Eval   Hip flexion 5 4-  Hip extension    Hip abduction 5 3+  Hip adduction 5 4  Hip internal rotation    Hip external rotation    Knee flexion 5 4-  Knee extension 5 3+  Ankle dorsiflexion 5 0  Ankle plantarflexion 5 1  Ankle inversion    Ankle eversion    (Blank rows = not tested)  TRANSFERS: SBA on W/C to bed and back, no AD  GAIT: Ambulated for 69 ft with AD but with CGA, needs W/C to be behind patient (steppage gait on the L, L foot drop/slap, L LE in ER, L knee flexed in midstance, slight circumducting gait)   FUNCTIONAL TESTS:  5x STS:14.73 sec 2MWT: 80 ft (no AD) Tinetti POMA: 12 (balance + gait, no AD)  TODAY'S TREATMENT:                                                                                                                              DATE:  06/04/22 Progress Note (objective assessment) Gait training with a hemiwalker for ~ 100 ft, CGA-SBA Step-up/down on a 4" box with CGA x 5 to simulate stair negotiation Patient education on sensory re-education with the use of silk cloth on L LE x 5', tid  05/14/22 Subjective evaluation done   PATIENT EDUCATION: Education details: Educated on the pathoanatomy of CVA. Educated on the goals and course of rehab. Educated on Musician at home Person educated: Patient Education method: Explanation Education comprehension: verbalized understanding  HOME EXERCISE PROGRAM: sensory re-education with the use of silk cloth x 5', tid  GOALS: Goals reviewed with patient? Yes  SHORT TERM GOALS: Target date: 06/25/22  Pt will demonstrate indep in HEP to facilitate carry-over of skilled services and improve functional outcomes Goal status: INITIAL  LONG TERM GOALS: Target date: 08/06/2022  Patient will demonstrate increase in Tinetti Score by 6 points in order to demonstrate clinically significant improvement in balance and decreased risk for falls  Baseline:  Goal status: INITIAL  Pt will decrease 5TSTS by at least  3 seconds in order to demonstrate clinically significant improvement in LE strength Baseline: 14.73 sec Goal status: INITIAL  3. Pt will increase 2MWT by at least 40 ft in order to demonstrate clinically significant improvement in community ambulation Baseline: 80 ft Goal status: INITIAL  4. Pt will demonstrate increase in ankle DF strength to 3/5 to facilitate ease and safety in ambulation Baseline: 0/5 Goal status: INITIAL  5. Pt will demonstrate increase in overall LE strength to 4+/5 to facilitate ease and safety in ambulation Baseline: 3+/5 Goal status: INITIAL    ASSESSMENT:  CLINICAL IMPRESSION: Patient demonstrated continued difficulty with walking and stair negotiation due to weakness, impaired sensation, impaired proprioception, and incoordination. Skilled PT is required to address the impairments and functional limitations listed below. Patient was able to ambulate with a hemiwalker with little to no difficulty and needing only CGA-SBA. However, mild to moderate difficulty was noted with step-ups and downs due to weakness. Will contact referring provider to order patient a hemiwalker.  EVAL 05/14/22: Patient is a 42 y.o. male who was seen today for physical therapy evaluation for cerebellar infarction. Patient was diagnosed with cerebellar infarction by referring provider further defined by difficulty with walking due to weakness and impaired sensation. Skilled PT is required to address the impairments and functional limitations listed below. Patient did not finish his appointment today and has to leave early in order for him to have surgery on his L forearm (hospital called patient's sister advising them to bring the patient asap). As a result, most objective findings were not taken today. Will continue with further tests and measures on the next session.  OBJECTIVE IMPAIRMENTS: Abnormal gait, decreased balance, decreased coordination, decreased mobility, difficulty walking,  decreased strength, decreased safety awareness, impaired flexibility, impaired sensation, and pain.   ACTIVITY LIMITATIONS: carrying, lifting, bending, standing, squatting, stairs, and transfers  PARTICIPATION LIMITATIONS: meal prep, cleaning, laundry, driving, and community activity  PERSONAL FACTORS: Fitness and 1-2 comorbidities: UE weakness and incoordination  are also affecting patient's functional outcome.   REHAB POTENTIAL: Good  CLINICAL DECISION MAKING: Evolving/moderate complexity  EVALUATION COMPLEXITY: Moderate  PLAN:  PT FREQUENCY: 2x/week  PT DURATION: 12 weeks  PLANNED INTERVENTIONS: Therapeutic exercises, Therapeutic activity, Neuromuscular re-education, Balance training, Gait training, Patient/Family education, Self Care, Stair training, Orthotic/Fit training, Electrical stimulation, and Manual therapy  PLAN FOR NEXT SESSION: Continue POC and may progress as tolerated with emphasis on LE strengthening, balance, and gait  training   [name]. Amora Sheehy, PT, DPT, OCS Board-Certified Clinical Specialist in [facility] # ([place]): [phone] T 06/04/2022, 10:27 AM

## 2022-06-05 ENCOUNTER — Encounter: Payer: Self-pay | Admitting: Registered Nurse

## 2022-06-05 ENCOUNTER — Telehealth: Payer: Self-pay | Admitting: *Deleted

## 2022-06-05 ENCOUNTER — Encounter: Payer: Medicaid Other | Attending: Registered Nurse | Admitting: Registered Nurse

## 2022-06-05 VITALS — BP 112/76 | HR 120 | Ht 67.5 in | Wt 183.0 lb

## 2022-06-05 DIAGNOSIS — I639 Cerebral infarction, unspecified: Secondary | ICD-10-CM | POA: Diagnosis not present

## 2022-06-05 DIAGNOSIS — R000 Tachycardia, unspecified: Secondary | ICD-10-CM | POA: Diagnosis present

## 2022-06-05 DIAGNOSIS — Z79891 Long term (current) use of opiate analgesic: Secondary | ICD-10-CM | POA: Diagnosis not present

## 2022-06-05 DIAGNOSIS — Z5181 Encounter for therapeutic drug level monitoring: Secondary | ICD-10-CM | POA: Diagnosis not present

## 2022-06-05 DIAGNOSIS — S7402XS Injury of sciatic nerve at hip and thigh level, left leg, sequela: Secondary | ICD-10-CM | POA: Diagnosis not present

## 2022-06-05 DIAGNOSIS — R296 Repeated falls: Secondary | ICD-10-CM | POA: Insufficient documentation

## 2022-06-05 DIAGNOSIS — T796XXS Traumatic ischemia of muscle, sequela: Secondary | ICD-10-CM | POA: Insufficient documentation

## 2022-06-05 DIAGNOSIS — F1910 Other psychoactive substance abuse, uncomplicated: Secondary | ICD-10-CM | POA: Insufficient documentation

## 2022-06-05 DIAGNOSIS — M21372 Foot drop, left foot: Secondary | ICD-10-CM | POA: Diagnosis present

## 2022-06-05 DIAGNOSIS — G894 Chronic pain syndrome: Secondary | ICD-10-CM | POA: Insufficient documentation

## 2022-06-05 NOTE — Progress Notes (Signed)
I agree with the above plan 

## 2022-06-05 NOTE — Telephone Encounter (Signed)
Patient calling to check the status of pain medication being sent to pharmacy. He was told that ET had to check with Dr. Naaman Plummer. Advised when Dr. Naaman Plummer responds someone from the office with give him a call. Nothing further needed.

## 2022-06-05 NOTE — Progress Notes (Signed)
Subjective:    Patient ID: [name] [name], male    DOB: 14-Jul-1980, 42 y.o.   MRN: [id number]  HPI: [name] [name] is a 42 y.o. male who returns for follow up appointment  of his Rhabdomyolysis, Sciatic Nerve Injury and chronic pain and medication refill. He states his  pain is located in his left hand and left lower extremity. He rates his pain 10. current exercise regime is walking and performing stretching exercises.  He also reports he fell three times, he states he was sliding his couch and lost his balance and landed on his back and his sister helped him up.  He report he tripped over his dog and landed on his left side, his sister helped him up.  Also states he forgot to lock his wheelchair and landed on his left slide, family helped him up. He was educated on falls prevention and instructed to use his walker at all times, he verbalizes understanding. He states he seen his PCP Dr [name] [name] post falls.  [name] arrived to office tachycardic apical pulse checked, He has polysubstance abuse history he denies any drug use .   [name] [name] [name] equivalent is 30.00 MME.   Last UDS was Performed on 05/07/2022, it was inconsistent, he states he had taken one of his father's Valium. Educated on the [employer], he verbalizes understanding.   Oral Swab was Performed today, no refills until results are reviewed, he verbalizes understanding. This was discussed with Dr Naaman Plummer and he agrees with plan.    Pain Inventory Average Pain 7 Pain Right Now 10 My pain is constant, sharp, burning, stabbing, tingling, and aching  In the last 24 hours, has pain interfered with the following? General activity 7 Relation with others 0 Enjoyment of life 7 What TIME of day is your pain at its worst? morning  and night Sleep (in general) Poor  Pain is worse with: walking, bending, inactivity, standing, and some activites Pain improves with: rest and therapy/exercise Relief  from Meds: 4  Family History  Problem Relation Age of Onset   Hypertension Other    Social History   Socioeconomic History   Marital status: Single    Spouse name: Not on file   Number of children: Not on file   Years of education: Not on file   Highest education level: Not on file  Occupational History   Not on file  Tobacco Use   Smoking status: Every Day    Packs/day: 0.50    Types: Cigarettes   Smokeless tobacco: Never  Vaping Use   Vaping Use: Never used  Substance and Sexual Activity   Alcohol use: Not Currently    Comment: occasion   Drug use: Not Currently    Types: Cocaine, Marijuana, Amphetamines    Comment: states history of use   Sexual activity: Not Currently  Other Topics Concern   Not on file  Social History Narrative   Not on file   Social Determinants of Health   Financial Resource Strain: Not on file  Food Insecurity: Not on file  Transportation Needs: Not on file  Physical Activity: Not on file  Stress: Not on file  Social Connections: Not on file   Past Surgical History:  Procedure Laterality Date   APPLICATION OF A-CELL OF EXTREMITY Left 04/17/2022   Procedure: APPLICATION OF MYRIAD OF EXTREMITY;  Surgeon: Wallace Going, DO;  Location: [place];  Service: Plastics;  Laterality: Left;   APPLICATION OF WOUND VAC  Left 04/07/2022   Procedure: APPLICATION OF WOUND VAC;  Surgeon: Wallace Going, DO;  Location: [place];  Service: Plastics;  Laterality: Left;   APPLICATION OF WOUND VAC Left 04/17/2022   Procedure: WOUND VAC CHANGE;  Surgeon: Wallace Going, DO;  Location: [place];  Service: Plastics;  Laterality: Left;   APPLICATION OF WOUND VAC Left 05/14/2022   Procedure: APPLICATION OF WOUND VAC;  Surgeon: Wallace Going, DO;  Location: [place];  Service: Plastics;  Laterality: Left;   DEBRIDEMENT AND CLOSURE WOUND Left 03/27/2022   Procedure: CLOSURE of left dorsal fasciotomy wound and proximal and distal portion of the volar  fasciotomy wound;  Surgeon: Leanora Cover, MD;  Location: [place];  Service: Orthopedics;  Laterality: Left;   FASCIOTOMY Left 03/20/2022   Procedure: FASCIOTOMY LEFT FOREARM, CARPAL TUNNEL RELEASE LEFT HAND;  Surgeon: Leanora Cover, MD;  Location: [place];  Service: Orthopedics;  Laterality: Left;   I & D EXTREMITY Right 11/11/2021   Procedure: IRRIGATION AND DEBRIDEMENT EXTREMITY;  Surgeon: Erle Crocker, MD;  [facility];  Service: Orthopedics;  Laterality: Right;   I & D EXTREMITY Left 03/27/2022   Procedure: LEFT FOREARM IRRIGATION AND DEBRIDEMENT of fasciotomy wounds including skin subcutaneous tissues;  Surgeon: Leanora Cover, MD;  Location: [place];  Service: Orthopedics;  Laterality: Left;  60 MIN   IR FLUORO GUIDE CV LINE RIGHT  04/01/2022   IR REMOVAL TUN CV CATH W/O FL  04/09/2022   IR US GUIDE VASC ACCESS RIGHT  04/01/2022   SKIN SPLIT GRAFT Left 05/14/2022   Procedure: SKIN GRAFT SPLIT THICKNESS;  Surgeon: Wallace Going, DO;  Location: [place];  Service: Plastics;  Laterality: Left;   Past Surgical History:  Procedure Laterality Date   APPLICATION OF A-CELL OF EXTREMITY Left 04/17/2022   Procedure: APPLICATION OF MYRIAD OF EXTREMITY;  Surgeon: Wallace Going, DO;  Location: [place];  Service: Plastics;  Laterality: Left;   APPLICATION OF WOUND VAC Left 04/07/2022   Procedure: APPLICATION OF WOUND VAC;  Surgeon: Wallace Going, DO;  Location: [place];  Service: Plastics;  Laterality: Left;   APPLICATION OF WOUND VAC Left 04/17/2022   Procedure: WOUND VAC CHANGE;  Surgeon: Wallace Going, DO;  Location: [place];  Service: Plastics;  Laterality: Left;   APPLICATION OF WOUND VAC Left 05/14/2022   Procedure: APPLICATION OF WOUND VAC;  Surgeon: Wallace Going, DO;  Location: [place];  Service: Plastics;  Laterality: Left;   DEBRIDEMENT AND CLOSURE WOUND Left 03/27/2022   Procedure: CLOSURE of left dorsal fasciotomy wound and proximal and distal portion of the  volar fasciotomy wound;  Surgeon: Leanora Cover, MD;  Location: [employer];  Service: Orthopedics;  Laterality: Left;   FASCIOTOMY Left 03/20/2022   Procedure: FASCIOTOMY LEFT FOREARM, CARPAL TUNNEL RELEASE LEFT HAND;  Surgeon: Leanora Cover, MD;  Location: [place];  Service: Orthopedics;  Laterality: Left;   I & D EXTREMITY Right 11/11/2021   Procedure: IRRIGATION AND DEBRIDEMENT EXTREMITY;  Surgeon: Erle Crocker, MD;  [facility];  Service: Orthopedics;  Laterality: Right;   I & D EXTREMITY Left 03/27/2022   Procedure: LEFT FOREARM IRRIGATION AND DEBRIDEMENT of fasciotomy wounds including skin subcutaneous tissues;  Surgeon: Leanora Cover, MD;  Location: [place];  Service: Orthopedics;  Laterality: Left;  18 MIN   IR FLUORO GUIDE CV LINE RIGHT  04/01/2022   IR REMOVAL TUN CV CATH W/O FL  04/09/2022   IR US GUIDE VASC  ACCESS RIGHT  04/01/2022   SKIN SPLIT GRAFT Left 05/14/2022   Procedure: SKIN GRAFT SPLIT THICKNESS;  Surgeon: Wallace Going, DO;  Location: [place];  Service: Plastics;  Laterality: Left;   Past Medical History:  Diagnosis Date   Chronic kidney disease    Hypertension    Open dislocation of right thumb 11/11/2021   Polysubstance abuse ([place]) 03/20/2022   Stroke ([place])    left side   BP 112/76   Pulse (!) 135   Ht 5' 7.5" (1.715 m)   Wt 183 lb (83 kg) Comment: reported  SpO2 98%   BMI 28.24 kg/m   Opioid Risk Score:   Fall Risk Score:  `1  Depression screen [facility] 2/9     06/05/2022   11:00 AM 06/03/2022   11:01 AM 05/28/2022    2:48 PM 05/13/2022   10:46 AM 05/07/2022   11:21 AM 05/05/2022   10:07 AM  Depression screen [facility] 2/9  Decreased Interest 1 0 0 0 0 0  Down, Depressed, Hopeless 1 0 0 0 1 0  PHQ - 2 Score 2 0 0 0 1 0  Altered sleeping  '1 3 3 3 3  '$ Tired, decreased energy  1 0 0 0 0  Change in appetite  0 0 0 0 0  Feeling bad or failure about yourself   0 0 0 1 2  Trouble concentrating  1 0 0 0 0  Moving slowly or fidgety/restless  0 0 0 0 0  Suicidal  thoughts  0 0 0 0 0  PHQ-9 Score  '3 3 3 5 5  '$ Difficult doing work/chores   Very difficult Very difficult  Somewhat difficult     Review of Systems  HENT: Negative.    Eyes: Negative.   Respiratory: Negative.    Cardiovascular: Negative.   Gastrointestinal: Negative.   Endocrine: Negative.   Genitourinary: Negative.   Musculoskeletal:  Positive for back pain and gait problem.       Left arm and leg  Skin: Negative.   Allergic/Immunologic: Negative.   Hematological: Negative.   Psychiatric/Behavioral:  Positive for dysphoric mood.   All other systems reviewed and are negative.      Objective:   Physical Exam Vitals and nursing note reviewed.  Constitutional:      Appearance: Normal appearance.  Neck:     Comments: Cervical Paraspinal Tenderness: C-5- C-6 Mainly Left Side Cardiovascular:     Rate and Rhythm: Regular rhythm. Tachycardia present.  Pulmonary:     Effort: Pulmonary effort is normal.     Breath sounds: Normal breath sounds.  Musculoskeletal:     Cervical back: Normal range of motion and neck supple.     Comments: Normal Muscle Bulk and Muscle Testing Reveals:  Upper Extremities: Right: Decreased ROM 45 Degrees and Muscle Strength 5/5 Left Upper Extremity: Full ROM and Muscle Strength 2/5 Lower Extremities : Right: Full ROM and Muscle Strength 5/5 Left Lower Extremity: Decreased ROM and Muscle Strength 4/5 Wearing AFO Arrived in wheelchair     Skin:    General: Skin is warm and dry.  Neurological:     Mental Status: He is alert and oriented to person, place, and time.  Psychiatric:        Mood and Affect: Mood normal.        Behavior: Behavior normal.         Assessment & Plan:  Acute Ischemic Stroke: Continue with Outpatient Therapy. Was seen by Neurology on 06/02/2022,  note was reviewed.  Continue to Monitor.  2. Traumatic Rhabdomyolysis: Continue to Monitor.  3. Left Sciatic Nerve Injury: Continue current medication regimen with Pregablin.  Continue to monitor.. 4. Tachycardia: Apical Pulse Checked: He will F/U with his PCP  5. Polysubstance Abuse: He denies Drug Use. Continue to Monitor.  5. Chronic Pain Syndrome: Continue Oxycodone: Oral Swab ordered today. Awaiting Results. We will continue the opioid monitoring program, this consists of regular clinic visits, examinations, urine drug screen, pill counts as well as use of [place] Controlled Substance Reporting system. A 12 month History has been reviewed on the [place] Controlled Substance Reporting System on 06/05/2022. Continue to Monitor.  6. Frequent Falls: Educated on Falls Prevention he verbalizes understanding. He was instructed to use walker at all times, he verbalizes understanding.  7. Left Foot Drop: Wearing AFO. Continue to Monitor.   F/U with Dr Naaman Plummer

## 2022-06-09 ENCOUNTER — Encounter: Payer: Self-pay | Admitting: *Deleted

## 2022-06-09 ENCOUNTER — Other Ambulatory Visit: Payer: Medicaid Other | Admitting: *Deleted

## 2022-06-09 ENCOUNTER — Telehealth: Payer: Self-pay | Admitting: *Deleted

## 2022-06-09 NOTE — Telephone Encounter (Signed)
Awaiting on results from Oral Swab,

## 2022-06-09 NOTE — Telephone Encounter (Signed)
Patient calling to check the status of his pain meds being filled Friday 06/06/22 @ 4:22.

## 2022-06-09 NOTE — Telephone Encounter (Signed)
LM still awaiting results of Oral swab.

## 2022-06-10 ENCOUNTER — Encounter (HOSPITAL_COMMUNITY): Payer: Medicaid Other | Admitting: Speech Pathology

## 2022-06-11 LAB — DRUG TOX MONITOR 1 W/CONF, ORAL FLD
Amphetamine: NEGATIVE ng/mL (ref <10)
Amphetamines: POSITIVE ng/mL — AB (ref <10)
Barbiturates: NEGATIVE ng/mL (ref <10)
Benzodiazepines: NEGATIVE ng/mL (ref <0.50)
Buprenorphine: NEGATIVE ng/mL (ref <0.10)
Buprenorphine: NEGATIVE ng/mL (ref <0.10)
Cocaine: NEGATIVE ng/mL (ref <5.0)
Codeine: NEGATIVE ng/mL (ref <2.5)
Cotinine: 47.3 ng/mL — ABNORMAL HIGH (ref <5.0)
Dihydrocodeine: NEGATIVE ng/mL (ref <2.5)
Fentanyl: NEGATIVE ng/mL (ref <0.10)
Heroin Metabolite: NEGATIVE ng/mL (ref <1.0)
Hydrocodone: NEGATIVE ng/mL (ref <2.5)
Hydromorphone: NEGATIVE ng/mL (ref <2.5)
MARIJUANA: POSITIVE ng/mL — AB (ref <2.5)
MDMA: NEGATIVE ng/mL (ref <10)
Meprobamate: NEGATIVE ng/mL (ref <2.5)
Methadone: NEGATIVE ng/mL (ref <5.0)
Methamphetamine: 15 ng/mL — ABNORMAL HIGH (ref <10)
Morphine: NEGATIVE ng/mL (ref <2.5)
Naloxone: NEGATIVE ng/mL (ref <0.25)
Nicotine Metabolite: POSITIVE ng/mL — AB (ref <5.0)
Norbuprenorphine: NEGATIVE ng/mL (ref <0.50)
Norhydrocodone: NEGATIVE ng/mL (ref <2.5)
Noroxycodone: 3.7 ng/mL — ABNORMAL HIGH (ref <2.5)
Opiates: POSITIVE ng/mL — AB (ref <2.5)
Oxycodone: 4.5 ng/mL — ABNORMAL HIGH (ref <2.5)
Oxymorphone: NEGATIVE ng/mL (ref <2.5)
Phencyclidine: NEGATIVE ng/mL (ref <10)
THC: 184.3 ng/mL — ABNORMAL HIGH (ref <2.5)
Tapentadol: NEGATIVE ng/mL (ref <5.0)
Tramadol: NEGATIVE ng/mL (ref <5.0)
Zolpidem: NEGATIVE ng/mL (ref <5.0)

## 2022-06-11 LAB — DRUG TOX ALC METAB W/CON, ORAL FLD: Alcohol Metabolite: NEGATIVE ng/mL (ref <25)

## 2022-06-12 ENCOUNTER — Telehealth: Payer: Self-pay | Admitting: *Deleted

## 2022-06-12 NOTE — Telephone Encounter (Signed)
Oral swab is positive for methamphetamine and marijuana, as well as prescribed oxycodone. He has called multiple times for refill on his oxycodone. Please advise.

## 2022-06-13 ENCOUNTER — Telehealth (HOSPITAL_COMMUNITY): Payer: Self-pay | Admitting: Occupational Therapy

## 2022-06-13 ENCOUNTER — Encounter (HOSPITAL_COMMUNITY): Payer: Medicaid Other | Admitting: Occupational Therapy

## 2022-06-13 NOTE — Telephone Encounter (Signed)
Due to results of UDS Mr Asman will be discharged from clinic with no further Rx. I reached out by telephone and did not get an answer. Letter mailed certified [employer]PS informing him of discharge from clinic. No MyCHart available.

## 2022-06-13 NOTE — Telephone Encounter (Signed)
Called pt to inquire about no show for 3/15 OT appointment, pt reports that he is in extreme pain with his foot and did not remember that he had an appointment. Attempted to remind pt of his next appointment and discuss attendance policy, although pt hung the phone up.   Arvil Persons, OTR/L

## 2022-06-17 ENCOUNTER — Ambulatory Visit (HOSPITAL_COMMUNITY): Payer: Medicaid Other | Admitting: Speech Pathology

## 2022-06-19 ENCOUNTER — Encounter (HOSPITAL_COMMUNITY): Payer: Medicaid Other | Admitting: Speech Pathology

## 2022-06-20 ENCOUNTER — Ambulatory Visit (HOSPITAL_COMMUNITY): Payer: Medicaid Other

## 2022-06-20 ENCOUNTER — Telehealth (HOSPITAL_COMMUNITY): Payer: Self-pay

## 2022-06-20 ENCOUNTER — Encounter: Payer: Medicaid Other | Admitting: Physician Assistant

## 2022-06-20 NOTE — Telephone Encounter (Signed)
Called patient today to follow up with his appointment as he did not show up with his appointment today. However, patient did not pick up so a voice message was left reminding of his future appointments and the cancellation/no show policy of the facility.   [name]. Tamra Koos, PT, DPT, OCS Board-Certified Clinical Specialist in [place] # ([place]): [id number]

## 2022-06-24 ENCOUNTER — Encounter (HOSPITAL_COMMUNITY): Payer: Medicaid Other | Admitting: Speech Pathology

## 2022-06-26 ENCOUNTER — Encounter (HOSPITAL_COMMUNITY): Payer: Medicaid Other | Admitting: Speech Pathology

## 2022-06-27 ENCOUNTER — Encounter (HOSPITAL_COMMUNITY): Payer: Self-pay | Admitting: Occupational Therapy

## 2022-06-27 ENCOUNTER — Ambulatory Visit (HOSPITAL_COMMUNITY): Payer: Medicaid Other

## 2022-06-27 ENCOUNTER — Telehealth (HOSPITAL_COMMUNITY): Payer: Self-pay | Admitting: Occupational Therapy

## 2022-06-27 ENCOUNTER — Encounter (HOSPITAL_COMMUNITY): Payer: Self-pay

## 2022-06-27 ENCOUNTER — Telehealth (HOSPITAL_COMMUNITY): Payer: Self-pay

## 2022-06-27 ENCOUNTER — Encounter (HOSPITAL_COMMUNITY): Payer: Medicaid Other | Admitting: Occupational Therapy

## 2022-06-27 NOTE — Therapy (Signed)
PHYSICAL THERAPY DISCHARGE SUMMARY  Visits from Start of Care: 2  Current functional level related to goals / functional outcomes: Not assessed due to noncompliance   Remaining deficits: Not assessed due to noncompliance   Education / Equipment: N/A   Patient agrees to discharge. Patient goals were  not assessed due to non compliance . Patient is being discharged due to not returning since the last visit.  [name]. Lilianna Case, PT, DPT, OCS Board-Certified Clinical Specialist in [place] # ([place]): [id number] T

## 2022-06-27 NOTE — Telephone Encounter (Signed)
[name] [name] called the patient today to follow up with his appointment today. Per [name], patient did not pick up the call so she left a voice message regarding D/C from rehab due to no shows.  [name]. [name], PT, DPT, OCS Board-Certified Clinical Specialist in [place] # ([place]): [id number] T

## 2022-06-27 NOTE — Telephone Encounter (Signed)
Called pt to discuss discharge due to attendance.No answer, LVM regarding patient d/c from Ot/PT/ST- if pat would like to continue services at this clinic he will need a new referral from provider.   Arvil Persons, OTR/L

## 2022-06-27 NOTE — Therapy (Unsigned)
[facility] [place], [place], [address] Phone: [phone]   Fax:  [phone]  Patient Details  Name: [name] [name] MRN: [id number] Date of Birth: 07/14/1980 Referring Provider:  No ref. provider found  Encounter Date: 06/27/2022   OCCUPATIONAL THERAPY DISCHARGE SUMMARY  Visits from Start of Care: 1  Current functional level related to goals / functional outcomes: unknown   Remaining deficits: unknown   Education / Equipment: If pt desires outpatient therapy, a new referral will be needed due to attendance. Pt was called multiple times reminded of attendance policy and reminded/confirmed future appointments.    Patient agrees to discharge. Patient goals were not met. Patient is being discharged due to  attendance, pt has not shown up for therapy appointments.Frederic Jericho, OTR/L 06/27/2022, 2:12 PM  [facility] at [place], [place], [address] Phone: [phone]   Fax:  [phone]

## 2022-07-01 ENCOUNTER — Encounter: Payer: Self-pay | Admitting: Internal Medicine

## 2022-07-01 ENCOUNTER — Ambulatory Visit: Payer: Medicaid Other | Admitting: Internal Medicine

## 2022-07-01 ENCOUNTER — Encounter (HOSPITAL_COMMUNITY): Payer: Medicaid Other | Admitting: Occupational Therapy

## 2022-07-01 ENCOUNTER — Ambulatory Visit (HOSPITAL_COMMUNITY): Payer: Medicaid Other

## 2022-07-04 ENCOUNTER — Encounter (HOSPITAL_COMMUNITY): Payer: Medicaid Other | Admitting: Occupational Therapy

## 2022-07-04 ENCOUNTER — Ambulatory Visit (HOSPITAL_COMMUNITY): Payer: Medicaid Other

## 2022-07-08 ENCOUNTER — Encounter (HOSPITAL_COMMUNITY): Payer: Medicaid Other | Admitting: Occupational Therapy

## 2022-07-08 ENCOUNTER — Ambulatory Visit (HOSPITAL_COMMUNITY): Payer: Medicaid Other

## 2022-07-08 ENCOUNTER — Encounter (HOSPITAL_COMMUNITY): Payer: Medicaid Other | Admitting: Speech Pathology

## 2022-07-10 ENCOUNTER — Encounter (HOSPITAL_COMMUNITY): Payer: Medicaid Other | Admitting: Speech Pathology

## 2022-07-11 ENCOUNTER — Ambulatory Visit (HOSPITAL_COMMUNITY): Payer: Medicaid Other

## 2022-07-11 ENCOUNTER — Encounter (HOSPITAL_COMMUNITY): Payer: Medicaid Other | Admitting: Occupational Therapy

## 2022-07-13 ENCOUNTER — Other Ambulatory Visit: Payer: Self-pay | Admitting: Physical Medicine & Rehabilitation

## 2022-07-13 DIAGNOSIS — F5101 Primary insomnia: Secondary | ICD-10-CM

## 2022-07-15 ENCOUNTER — Ambulatory Visit (HOSPITAL_COMMUNITY): Payer: Medicaid Other

## 2022-07-15 ENCOUNTER — Encounter (HOSPITAL_COMMUNITY): Payer: Medicaid Other | Admitting: Occupational Therapy

## 2022-07-15 ENCOUNTER — Encounter (HOSPITAL_COMMUNITY): Payer: Medicaid Other | Admitting: Speech Pathology

## 2022-07-17 ENCOUNTER — Encounter (HOSPITAL_COMMUNITY): Payer: Medicaid Other | Admitting: Speech Pathology

## 2022-07-18 ENCOUNTER — Encounter (HOSPITAL_COMMUNITY): Payer: Medicaid Other | Admitting: Occupational Therapy

## 2022-07-18 ENCOUNTER — Ambulatory Visit (HOSPITAL_COMMUNITY): Payer: Medicaid Other

## 2022-07-22 ENCOUNTER — Encounter (HOSPITAL_COMMUNITY): Payer: Medicaid Other | Admitting: Occupational Therapy

## 2022-07-22 ENCOUNTER — Encounter (HOSPITAL_COMMUNITY): Payer: Medicaid Other | Admitting: Speech Pathology

## 2022-07-22 ENCOUNTER — Ambulatory Visit (HOSPITAL_COMMUNITY): Payer: Medicaid Other

## 2022-07-24 ENCOUNTER — Encounter (HOSPITAL_COMMUNITY): Payer: Medicaid Other | Admitting: Speech Pathology

## 2022-07-25 ENCOUNTER — Ambulatory Visit (HOSPITAL_COMMUNITY): Payer: Medicaid Other

## 2022-07-25 ENCOUNTER — Encounter (HOSPITAL_COMMUNITY): Payer: Medicaid Other | Admitting: Occupational Therapy

## 2022-07-29 ENCOUNTER — Ambulatory Visit (HOSPITAL_COMMUNITY): Payer: Medicaid Other

## 2022-07-29 ENCOUNTER — Encounter (HOSPITAL_COMMUNITY): Payer: Medicaid Other | Admitting: Occupational Therapy

## 2022-07-29 ENCOUNTER — Encounter (HOSPITAL_COMMUNITY): Payer: Medicaid Other | Admitting: Speech Pathology

## 2022-07-31 ENCOUNTER — Encounter (HOSPITAL_COMMUNITY): Payer: Medicaid Other | Admitting: Speech Pathology

## 2022-08-06 ENCOUNTER — Ambulatory Visit: Payer: Medicaid Other | Admitting: Physical Medicine & Rehabilitation

## 2022-08-15 ENCOUNTER — Other Ambulatory Visit: Payer: Self-pay | Admitting: Physical Medicine & Rehabilitation

## 2022-08-20 ENCOUNTER — Ambulatory Visit: Payer: Medicaid Other | Admitting: Internal Medicine

## 2022-09-02 ENCOUNTER — Ambulatory Visit: Payer: Medicaid Other | Admitting: Internal Medicine

## 2022-09-12 ENCOUNTER — Other Ambulatory Visit: Payer: Self-pay

## 2022-09-12 DIAGNOSIS — Z2821 Immunization not carried out because of patient refusal: Secondary | ICD-10-CM

## 2022-10-13 ENCOUNTER — Encounter (HOSPITAL_COMMUNITY): Payer: Self-pay | Admitting: Emergency Medicine

## 2022-10-13 ENCOUNTER — Emergency Department (HOSPITAL_COMMUNITY)
Admission: EM | Admit: 2022-10-13 | Discharge: 2022-10-13 | Disposition: A | Discharge status: Home / Self Care | Payer: Medicaid Other | Attending: Emergency Medicine | Admitting: Emergency Medicine

## 2022-10-13 ENCOUNTER — Emergency Department (HOSPITAL_COMMUNITY): Payer: Medicaid Other

## 2022-10-13 ENCOUNTER — Other Ambulatory Visit: Payer: Self-pay

## 2022-10-13 DIAGNOSIS — M25552 Pain in left hip: Secondary | ICD-10-CM | POA: Diagnosis not present

## 2022-10-13 DIAGNOSIS — Z79899 Other long term (current) drug therapy: Secondary | ICD-10-CM | POA: Insufficient documentation

## 2022-10-13 DIAGNOSIS — R4781 Slurred speech: Secondary | ICD-10-CM | POA: Diagnosis not present

## 2022-10-13 DIAGNOSIS — M79672 Pain in left foot: Secondary | ICD-10-CM | POA: Insufficient documentation

## 2022-10-13 DIAGNOSIS — T7411XA Adult physical abuse, confirmed, initial encounter: Secondary | ICD-10-CM | POA: Diagnosis not present

## 2022-10-13 DIAGNOSIS — Z7982 Long term (current) use of aspirin: Secondary | ICD-10-CM | POA: Diagnosis not present

## 2022-10-13 DIAGNOSIS — R4182 Altered mental status, unspecified: Secondary | ICD-10-CM | POA: Diagnosis not present

## 2022-10-13 DIAGNOSIS — R55 Syncope and collapse: Secondary | ICD-10-CM | POA: Diagnosis not present

## 2022-10-13 MED ORDER — LORAZEPAM 2 MG/ML IJ SOLN: 1.0000 mg | INTRAMUSCULAR | Status: DC | PRN

## 2022-10-13 MED ORDER — LORAZEPAM 1 MG PO TABS: 1.0000 mg | ORAL_TABLET | ORAL | Status: DC | PRN

## 2022-10-13 MED ORDER — LACTATED RINGERS IV BOLUS: 1000.0000 mL | Freq: Once | INTRAVENOUS | Status: DC
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
## 2022-11-29 ENCOUNTER — Other Ambulatory Visit: Payer: Self-pay | Admitting: Internal Medicine

## 2022-11-29 DIAGNOSIS — I10 Essential (primary) hypertension: Secondary | ICD-10-CM

## 2022-12-08 ENCOUNTER — Encounter (HOSPITAL_COMMUNITY): Payer: Self-pay | Admitting: Speech Pathology

## 2022-12-08 NOTE — Therapy (Signed)
[facility] at [facility] [address] [place], [place], [address] Phone: [phone]   Fax:  [phone]  December 08, 2022   No Recipients  Speech Language Pathology Therapy Discharge Summary   Patient: [name]  MRN: [id number]  Date of Birth: 03-17-1981   Diagnosis: No diagnosis found. No data recorded  The above patient had been seen in Speech Language Pathology 0 times of 4 treatments scheduled with 4 no shows/cancellations.  The treatment consisted of N/A The patient is:  Unknown due to failure to return to clinic after the evaluation.  Subjective: N/A  Discharge Findings: N/A  Functional Status at Discharge: Unknown  No Goals Met       Sincerely,  Thank you,  Havery Moros, CCC-SLP [phone]  Jayshawn Colston, CCC-SLP    CC No Recipients  [facility] at [facility] [address] [place], [place], [address] Phone: [phone]   Fax:  [phone]

## 2023-12-09 ENCOUNTER — Other Ambulatory Visit: Payer: Self-pay

## 2023-12-09 ENCOUNTER — Encounter (HOSPITAL_COMMUNITY): Payer: Self-pay

## 2023-12-09 ENCOUNTER — Emergency Department (HOSPITAL_COMMUNITY)
Admission: EM | Admit: 2023-12-09 | Discharge: 2023-12-09 | Disposition: A | Discharge status: Home / Self Care | Attending: Emergency Medicine | Admitting: Emergency Medicine

## 2023-12-09 DIAGNOSIS — I129 Hypertensive chronic kidney disease with stage 1 through stage 4 chronic kidney disease, or unspecified chronic kidney disease: Secondary | ICD-10-CM | POA: Diagnosis not present

## 2023-12-09 DIAGNOSIS — Z760 Encounter for issue of repeat prescription: Secondary | ICD-10-CM | POA: Insufficient documentation

## 2023-12-09 DIAGNOSIS — N189 Chronic kidney disease, unspecified: Secondary | ICD-10-CM | POA: Insufficient documentation

## 2023-12-09 DIAGNOSIS — M79662 Pain in left lower leg: Secondary | ICD-10-CM | POA: Insufficient documentation

## 2023-12-09 DIAGNOSIS — M79605 Pain in left leg: Secondary | ICD-10-CM | POA: Diagnosis present

## 2023-12-09 HISTORY — DX: Polyneuropathy, unspecified: G62.9

## 2023-12-09 LAB — CBC WITH DIFFERENTIAL/PLATELET
Abs Immature Granulocytes: 0.03 K/uL (ref 0.00–0.07)
Basophils Absolute: 0.1 K/uL (ref 0.0–0.1)
Basophils Relative: 1 %
Eosinophils Absolute: 0.4 K/uL (ref 0.0–0.5)
Eosinophils Relative: 6 %
HCT: 46.1 % (ref 39.0–52.0)
Hemoglobin: 16.6 g/dL (ref 13.0–17.0)
Immature Granulocytes: 1 %
Lymphocytes Relative: 25 %
Lymphs Abs: 1.4 K/uL (ref 0.7–4.0)
MCH: 33.7 pg (ref 26.0–34.0)
MCHC: 36 g/dL (ref 30.0–36.0)
MCV: 93.5 fL (ref 80.0–100.0)
Monocytes Absolute: 0.6 K/uL (ref 0.1–1.0)
Monocytes Relative: 11 %
Neutro Abs: 3.3 K/uL (ref 1.7–7.7)
Neutrophils Relative %: 56 %
Platelets: 127 K/uL — ABNORMAL LOW (ref 150–400)
RBC: 4.93 MIL/uL (ref 4.22–5.81)
RDW: 15.5 % (ref 11.5–15.5)
WBC: 5.8 K/uL (ref 4.0–10.5)
nRBC: 0 % (ref 0.0–0.2)

## 2023-12-09 LAB — COMPREHENSIVE METABOLIC PANEL WITH GFR
ALT: 16 U/L (ref 0–44)
AST: 26 U/L (ref 15–41)
Albumin: 3.8 g/dL (ref 3.5–5.0)
Alkaline Phosphatase: 65 U/L (ref 38–126)
Anion gap: 11 (ref 5–15)
BUN: 9 mg/dL (ref 6–20)
CO2: 27 mmol/L (ref 22–32)
Calcium: 9 mg/dL (ref 8.9–10.3)
Chloride: 104 mmol/L (ref 98–111)
Creatinine, Ser: 1.07 mg/dL (ref 0.61–1.24)
GFR, Estimated: >60 mL/min (ref >60)
Glucose, Bld: 79 mg/dL (ref 70–99)
Potassium: 3.5 mmol/L (ref 3.5–5.1)
Sodium: 142 mmol/L (ref 135–145)
Total Bilirubin: 1.1 mg/dL (ref 0.0–1.2)
Total Protein: 6.8 g/dL (ref 6.5–8.1)

## 2023-12-09 MED ORDER — AMLODIPINE BESYLATE 5 MG PO TABS: 5.0000 mg | ORAL_TABLET | Freq: Every day | ORAL | 0 refills | Status: AC

## 2023-12-09 MED ORDER — ASPIRIN 81 MG PO CHEW: 81.0000 mg | CHEWABLE_TABLET | Freq: Every day | ORAL | 0 refills | Status: DC

## 2023-12-09 MED ORDER — OXYCODONE HCL 5 MG PO TABS: 5.0000 mg | ORAL_TABLET | Freq: Four times a day (QID) | ORAL | 0 refills | Status: DC | PRN

## 2023-12-09 MED ORDER — ALBUTEROL SULFATE HFA 108 (90 BASE) MCG/ACT IN AERS: 1.0000 | INHALATION_SPRAY | Freq: Four times a day (QID) | RESPIRATORY_TRACT | 0 refills | Status: AC | PRN

## 2023-12-09 MED ORDER — METHOCARBAMOL 500 MG PO TABS: 500.0000 mg | ORAL_TABLET | Freq: Two times a day (BID) | ORAL | 0 refills | Status: DC

## 2023-12-09 NOTE — Discharge Instructions (Addendum)
 It was a pleasure taking care of you today.  Based on your history and physical exam I feel you are safe for discharge.  Unfortunately today I was not able to fill out your disability paperwork, please use the below resources and follow-up with a primary care provider who may better be able to assist you in this area.  I have also refilled some of your medications.  I have refilled your albuterol , aspirin , Robaxin , as well as amlodipine .  Please take these medications as prescribed.  Please remember that Robaxin  is a muscle relaxant and you cannot drive or operate heavy machinery after taking it as it may make you drowsy.  I have also given you 4 tablets of oxycodone , please use these only for breakthrough pain and do not drive after taking this medication or operate heavy machinery, please do not take the oxycodone  and robaxin  together as they can both make you drowsy.  You will have to follow-up with a primary care provider to get any more narcotic pain medication.  If you experience any of the following symptoms including but limited to fever, chills, chest pain, shortness of breath, abdominal pain, nausea/vomiting, severe pain, inability to walk, or other concerning symptom please return to the emergency department.  Recommend you establish with a primary care provider for ongoing diagnosis and treatment as soon as possible.  Please use the resources below and call around and see who has the soonest appointment.   Providers Accepting New Patients in [place], [place]    [facility] [address]  [place], [place] [address]J [phone] Accepts most insurances  [facility] [address] [place], [place] [address] [phone] Accepts most insurances  [facility] [address] [place], [place] [address]  [phone] Must meet requirements  [facility] [address] [place], [place] [address] [phone] Accepts most  insurances  [facility] [address]  [place], [place] [address] [phone] Accepts most insurances  [facility] [address]   [place], [place]   [phone] Accepts most insurances  [facility])  [phone] [address]  [place], [place] [address] [phone] Accepts most insurances     [facility] [address]  [place], [place] [address] [phone] Accepts most insurances  [facility] [address] [place], [place] [address] [phone] option 1 Accepts Medicaid and [facility] [address]  [place], [place] [address] [phone] Accepts most insurances  Benita Outhouse, MD [address] [place], [place] [address] [phone] Accepts most insurances  [facility] at [facility] [address]  [place], [place] [address] [phone] Accepts most insurances  [facility] [phone] [address] [place], [place] [address] [phone] Accepts most insurances  [place], [place] [address] [place], [place] [address] [phone]  Accepts most insurances
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
## 2023-12-11 NOTE — ED Notes (Addendum)
 This note was filed on 12/11/2023 at 1155  This RN entered chart to make calls to pharmacy. PT states pharmacy did not receive medication prescriptions.

## 2023-12-11 NOTE — ED Notes (Addendum)
 This note was filed on 9/12//2025 at 1220   Pharmacy stated pt is locked in by medicaid to 1 prescribing provider and 1 filling pharmacy for narcotic medications. Pharmacy stated pt needs to call medicaid and find out where and when he can have narcotics filled. RN called patient and advised all of the information. Pt verbalized understanding.

## 2023-12-22 ENCOUNTER — Ambulatory Visit: Admitting: Nurse Practitioner

## 2023-12-22 ENCOUNTER — Ambulatory Visit: Payer: Self-pay

## 2023-12-22 NOTE — Telephone Encounter (Signed)
 FYI Only or Action Required?: FYI only for provider.  Patient was last seen in primary care on 06/03/2022 by Melvenia Manus BRAVO, MD.  Called Nurse Triage reporting Leg Pain.  Symptoms began several days ago.  Interventions attempted: Rest, hydration, or home remedies.  Symptoms are: gradually worsening.  Triage Disposition: No disposition on file.  Patient/caregiver understands and will follow disposition?:     Copied from CRM [id number]. Topic: Clinical - Red Word Triage >> Dec 22, 2023 10:55 AM Turkey B wrote: [facility] that prompted transfer to Nurse Triage: Patient has severe pain in left leg Reason for Disposition  [1] MODERATE pain (e.g., interferes with normal activities, limping) AND [2] present > 3 days  Answer Assessment - Initial Assessment Questions 1. ONSET: When did the pain start?       Chronic neuropathy-worsening pain 2. LOCATION: Where is the pain located?      Left leg 3. PAIN: How bad is the pain?    (Scale 1-10; or mild, moderate, severe)     severe able to ambulate 4. WORK OR EXERCISE: Has there been any recent work or exercise that involved this part of the body?      no 5. CAUSE: What do you think is causing the leg pain?     neuropathy 6. OTHER SYMPTOMS: Do you have any other symptoms? (e.g., chest pain, back pain, breathing difficulty, swelling, rash, fever, numbness, weakness)     denies  Protocols used: Leg Pain-A-AH

## 2023-12-24 ENCOUNTER — Ambulatory Visit: Payer: Self-pay

## 2023-12-28 ENCOUNTER — Ambulatory Visit: Payer: Self-pay | Admitting: Family Medicine

## 2023-12-28 ENCOUNTER — Ambulatory Visit: Payer: Self-pay
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
## 2023-12-28 NOTE — Telephone Encounter (Signed)
 Called [facility], Shannon, reports do not have any appts available.

## 2024-01-02 ENCOUNTER — Inpatient Hospital Stay (HOSPITAL_COMMUNITY)

## 2024-01-02 ENCOUNTER — Encounter (HOSPITAL_COMMUNITY): Payer: Self-pay | Admitting: Emergency Medicine

## 2024-01-02 ENCOUNTER — Other Ambulatory Visit: Payer: Self-pay

## 2024-01-02 ENCOUNTER — Emergency Department (HOSPITAL_COMMUNITY)

## 2024-01-02 ENCOUNTER — Encounter (HOSPITAL_COMMUNITY): Admission: EM | Disposition: A | Discharge status: Home / Self Care | Payer: Self-pay | Source: Home / Self Care

## 2024-01-02 ENCOUNTER — Inpatient Hospital Stay (HOSPITAL_COMMUNITY)
Admission: EM | Admit: 2024-01-02 | Discharge: 2024-01-04 | DRG: 083 | Disposition: A | Discharge status: Home / Self Care | Attending: General Surgery | Admitting: General Surgery

## 2024-01-02 DIAGNOSIS — G629 Polyneuropathy, unspecified: Secondary | ICD-10-CM | POA: Diagnosis present

## 2024-01-02 DIAGNOSIS — R220 Localized swelling, mass and lump, head: Secondary | ICD-10-CM | POA: Diagnosis not present

## 2024-01-02 DIAGNOSIS — Z23 Encounter for immunization: Secondary | ICD-10-CM

## 2024-01-02 DIAGNOSIS — E872 Acidosis, unspecified: Secondary | ICD-10-CM | POA: Diagnosis present

## 2024-01-02 DIAGNOSIS — S80212A Abrasion, left knee, initial encounter: Secondary | ICD-10-CM | POA: Diagnosis present

## 2024-01-02 DIAGNOSIS — S0240DA Maxillary fracture, left side, initial encounter for closed fracture: Secondary | ICD-10-CM

## 2024-01-02 DIAGNOSIS — I10 Essential (primary) hypertension: Secondary | ICD-10-CM | POA: Diagnosis present

## 2024-01-02 DIAGNOSIS — S02609A Fracture of mandible, unspecified, initial encounter for closed fracture: Secondary | ICD-10-CM | POA: Diagnosis present

## 2024-01-02 DIAGNOSIS — Z8249 Family history of ischemic heart disease and other diseases of the circulatory system: Secondary | ICD-10-CM

## 2024-01-02 DIAGNOSIS — E870 Hyperosmolality and hypernatremia: Secondary | ICD-10-CM | POA: Diagnosis present

## 2024-01-02 DIAGNOSIS — S02652B Fracture of angle of left mandible, initial encounter for open fracture: Secondary | ICD-10-CM | POA: Diagnosis present

## 2024-01-02 DIAGNOSIS — F17210 Nicotine dependence, cigarettes, uncomplicated: Secondary | ICD-10-CM | POA: Diagnosis present

## 2024-01-02 DIAGNOSIS — Z79899 Other long term (current) drug therapy: Secondary | ICD-10-CM | POA: Diagnosis not present

## 2024-01-02 DIAGNOSIS — Z8673 Personal history of transient ischemic attack (TIA), and cerebral infarction without residual deficits: Secondary | ICD-10-CM | POA: Diagnosis not present

## 2024-01-02 DIAGNOSIS — E871 Hypo-osmolality and hyponatremia: Secondary | ICD-10-CM | POA: Diagnosis present

## 2024-01-02 DIAGNOSIS — Z881 Allergy status to other antibiotic agents status: Secondary | ICD-10-CM | POA: Diagnosis not present

## 2024-01-02 DIAGNOSIS — S066XAA Traumatic subarachnoid hemorrhage with loss of consciousness status unknown, initial encounter: Principal | ICD-10-CM | POA: Diagnosis present

## 2024-01-02 DIAGNOSIS — Z7982 Long term (current) use of aspirin: Secondary | ICD-10-CM | POA: Diagnosis not present

## 2024-01-02 DIAGNOSIS — S02652A Fracture of angle of left mandible, initial encounter for closed fracture: Secondary | ICD-10-CM

## 2024-01-02 DIAGNOSIS — Z880 Allergy status to penicillin: Secondary | ICD-10-CM

## 2024-01-02 DIAGNOSIS — Z743 Need for continuous supervision: Secondary | ICD-10-CM | POA: Diagnosis not present

## 2024-01-02 HISTORY — PX: ORIF MANDIBULAR FRACTURE: SHX2127

## 2024-01-02 LAB — ETHANOL: Alcohol, Ethyl (B): 307 mg/dL (ref <15)

## 2024-01-02 LAB — SAMPLE TO BLOOD BANK

## 2024-01-02 LAB — BASIC METABOLIC PANEL WITH GFR
Anion gap: 15 (ref 5–15)
BUN: 5 mg/dL — ABNORMAL LOW (ref 6–20)
CO2: 18 mmol/L — ABNORMAL LOW (ref 22–32)
Calcium: 8 mg/dL — ABNORMAL LOW (ref 8.9–10.3)
Chloride: 107 mmol/L (ref 98–111)
Creatinine, Ser: 0.78 mg/dL (ref 0.61–1.24)
GFR, Estimated: >60 mL/min (ref >60)
Glucose, Bld: 114 mg/dL — ABNORMAL HIGH (ref 70–99)
Potassium: 4.1 mmol/L (ref 3.5–5.1)
Sodium: 140 mmol/L (ref 135–145)

## 2024-01-02 LAB — COMPREHENSIVE METABOLIC PANEL WITH GFR
ALT: 42 U/L (ref 0–44)
AST: 43 U/L — ABNORMAL HIGH (ref 15–41)
Albumin: 4.3 g/dL (ref 3.5–5.0)
Alkaline Phosphatase: 89 U/L (ref 38–126)
Anion gap: 21 — ABNORMAL HIGH (ref 5–15)
BUN: 8 mg/dL (ref 6–20)
CO2: 19 mmol/L — ABNORMAL LOW (ref 22–32)
Calcium: 8.4 mg/dL — ABNORMAL LOW (ref 8.9–10.3)
Chloride: 108 mmol/L (ref 98–111)
Creatinine, Ser: 0.92 mg/dL (ref 0.61–1.24)
GFR, Estimated: >60 mL/min (ref >60)
Glucose, Bld: 109 mg/dL — ABNORMAL HIGH (ref 70–99)
Potassium: 3.8 mmol/L (ref 3.5–5.1)
Sodium: 149 mmol/L — ABNORMAL HIGH (ref 135–145)
Total Bilirubin: 0.5 mg/dL (ref 0.0–1.2)
Total Protein: 6.7 g/dL (ref 6.5–8.1)

## 2024-01-02 LAB — CBC
HCT: 50.6 % (ref 39.0–52.0)
HCT: 47.8 % (ref 39.0–52.0)
Hemoglobin: 18.1 g/dL — ABNORMAL HIGH (ref 13.0–17.0)
Hemoglobin: 16.8 g/dL (ref 13.0–17.0)
MCH: 34 pg (ref 26.0–34.0)
MCH: 33.8 pg (ref 26.0–34.0)
MCHC: 35.8 g/dL (ref 30.0–36.0)
MCHC: 35.1 g/dL (ref 30.0–36.0)
MCV: 95.1 fL (ref 80.0–100.0)
MCV: 96.2 fL (ref 80.0–100.0)
Platelets: 159 K/uL (ref 150–400)
Platelets: 137 K/uL — ABNORMAL LOW (ref 150–400)
RBC: 5.32 MIL/uL (ref 4.22–5.81)
RBC: 4.97 MIL/uL (ref 4.22–5.81)
RDW: 15.9 % — ABNORMAL HIGH (ref 11.5–15.5)
RDW: 15.9 % — ABNORMAL HIGH (ref 11.5–15.5)
WBC: 12 K/uL — ABNORMAL HIGH (ref 4.0–10.5)
WBC: 8.8 K/uL (ref 4.0–10.5)
nRBC: 0 % (ref 0.0–0.2)
nRBC: 0 % (ref 0.0–0.2)

## 2024-01-02 LAB — PROTIME-INR
INR: 1 (ref 0.8–1.2)
Prothrombin Time: 13.3 s (ref 11.4–15.2)

## 2024-01-02 LAB — HIV ANTIBODY (ROUTINE TESTING W REFLEX): HIV Screen 4th Generation wRfx: NONREACTIVE

## 2024-01-02 LAB — SURGICAL PCR SCREEN
MRSA, PCR: NEGATIVE
Staphylococcus aureus: NEGATIVE

## 2024-01-02 LAB — LACTIC ACID, PLASMA: Lactic Acid, Venous: 4.1 mmol/L (ref 0.5–1.9)

## 2024-01-02 SURGERY — OPEN REDUCTION INTERNAL FIXATION (ORIF) MANDIBULAR FRACTURE
Anesthesia: General | Laterality: Left

## 2024-01-02 MED ORDER — DOCUSATE SODIUM 50 MG/5ML PO LIQD
100.0000 mg | Freq: Two times a day (BID) | ORAL | Status: DC
  Administered 2024-01-02 – 2024-01-04 (×4): 100 mg via ORAL
  Filled 2024-01-02 (×4): qty 10

## 2024-01-02 MED ORDER — HYDROMORPHONE HCL 1 MG/ML IJ SOLN
INTRAMUSCULAR | Status: AC
  Filled 2024-01-02: qty 1

## 2024-01-02 MED ORDER — HYDROMORPHONE HCL 1 MG/ML IJ SOLN
1.0000 mg | Freq: Once | INTRAMUSCULAR | Status: AC
  Administered 2024-01-02: 1 mg via INTRAVENOUS
  Filled 2024-01-02: qty 1

## 2024-01-02 MED ORDER — FENTANYL CITRATE (PF) 250 MCG/5ML IJ SOLN
INTRAMUSCULAR | Status: DC | PRN
  Administered 2024-01-02: 50 ug via INTRAVENOUS
  Administered 2024-01-02: 75 ug via INTRAVENOUS
  Administered 2024-01-02: 125 ug via INTRAVENOUS

## 2024-01-02 MED ORDER — MIDAZOLAM HCL 2 MG/2ML IJ SOLN
INTRAMUSCULAR | Status: DC | PRN
Start: 2024-01-02 — End: 2024-01-02
  Administered 2024-01-02: 2 mg via INTRAVENOUS

## 2024-01-02 MED ORDER — FENTANYL CITRATE (PF) 250 MCG/5ML IJ SOLN
INTRAMUSCULAR | Status: AC
  Filled 2024-01-02: qty 5

## 2024-01-02 MED ORDER — HYDROMORPHONE HCL 1 MG/ML IJ SOLN
1.0000 mg | INTRAMUSCULAR | Status: AC | PRN
  Administered 2024-01-02 (×2): 1 mg via INTRAVENOUS
  Filled 2024-01-02 (×2): qty 1

## 2024-01-02 MED ORDER — OXYCODONE HCL 5 MG/5ML PO SOLN: 5.0000 mg | Freq: Once | ORAL | Status: DC | PRN

## 2024-01-02 MED ORDER — HYDRALAZINE HCL 20 MG/ML IJ SOLN: 10.0000 mg | INTRAMUSCULAR | Status: DC | PRN

## 2024-01-02 MED ORDER — IOHEXOL 300 MG/ML  SOLN
100.0000 mL | Freq: Once | INTRAMUSCULAR | Status: AC | PRN
  Administered 2024-01-02: 100 mL via INTRAVENOUS

## 2024-01-02 MED ORDER — THIAMINE HCL 100 MG/ML IJ SOLN
100.0000 mg | Freq: Every day | INTRAMUSCULAR | Status: DC
  Filled 2024-01-02: qty 2

## 2024-01-02 MED ORDER — PROPOFOL 10 MG/ML IV BOLUS
INTRAVENOUS | Status: AC
  Filled 2024-01-02: qty 20

## 2024-01-02 MED ORDER — SUGAMMADEX SODIUM 200 MG/2ML IV SOLN
INTRAVENOUS | Status: DC | PRN
Start: 2024-01-02 — End: 2024-01-02
  Administered 2024-01-02: 200 mg via INTRAVENOUS

## 2024-01-02 MED ORDER — CLINDAMYCIN PHOSPHATE 300 MG/50ML IV SOLN
300.0000 mg | Freq: Once | INTRAVENOUS | Status: AC
  Administered 2024-01-02: 300 mg via INTRAVENOUS
  Filled 2024-01-02 (×3): qty 50

## 2024-01-02 MED ORDER — DULOXETINE HCL 60 MG PO CPEP
60.0000 mg | ORAL_CAPSULE | Freq: Every day | ORAL | Status: DC
Start: 2024-01-02 — End: 2024-01-04
  Administered 2024-01-03 – 2024-01-04 (×2): 60 mg via ORAL
  Filled 2024-01-02 (×2): qty 1

## 2024-01-02 MED ORDER — SODIUM CHLORIDE 0.9 % IV BOLUS
1000.0000 mL | Freq: Once | INTRAVENOUS | Status: AC
  Administered 2024-01-02: 1000 mL via INTRAVENOUS

## 2024-01-02 MED ORDER — DEXAMETHASONE SODIUM PHOSPHATE 10 MG/ML IJ SOLN
INTRAMUSCULAR | Status: DC | PRN
  Administered 2024-01-02: 10 mg via INTRAVENOUS

## 2024-01-02 MED ORDER — FENTANYL CITRATE (PF) 100 MCG/2ML IJ SOLN
25.0000 ug | Freq: Once | INTRAMUSCULAR | Status: AC
  Administered 2024-01-02: 25 ug via INTRAVENOUS

## 2024-01-02 MED ORDER — SODIUM CHLORIDE 0.9 % IV SOLN: INTRAVENOUS | Status: DC

## 2024-01-02 MED ORDER — 0.9 % SODIUM CHLORIDE (POUR BTL) OPTIME
TOPICAL | Status: DC | PRN
  Administered 2024-01-02: 1000 mL

## 2024-01-02 MED ORDER — HYDROMORPHONE HCL 1 MG/ML IJ SOLN
0.2500 mg | INTRAMUSCULAR | Status: DC | PRN
  Administered 2024-01-02 (×3): 0.5 mg via INTRAVENOUS

## 2024-01-02 MED ORDER — ROCURONIUM BROMIDE 10 MG/ML (PF) SYRINGE
PREFILLED_SYRINGE | INTRAVENOUS | Status: DC | PRN
  Administered 2024-01-02: 30 mg via INTRAVENOUS
  Administered 2024-01-02: 20 mg via INTRAVENOUS
  Administered 2024-01-02: 50 mg via INTRAVENOUS

## 2024-01-02 MED ORDER — LEVETIRACETAM (KEPPRA) 500 MG/5 ML ADULT IV PUSH
500.0000 mg | Freq: Two times a day (BID) | INTRAVENOUS | Status: DC
  Administered 2024-01-02 – 2024-01-04 (×4): 500 mg via INTRAVENOUS
  Filled 2024-01-02 (×4): qty 5

## 2024-01-02 MED ORDER — LIDOCAINE 2% (20 MG/ML) 5 ML SYRINGE
INTRAMUSCULAR | Status: DC | PRN
  Administered 2024-01-02: 50 mg via INTRAVENOUS

## 2024-01-02 MED ORDER — SODIUM CHLORIDE 0.45 % IV SOLN: INTRAVENOUS | Status: DC

## 2024-01-02 MED ORDER — METOPROLOL TARTRATE 5 MG/5ML IV SOLN: 5.0000 mg | Freq: Four times a day (QID) | INTRAVENOUS | Status: DC | PRN

## 2024-01-02 MED ORDER — MIDAZOLAM HCL 2 MG/2ML IJ SOLN
INTRAMUSCULAR | Status: AC
  Filled 2024-01-02: qty 2

## 2024-01-02 MED ORDER — ONDANSETRON HCL 4 MG/2ML IJ SOLN: 4.0000 mg | Freq: Four times a day (QID) | INTRAMUSCULAR | Status: DC | PRN

## 2024-01-02 MED ORDER — PREGABALIN 75 MG PO CAPS
200.0000 mg | ORAL_CAPSULE | Freq: Three times a day (TID) | ORAL | Status: DC
  Administered 2024-01-02 – 2024-01-04 (×6): 200 mg via ORAL
  Filled 2024-01-02 (×6): qty 2

## 2024-01-02 MED ORDER — MEPERIDINE HCL 25 MG/ML IJ SOLN: 6.2500 mg | INTRAMUSCULAR | Status: DC | PRN

## 2024-01-02 MED ORDER — LORAZEPAM 2 MG/ML IJ SOLN: 1.0000 mg | INTRAMUSCULAR | Status: DC | PRN

## 2024-01-02 MED ORDER — DEXMEDETOMIDINE HCL IN NACL 80 MCG/20ML IV SOLN
INTRAVENOUS | Status: AC
  Filled 2024-01-02: qty 20

## 2024-01-02 MED ORDER — TRAZODONE HCL 100 MG PO TABS
100.0000 mg | ORAL_TABLET | Freq: Every day | ORAL | Status: DC
  Administered 2024-01-02 – 2024-01-03 (×2): 100 mg via ORAL
  Filled 2024-01-02 (×2): qty 1

## 2024-01-02 MED ORDER — FENTANYL CITRATE (PF) 100 MCG/2ML IJ SOLN
INTRAMUSCULAR | Status: AC
  Filled 2024-01-02: qty 2

## 2024-01-02 MED ORDER — LIDOCAINE HCL (PF) 1 % IJ SOLN
10.0000 mL | Freq: Once | INTRAMUSCULAR | Status: AC
  Administered 2024-01-02: 10 mL
  Filled 2024-01-02: qty 10

## 2024-01-02 MED ORDER — LORAZEPAM 1 MG PO TABS
1.0000 mg | ORAL_TABLET | ORAL | Status: DC | PRN
  Administered 2024-01-03: 1 mg via ORAL
  Filled 2024-01-02: qty 1

## 2024-01-02 MED ORDER — ALBUTEROL SULFATE (2.5 MG/3ML) 0.083% IN NEBU: 2.5000 mL | INHALATION_SOLUTION | Freq: Four times a day (QID) | RESPIRATORY_TRACT | Status: DC | PRN

## 2024-01-02 MED ORDER — DEXMEDETOMIDINE HCL IN NACL 80 MCG/20ML IV SOLN
INTRAVENOUS | Status: DC | PRN
  Administered 2024-01-02 (×3): 8 ug via INTRAVENOUS

## 2024-01-02 MED ORDER — DROPERIDOL 2.5 MG/ML IJ SOLN: 0.6250 mg | Freq: Once | INTRAMUSCULAR | Status: DC | PRN

## 2024-01-02 MED ORDER — ACETAMINOPHEN 160 MG/5ML PO SOLN
650.0000 mg | Freq: Four times a day (QID) | ORAL | Status: DC | PRN
  Administered 2024-01-02: 650 mg via ORAL
  Filled 2024-01-02: qty 20.3

## 2024-01-02 MED ORDER — LIDOCAINE-EPINEPHRINE 1 %-1:100000 IJ SOLN
INTRAMUSCULAR | Status: AC
  Filled 2024-01-02: qty 1

## 2024-01-02 MED ORDER — PROPOFOL 10 MG/ML IV BOLUS
INTRAVENOUS | Status: DC | PRN
  Administered 2024-01-02: 200 mg via INTRAVENOUS

## 2024-01-02 MED ORDER — FENTANYL CITRATE (PF) 100 MCG/2ML IJ SOLN
50.0000 ug | Freq: Once | INTRAMUSCULAR | Status: DC
Start: 2024-01-02 — End: 2024-01-02

## 2024-01-02 MED ORDER — OXYMETAZOLINE HCL 0.05 % NA SOLN
NASAL | Status: DC | PRN
  Administered 2024-01-02: 4 via NASAL

## 2024-01-02 MED ORDER — LACTATED RINGERS IV SOLN: INTRAVENOUS | Status: DC

## 2024-01-02 MED ORDER — CHLORHEXIDINE GLUCONATE 0.12 % MT SOLN
OROMUCOSAL | Status: AC
  Filled 2024-01-02: qty 15

## 2024-01-02 MED ORDER — LACTATED RINGERS IV SOLN
INTRAVENOUS | Status: DC
Start: 2024-01-02 — End: 2024-01-02

## 2024-01-02 MED ORDER — LIDOCAINE-EPINEPHRINE 1 %-1:100000 IJ SOLN
INTRAMUSCULAR | Status: DC | PRN
  Administered 2024-01-02: 10 mL

## 2024-01-02 MED ORDER — POLYETHYLENE GLYCOL 3350 17 G PO PACK: 17.0000 g | PACK | Freq: Every day | ORAL | Status: DC | PRN

## 2024-01-02 MED ORDER — LACTATED RINGERS IV SOLN: INTRAVENOUS | Status: DC | PRN

## 2024-01-02 MED ORDER — METHOCARBAMOL 500 MG PO TABS
500.0000 mg | ORAL_TABLET | Freq: Three times a day (TID) | ORAL | Status: DC
  Administered 2024-01-02 – 2024-01-04 (×3): 500 mg via ORAL
  Filled 2024-01-02 (×6): qty 1

## 2024-01-02 MED ORDER — ADULT MULTIVITAMIN W/MINERALS CH
1.0000 | ORAL_TABLET | Freq: Every day | ORAL | Status: DC
  Administered 2024-01-03 – 2024-01-04 (×2): 1 via ORAL
  Filled 2024-01-02 (×2): qty 1

## 2024-01-02 MED ORDER — ONDANSETRON 4 MG PO TBDP: 4.0000 mg | ORAL_TABLET | Freq: Four times a day (QID) | ORAL | Status: DC | PRN

## 2024-01-02 MED ORDER — METHOCARBAMOL 1000 MG/10ML IJ SOLN: 500.0000 mg | Freq: Three times a day (TID) | INTRAMUSCULAR | Status: DC

## 2024-01-02 MED ORDER — MORPHINE SULFATE (PF) 2 MG/ML IV SOLN
2.0000 mg | INTRAVENOUS | Status: DC | PRN
  Administered 2024-01-02 – 2024-01-04 (×7): 2 mg via INTRAVENOUS
  Filled 2024-01-02 (×7): qty 1

## 2024-01-02 MED ORDER — THIAMINE MONONITRATE 100 MG PO TABS
100.0000 mg | ORAL_TABLET | Freq: Every day | ORAL | Status: DC
  Administered 2024-01-03 – 2024-01-04 (×2): 100 mg via ORAL
  Filled 2024-01-02 (×2): qty 1

## 2024-01-02 MED ORDER — CLINDAMYCIN PHOSPHATE 300 MG/50ML IV SOLN
INTRAVENOUS | Status: DC | PRN
  Administered 2024-01-02: 300 mg via INTRAVENOUS

## 2024-01-02 MED ORDER — CHLORHEXIDINE GLUCONATE 0.12 % MT SOLN: 15.0000 mL | Freq: Once | OROMUCOSAL | Status: DC

## 2024-01-02 MED ORDER — DEXAMETHASONE SODIUM PHOSPHATE 10 MG/ML IJ SOLN
10.0000 mg | Freq: Once | INTRAMUSCULAR | Status: AC
  Administered 2024-01-02: 10 mg via INTRAVENOUS
  Filled 2024-01-02: qty 1

## 2024-01-02 MED ORDER — ACETAMINOPHEN 10 MG/ML IV SOLN: 1000.0000 mg | Freq: Once | INTRAVENOUS | Status: DC | PRN

## 2024-01-02 MED ORDER — OXYCODONE HCL 5 MG PO TABS: 5.0000 mg | ORAL_TABLET | Freq: Once | ORAL | Status: DC | PRN

## 2024-01-02 MED ORDER — TETANUS-DIPHTH-ACELL PERTUSSIS 5-2-15.5 LF-MCG/0.5 IM SUSP
0.5000 mL | Freq: Once | INTRAMUSCULAR | Status: AC
  Administered 2024-01-02: 0.5 mL via INTRAMUSCULAR
  Filled 2024-01-02: qty 0.5

## 2024-01-02 MED ORDER — STERILE WATER FOR IRRIGATION IR SOLN
Status: DC | PRN
  Administered 2024-01-02: 1000 mL

## 2024-01-02 MED ORDER — ONDANSETRON HCL 4 MG/2ML IJ SOLN
INTRAMUSCULAR | Status: DC | PRN
  Administered 2024-01-02: 4 mg via INTRAVENOUS

## 2024-01-02 MED ORDER — ORAL CARE MOUTH RINSE: 15.0000 mL | Freq: Once | OROMUCOSAL | Status: DC

## 2024-01-02 MED ORDER — BACITRACIN ZINC 500 UNIT/GM EX OINT
TOPICAL_OINTMENT | CUTANEOUS | Status: AC
  Filled 2024-01-02: qty 28.35

## 2024-01-02 MED ORDER — FOLIC ACID 1 MG PO TABS
1.0000 mg | ORAL_TABLET | Freq: Every day | ORAL | Status: DC
  Administered 2024-01-03 – 2024-01-04 (×2): 1 mg via ORAL
  Filled 2024-01-02 (×2): qty 1

## 2024-01-02 MED ORDER — OXYCODONE HCL 5 MG/5ML PO SOLN
5.0000 mg | ORAL | Status: DC | PRN
  Administered 2024-01-02 – 2024-01-04 (×7): 10 mg via ORAL
  Filled 2024-01-02 (×8): qty 10

## 2024-01-02 SURGICAL SUPPLY — 71 items
BAND DENTAL 1/4IN PULL MED (MISCELLANEOUS) IMPLANT
BAND DENTAL 3/16IN PULL HEAVY (MISCELLANEOUS) IMPLANT
BIT DRILL 1.5X50 7MMSTP W/NTCH (BIT) IMPLANT
BIT DRILL 1.6X50 (BIT) IMPLANT
BLADE SURG 15 STRL LF DISP TIS (BLADE) IMPLANT
CANISTER SUCTION 3000ML PPV (SUCTIONS) ×1 IMPLANT
CLEANER TIP ELECTROSURG 2X2 (MISCELLANEOUS) IMPLANT
CLIP TI MEDIUM 24 (CLIP) IMPLANT
CLIP TI WIDE RED SMALL 24 (CLIP) IMPLANT
CORD BIPOLAR FORCEPS 12FT (ELECTRODE) IMPLANT
COVER SURGICAL LIGHT HANDLE (MISCELLANEOUS) ×1 IMPLANT
DRAPE HALF SHEET 40X57 (DRAPES) IMPLANT
ELECT COATED BLADE 2.86 ST (ELECTRODE) ×1 IMPLANT
ELECT NDL BLADE 2-5/6 (NEEDLE) IMPLANT
ELECT NDL TIP 2.8 STRL (NEEDLE) IMPLANT
ELECT NEEDLE BLADE 2-5/6 (NEEDLE) IMPLANT
ELECT NEEDLE TIP 2.8 STRL (NEEDLE) IMPLANT
ELECTRODE REM PT RTRN 9FT ADLT (ELECTROSURGICAL) ×1 IMPLANT
GAUZE SPONGE 4X4 12PLY STRL (GAUZE/BANDAGES/DRESSINGS) IMPLANT
GLOVE BIO SURGEON STRL SZ7.5 (GLOVE) ×1 IMPLANT
GLOVE BIOGEL PI IND STRL 8 (GLOVE) ×1 IMPLANT
GOWN STRL REUS W/ TWL LRG LVL3 (GOWN DISPOSABLE) ×2 IMPLANT
GOWN STRL REUS W/ TWL XL LVL3 (GOWN DISPOSABLE) ×1 IMPLANT
IV CATH 14GX2 1/4 (CATHETERS) ×1 IMPLANT
KIT BASIN OR (CUSTOM PROCEDURE TRAY) ×1 IMPLANT
KIT TURNOVER KIT B (KITS) ×1 IMPLANT
MARKER SKIN DUAL TIP RULER LAB (MISCELLANEOUS) ×1 IMPLANT
NDL 27GX1/2 REG BEVEL ECLIP (NEEDLE) ×1 IMPLANT
NDL HYPO 25GX1X1/2 BEV (NEEDLE) IMPLANT
NEEDLE 27GX1/2 REG BEVEL ECLIP (NEEDLE) IMPLANT
NEEDLE HYPO 25GX1X1/2 BEV (NEEDLE) ×1 IMPLANT
PACK MINNE TIE 1/18G MMF (WIRE) IMPLANT
PAD ARMBOARD POSITIONER FOAM (MISCELLANEOUS) ×2 IMPLANT
PATTIES SURGICAL .5 X3 (DISPOSABLE) IMPLANT
PENCIL SMOKE EVACUATOR (MISCELLANEOUS) ×1 IMPLANT
PLATE 4H W/GAP 2.0 (Plate) IMPLANT
PLATE LOCK STRT 4H 1/SCREW (Plate) IMPLANT
RETRACTOR STAY HOOK 5MM (MISCELLANEOUS) IMPLANT
SCISSORS WIRE ANG 4 3/4 DISP (INSTRUMENTS) IMPLANT
SCREW LOCKING X-DR 2.0X10 (Screw) IMPLANT
SCREW LOCKING X-DR 2.0X12 (Screw) IMPLANT
SCREW NON LOCK X-DR 2.0X5 (Screw) IMPLANT
SET WALTER ACTIVATION W/DRAPE (SET/KITS/TRAYS/PACK) IMPLANT
SOLN 0.9% NACL 1000 ML (IV SOLUTION) ×1 IMPLANT
SOLN 0.9% NACL POUR BTL 1000ML (IV SOLUTION) ×1 IMPLANT
SOLN STERILE WATER 1000 ML (IV SOLUTION) ×1 IMPLANT
SOLN STERILE WATER BTL 1000 ML (IV SOLUTION) ×1 IMPLANT
SPONGE INTESTINAL PEANUT (DISPOSABLE) IMPLANT
STAPLER SKIN PROX 35W (STAPLE) ×1 IMPLANT
SUT BONE WAX W31G (SUTURE) IMPLANT
SUT CHROMIC 3 0 SH 27 (SUTURE) IMPLANT
SUT ETHILON 3 0 PS 1 (SUTURE) IMPLANT
SUT ETHILON 5 0 PS 2 18 (SUTURE) IMPLANT
SUT MNCRL AB 4-0 PS2 18 (SUTURE) IMPLANT
SUT PLAIN GUT FAST 5-0 (SUTURE) IMPLANT
SUT SILK 2 0 SH (SUTURE) IMPLANT
SUT SILK 2 0SH CR/8 30 (SUTURE) IMPLANT
SUT SILK 3 0 REEL (SUTURE) IMPLANT
SUT SILK 3 0 SH 30 (SUTURE) IMPLANT
SUT SILK 3 0 SH CR/8 (SUTURE) IMPLANT
SUT SILK 3-0 18XBRD TIE 12 (SUTURE) IMPLANT
SUT STEEL 0 18XMFL TIE 17 (SUTURE) IMPLANT
SUT STEEL 2 (SUTURE) IMPLANT
SUT VIC AB 3-0 FS2 27 (SUTURE) IMPLANT
SUT VIC AB 3-0 SH 8-18 (SUTURE) IMPLANT
SUT VIC AB 4-0 P-3 18X BRD (SUTURE) IMPLANT
SUT VIC AB 4-0 RB1 18 (SUTURE) IMPLANT
SUT VICRYL 3-0 RB1 18 ABS (SUTURE) IMPLANT
TOOTHBRUSH ADULT (PERSONAL CARE ITEMS) ×1 IMPLANT
TOWEL GREEN STERILE FF (TOWEL DISPOSABLE) ×1 IMPLANT
TRAY ENT MC OR (CUSTOM PROCEDURE TRAY) ×1 IMPLANT

## 2024-01-02 NOTE — Transfer of Care (Signed)
 Immediate Anesthesia Transfer of Care Note  Patient: [name] [name]  Procedure(s) Performed: OPEN REDUCTION INTERNAL FIXATION (ORIF) MANDIBULAR FRACTURE Maxillomadibular Fixation (Left)  Patient Location: PACU  Anesthesia Type:General  Level of Consciousness: awake, alert , and oriented  Airway & Oxygen Therapy: Patient Spontanous Breathing and Patient connected to face mask oxygen  Post-op Assessment: Report given to RN and Post -op Vital signs reviewed and stable  Post vital signs: Reviewed and stable  Last Vitals:  Vitals Value Taken Time  BP 166/109 01/02/24 15:02  Temp    Pulse 95 01/02/24 15:05  Resp 18 01/02/24 15:05  SpO2 97 % 01/02/24 15:05  Vitals shown include unfiled device data.  Last Pain:  Vitals:   01/02/24 1205  TempSrc:   PainSc: 4     Pt complains of pain in eyes   Patients Stated Pain Goal: 3 (01/02/24 1138)  Complications: No notable events documented.

## 2024-01-02 NOTE — Op Note (Signed)
 OPERATIVE NOTE  [name] [name] Date/Time of Admission: 01/02/2024  1:03 AM  CSN: [id number];FMW:[id number] Attending Provider: Md, Trauma, MD Room/Bed: MCPO/NONE DOB: [birth year] Age: 43 y.o.   Pre-Op Diagnosis: Jaw fracture  Post-Op Diagnosis: Jaw fracture  Procedure: Open reduction internal fixation left mandibular angle fracture - transcervical approach (CPT [phone]) Maxillomandibular fixation (CPT 78546)  Anesthesia: General  Surgeon(s): [name] KANDICE Coddington, MD  Staff: Circulator: Waddell Righter, RN Scrub Person: Jarvis Hashimoto Vendor Representative : Claudene Macadam  Implants: Implant Name Type Inv. Item Serial No. Manufacturer Lot No. LRB No. Used Action  PLATE LOCK STRT 4H 1/SCREW - [id number] Plate PLATE LOCK STRT 4H 1/SCREW  [employer]  Left 1 Implanted  PLATE 4H W/GAP 2.0 - [id number] Plate PLATE 4H W/GAP 2.0  [employer]  Left 1 Implanted  SCREW NON LOCK X-DR 2.0X5 - [id number] Screw SCREW NON LOCK X-DR 2.0X5  [employer]FIX[employer]  Left 4 Implanted  SCREW LOCKING X-DR 2.0X10 - [id number] Screw SCREW LOCKING X-DR 2.0X10  [employer]FIX[employer]  Left 3 Implanted  SCREW LOCKING X-DR 2.0X12 - [id number] Screw SCREW LOCKING X-DR 2.0X12  [employer] MI[employer]FIX[employer]  Left 1 Implanted    Specimens: * No specimens in log *  Complications: none  EBL: 100 ML  IVF: Per anesthesia ML  Condition: stable  Operative Findings:  Severely displaced left mandibular angle fracture repaired via transcervical approach - 1.65mm 4-hole superior tension band 4 monocortical screws  - 2.55mm 4-hole inferior border load bearing plate 4 transosteal screws  Description of Operation:  The patient was identified in the preoperative area and consent confirmed in the chart. He was brought to the operating room by the anesthetist and a preoperative huddle was performed confirming the patient identity and procedure to be performed.  Once all  were in agreement we proceeded with surgery.  General anesthesia induced and the patient was intubated with a nasotracheal tube secured with a head drape.  The patient was turned 180 degrees from anesthetist.  Patient's oral cavity was examined. The curvilinear left neck incision was designed below the mandible 2 finger-breadths and anesthetized with local anesthetic. The patient was prepped and draped in standard clean fashion for procedure of this kind.  Final preoperative pause was performed and we proceeded with surgery.  I began with maxillomandibular fixation. 4 Mini-tie maxillomandibular fixation bands were placed , two per side between appropriate interdental gaps spanning the maxillary and mandibular arches and cinched down into the patients normal occlusion based off pre-existing wear facets. MMF was removed after ORIF below.    Next I proceeded with open reduction internal fixation left mandibular angle fracture, trans-cervical approach.  The curvilinear incision below the left mandibular angle and body was incised with a 15 blade through the platysma layer.  Double-pronged skin hooks were applied.  Subplatysmal flap was elevated superiorly a couple of centimeters.  3-0 silk tie back sutures were applied to the flap.  Posteriorly the parotid fascia was incised and the parotid gland reflected superiorly.  Next the submandibular gland fascia was incised inferiorly and dissection and top of the submandibular gland was performed to avoid trauma to the marginal mandibular nerve.  A facial vein was ligated and reflected superiorly protecting the marginal mandibular nerve.  [employer] retractors were applied including the use of the robotic Walter arm.  The fracture site was encountered.  The fracture site was incised with monopolar cautery.  #9 periosteal elevator and Frazier suction were used to dissect along the anterior  mandibular body and posterior mandibular angle in a subperiosteal plane.  Masseter  muscle was incised and released off the posterior angle.  Once the fracture was fully exposed the fracture was debrided of blood clot and callus. Dingman bone clamps were applied to the body and angle to reduce the fracture into anatomic reduction.   ORIF was performed pre-drilling holes for the superior border plate, a 8.9ff 4 hole tension band and 4 monocortical screws applied. Next pre-drilling holes for the inferior border load bearing plate, a 7.9ff 4-hole plate with bicortical screws was then secured.  The MMF was removed. Excellent occlusion achieved based off wear facets.   The wounds were copiously irrigated and valsalva performed confirming hemostasis.  A 1/2 penrose drain was placed and secured with 3-0 nylon.   Layered closure was performed with buried interrupted 3-0 vicryl sutures for platysmal and deep dermal layer and running 5-0 nylon for skin. Bacitracin, 4x4gauze and band-net was applied.   The patient was turned back to the anesthetist to extubated her and brought her to the recovery room in stable condition.  All counts were correct and final.    Plan: - No chew diet x 6 weeks - Augmentin peridex  x 7 days - Drain out POD#1   [name] KANDICE Coddington, MD [facility]  01/02/2024

## 2024-01-02 NOTE — Plan of Care (Signed)
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
## 2024-01-02 NOTE — ED Notes (Signed)
 Patient transported to CT
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
## 2024-01-02 NOTE — ED Notes (Signed)
 ED Provider at bedside.
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
## 2024-01-02 NOTE — Anesthesia Postprocedure Evaluation (Signed)
 Anesthesia Post Note  Patient: [name] [name]  Procedure(s) Performed: OPEN REDUCTION INTERNAL FIXATION (ORIF) MANDIBULAR FRACTURE Maxillomadibular Fixation (Left)     Patient location during evaluation: PACU Anesthesia Type: General Level of consciousness: awake and alert Pain management: pain level controlled Vital Signs Assessment: post-procedure vital signs reviewed and stable Respiratory status: spontaneous breathing, nonlabored ventilation, respiratory function stable and patient connected to nasal cannula oxygen Cardiovascular status: blood pressure returned to baseline and stable Postop Assessment: no apparent nausea or vomiting Anesthetic complications: no   No notable events documented.  Last Vitals:  Vitals:   01/02/24 1530 01/02/24 1545  BP: (!) 150/107 135/86  Pulse: 86 72  Resp: 14 (!) 8  Temp:  36.9 C  SpO2: 97% 94%    Last Pain:  Vitals:   01/02/24 1545  TempSrc:   PainSc: Asleep                 [name]

## 2024-01-02 NOTE — Anesthesia Procedure Notes (Addendum)
 Procedure Name: Intubation Date/Time: 01/02/2024 12:35 PM  Performed by: Arvell Edsel HERO, CRNAPre-anesthesia Checklist: Patient identified, Emergency Drugs available, Suction available and Patient being monitored Patient Re-evaluated:Patient Re-evaluated prior to induction Oxygen Delivery Method: Circle System Utilized Preoxygenation: Pre-oxygenation with 100% oxygen Induction Type: IV induction Ventilation: Mask ventilation without difficulty Laryngoscope Size: Mac and 4 Grade View: Grade I Nasal Tubes: Nasal Rae Tube size: 7.0 mm Number of attempts: 1 Airway Equipment and Method: Stylet and Oral airway Placement Confirmation: ETT inserted through vocal cords under direct vision, positive ETCO2 and breath sounds checked- equal and bilateral Tube secured with: Tape Dental Injury: Teeth and Oropharynx as per pre-operative assessment
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
## 2024-01-02 NOTE — Anesthesia Preprocedure Evaluation (Addendum)
 Anesthesia Evaluation  Patient identified by MRN, date of birth, ID band Patient awake    Reviewed: Allergy & Precautions, NPO status , Patient's Chart, lab work & pertinent test results  Airway Mallampati: IV     Mouth opening: Limited Mouth Opening  Dental  (+) Teeth Intact, Poor Dentition, Missing, Loose   Pulmonary Current Smoker    + decreased breath sounds      Cardiovascular hypertension, Pt. on medications  Rhythm:Regular Rate:Normal     Neuro/Psych  PSYCHIATRIC DISORDERS       Neuromuscular disease CVA    GI/Hepatic negative GI ROS, Neg liver ROS,,,  Endo/Other  negative endocrine ROS    Renal/GU Renal disease     Musculoskeletal  (+) Arthritis ,    Abdominal   Peds  Hematology   Anesthesia Other Findings   Reproductive/Obstetrics                              Anesthesia Physical Anesthesia Plan  ASA: 2  Anesthesia Plan: General   Post-op Pain Management: Ofirmev  IV (intra-op)*   Induction: Intravenous  PONV Risk Score and Plan: 2 and Ondansetron  and Midazolam   Airway Management Planned: Nasal ETT and Video Laryngoscope Planned  Additional Equipment: None  Intra-op Plan:   Post-operative Plan: Extubation in OR  Informed Consent: I have reviewed the patients History and Physical, chart, labs and discussed the procedure including the risks, benefits and alternatives for the proposed anesthesia with the patient or authorized representative who has indicated his/her understanding and acceptance.     Dental advisory given  Plan Discussed with: CRNA  Anesthesia Plan Comments:          Anesthesia Quick Evaluation

## 2024-01-03 ENCOUNTER — Inpatient Hospital Stay (HOSPITAL_COMMUNITY)

## 2024-01-03 LAB — CBC
HCT: 41.6 % (ref 39.0–52.0)
Hemoglobin: 14.6 g/dL (ref 13.0–17.0)
MCH: 34 pg (ref 26.0–34.0)
MCHC: 35.1 g/dL (ref 30.0–36.0)
MCV: 97 fL (ref 80.0–100.0)
Platelets: 125 K/uL — ABNORMAL LOW (ref 150–400)
RBC: 4.29 MIL/uL (ref 4.22–5.81)
RDW: 15.5 % (ref 11.5–15.5)
WBC: 10.5 K/uL (ref 4.0–10.5)
nRBC: 0 % (ref 0.0–0.2)

## 2024-01-03 LAB — BASIC METABOLIC PANEL WITH GFR
Anion gap: 10 (ref 5–15)
BUN: 8 mg/dL (ref 6–20)
CO2: 24 mmol/L (ref 22–32)
Calcium: 7.8 mg/dL — ABNORMAL LOW (ref 8.9–10.3)
Chloride: 104 mmol/L (ref 98–111)
Creatinine, Ser: 0.87 mg/dL (ref 0.61–1.24)
GFR, Estimated: >60 mL/min (ref >60)
Glucose, Bld: 126 mg/dL — ABNORMAL HIGH (ref 70–99)
Potassium: 4.4 mmol/L (ref 3.5–5.1)
Sodium: 138 mmol/L (ref 135–145)

## 2024-01-03 MED ORDER — AMLODIPINE BESYLATE 5 MG PO TABS: 5.0000 mg | ORAL_TABLET | Freq: Every day | ORAL | Status: DC

## 2024-01-03 MED ORDER — CLINDAMYCIN HCL 150 MG PO CAPS
300.0000 mg | ORAL_CAPSULE | Freq: Three times a day (TID) | ORAL | Status: DC
  Administered 2024-01-03 – 2024-01-04 (×3): 300 mg via ORAL
  Filled 2024-01-03 (×5): qty 2

## 2024-01-03 MED ORDER — CHLORHEXIDINE GLUCONATE 0.12 % MT SOLN
15.0000 mL | Freq: Two times a day (BID) | OROMUCOSAL | Status: DC
  Administered 2024-01-03 – 2024-01-04 (×3): 15 mL via OROMUCOSAL
  Filled 2024-01-03 (×3): qty 15
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
## 2024-01-03 NOTE — TOC CAGE-AID Note (Signed)
 Transition of Care [facility]) - CAGE-AID Screening   Patient Details  Name: [name] [name] MRN: [id number] Date of Birth: Jul 07, 1980  Transition of Care [facility]) CM/SW Contact:    LEBRON ROCKIE ORN, RN Phone Number: [phone] 01/03/2024, 6:56 PM   Clinical Narrative: Pt denies excessive alcohol or drug use however he tested positive for both on admission. Pt is on CIWA and receiving ativan  prn.  Resources to be printed on AVS.    CAGE-AID Screening:    Have You Ever Felt You Ought to Cut Down on Your Drinking or Drug Use?: No Have People Annoyed You By Critizing Your Drinking Or Drug Use?: No Have You Felt Bad Or Guilty About Your Drinking Or Drug Use?: No Have You Ever Had a Drink or Used Drugs First Thing In The Morning to Steady Your Nerves or to Get Rid of a Hangover?: No CAGE-AID Score: 0  Substance Abuse Education Offered: (S) Yes (pt denies excessive alcohol use and states he only drinks and does drugs occasionally and denies WD but he was positive for alcohol and drugs on admission. On CIWA and resources to be printed on AVS)
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
## 2024-01-03 NOTE — Plan of Care (Signed)
  Problem: Clinical Measurements: Goal: Will remain free from infection Outcome: Progressing Goal: Respiratory complications will improve Outcome: Progressing   Problem: Activity: Goal: Risk for activity intolerance will decrease Outcome: Progressing   Problem: Nutrition: Goal: Adequate nutrition will be maintained Outcome: Progressing   Problem: Elimination: Goal: Will not experience complications related to bowel motility Outcome: Progressing Goal: Will not experience complications related to urinary retention Outcome: Progressing

## 2024-01-03 NOTE — Discharge Instructions (Signed)
 No chewing x 6 weeks Keep dressing on for 3 days Dr. Vella office will call for a postop visit this Friday for suture removal  Elspeth Coddington MD [facility] [phone] [address] [place], [place] [address] Phone: [phone]
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
## 2024-01-03 NOTE — Progress Notes (Signed)
..  Trauma Event Note    Reason for Call :  Repeat head CT completed.   Last imported Vital Signs BP (!) 146/96 (BP Location: Left Arm)   Pulse 77   Temp 98.7 F (37.1 C) (Oral)   Resp 12   Ht 5' 7.01 (1.702 m)   Wt 182 lb 15.7 oz (83 kg)   SpO2 98%   BMI 28.65 kg/m   Trending CBC Recent Labs    01/02/24 0124 01/02/24 0952 01/03/24 0208  WBC 12.0* 8.8 10.5  HGB 18.1* 16.8 14.6  HCT 50.6 47.8 41.6  PLT 159 137* 125*    Trending Coag's Recent Labs    01/02/24 0124  INR 1.0    Trending BMET Recent Labs    01/02/24 0124 01/02/24 0952 01/03/24 0208  NA 149* 140 138  K 3.8 4.1 4.4  CL 108 107 104  CO2 19* 18* 24  BUN 8 5* 8  CREATININE 0.92 0.78 0.87  GLUCOSE 109* 114* 126*      Yoali Conry Dee  Trauma Response RN  Please call TRN at [phone] for further assistance.

## 2024-01-04 ENCOUNTER — Other Ambulatory Visit (HOSPITAL_COMMUNITY): Payer: Self-pay

## 2024-01-04 MED ORDER — OXYCODONE HCL 5 MG/5ML PO SOLN: 5.0000 mg | ORAL | 0 refills | Status: DC | PRN

## 2024-01-04 MED ORDER — ACETAMINOPHEN 500 MG PO TABS: 1000.0000 mg | ORAL_TABLET | Freq: Four times a day (QID) | ORAL | Status: DC | PRN

## 2024-01-04 MED ORDER — METHOCARBAMOL 500 MG PO TABS
500.0000 mg | ORAL_TABLET | Freq: Three times a day (TID) | ORAL | 0 refills | Status: DC | PRN
  Filled 2024-01-04: qty 40, 14d supply, fill #0

## 2024-01-04 MED ORDER — OXYCODONE HCL 5 MG/5ML PO SOLN
5.0000 mg | ORAL | 0 refills | Status: DC | PRN
  Filled 2024-01-04: qty 300, 5d supply, fill #0

## 2024-01-04 MED ORDER — LEVETIRACETAM 500 MG PO TABS
500.0000 mg | ORAL_TABLET | Freq: Two times a day (BID) | ORAL | 0 refills | Status: DC
Start: 2024-01-04 — End: 2024-01-21
  Filled 2024-01-04: qty 10, 5d supply, fill #0

## 2024-01-04 MED ORDER — CLINDAMYCIN HCL 300 MG PO CAPS
300.0000 mg | ORAL_CAPSULE | Freq: Three times a day (TID) | ORAL | 0 refills | Status: AC
  Filled 2024-01-04: qty 18, 6d supply, fill #0

## 2024-01-04 NOTE — Progress Notes (Signed)
 Speech therapy here doing testing prior to DC.
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
## 2024-01-04 NOTE — Progress Notes (Signed)
 Discharging pt - emphasized to pt to make PCP appt this afternoon. Ride in room with pt - she will be taking him home. All meds given to pt prior to DC.
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
## 2024-01-04 NOTE — Plan of Care (Signed)
  Problem: Clinical Measurements: Goal: Will remain free from infection Outcome: Progressing   Problem: Pain Managment: Goal: General experience of comfort will improve and/or be controlled Outcome: Progressing   Problem: Safety: Goal: Ability to remain free from injury will improve Outcome: Progressing   Problem: Skin Integrity: Goal: Risk for impaired skin integrity will decrease Outcome: Progressing
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
## 2024-01-05 ENCOUNTER — Encounter (HOSPITAL_COMMUNITY): Payer: Self-pay | Admitting: Otolaryngology

## 2024-01-05 NOTE — Progress Notes (Signed)
 Creatinine was normal on admission.

## 2024-01-17 ENCOUNTER — Other Ambulatory Visit: Payer: Self-pay

## 2024-01-17 ENCOUNTER — Encounter (HOSPITAL_COMMUNITY): Payer: Self-pay | Admitting: *Deleted

## 2024-01-17 ENCOUNTER — Emergency Department (HOSPITAL_COMMUNITY)

## 2024-01-17 ENCOUNTER — Observation Stay (HOSPITAL_COMMUNITY)
Admission: EM | Admit: 2024-01-17 | Discharge: 2024-01-18 | Disposition: A | Discharge status: Home / Self Care | Attending: Emergency Medicine | Admitting: Emergency Medicine

## 2024-01-17 DIAGNOSIS — I10 Essential (primary) hypertension: Secondary | ICD-10-CM | POA: Insufficient documentation

## 2024-01-17 DIAGNOSIS — Z7982 Long term (current) use of aspirin: Secondary | ICD-10-CM | POA: Diagnosis not present

## 2024-01-17 DIAGNOSIS — Z8659 Personal history of other mental and behavioral disorders: Secondary | ICD-10-CM | POA: Insufficient documentation

## 2024-01-17 DIAGNOSIS — R079 Chest pain, unspecified: Secondary | ICD-10-CM | POA: Diagnosis present

## 2024-01-17 DIAGNOSIS — F1910 Other psychoactive substance abuse, uncomplicated: Secondary | ICD-10-CM | POA: Insufficient documentation

## 2024-01-17 DIAGNOSIS — S065XAA Traumatic subdural hemorrhage with loss of consciousness status unknown, initial encounter: Secondary | ICD-10-CM | POA: Diagnosis not present

## 2024-01-17 DIAGNOSIS — Z8673 Personal history of transient ischemic attack (TIA), and cerebral infarction without residual deficits: Secondary | ICD-10-CM | POA: Diagnosis not present

## 2024-01-17 DIAGNOSIS — E876 Hypokalemia: Secondary | ICD-10-CM | POA: Diagnosis not present

## 2024-01-17 DIAGNOSIS — S02652A Fracture of angle of left mandible, initial encounter for closed fracture: Secondary | ICD-10-CM | POA: Diagnosis not present

## 2024-01-17 DIAGNOSIS — Z7901 Long term (current) use of anticoagulants: Secondary | ICD-10-CM | POA: Insufficient documentation

## 2024-01-17 DIAGNOSIS — I2699 Other pulmonary embolism without acute cor pulmonale: Principal | ICD-10-CM | POA: Insufficient documentation

## 2024-01-17 DIAGNOSIS — Z79899 Other long term (current) drug therapy: Secondary | ICD-10-CM | POA: Diagnosis not present

## 2024-01-17 DIAGNOSIS — F1090 Alcohol use, unspecified, uncomplicated: Secondary | ICD-10-CM | POA: Diagnosis not present

## 2024-01-17 DIAGNOSIS — S02609A Fracture of mandible, unspecified, initial encounter for closed fracture: Secondary | ICD-10-CM | POA: Diagnosis present

## 2024-01-17 DIAGNOSIS — I639 Cerebral infarction, unspecified: Secondary | ICD-10-CM | POA: Diagnosis present

## 2024-01-17 LAB — MAGNESIUM: Magnesium: 1.6 mg/dL — ABNORMAL LOW (ref 1.7–2.4)

## 2024-01-17 LAB — URINALYSIS, ROUTINE W REFLEX MICROSCOPIC
Bilirubin Urine: NEGATIVE
Glucose, UA: NEGATIVE mg/dL
Hgb urine dipstick: NEGATIVE
Ketones, ur: 5 mg/dL — AB
Leukocytes,Ua: NEGATIVE
Nitrite: NEGATIVE
Protein, ur: NEGATIVE mg/dL
Specific Gravity, Urine: 1.001 — ABNORMAL LOW (ref 1.005–1.030)
pH: 7 (ref 5.0–8.0)

## 2024-01-17 LAB — CBC WITH DIFFERENTIAL/PLATELET
Abs Immature Granulocytes: 0.04 K/uL (ref 0.00–0.07)
Basophils Absolute: 0 K/uL (ref 0.0–0.1)
Basophils Relative: 1 %
Eosinophils Absolute: 0.1 K/uL (ref 0.0–0.5)
Eosinophils Relative: 2 %
HCT: 40.6 % (ref 39.0–52.0)
Hemoglobin: 14.2 g/dL (ref 13.0–17.0)
Immature Granulocytes: 1 %
Lymphocytes Relative: 11 %
Lymphs Abs: 0.5 K/uL — ABNORMAL LOW (ref 0.7–4.0)
MCH: 34.6 pg — ABNORMAL HIGH (ref 26.0–34.0)
MCHC: 35 g/dL (ref 30.0–36.0)
MCV: 99 fL (ref 80.0–100.0)
Monocytes Absolute: 0.7 K/uL (ref 0.1–1.0)
Monocytes Relative: 14 %
Neutro Abs: 3.5 K/uL (ref 1.7–7.7)
Neutrophils Relative %: 71 %
Platelets: 171 K/uL (ref 150–400)
RBC: 4.1 MIL/uL — ABNORMAL LOW (ref 4.22–5.81)
RDW: 14.8 % (ref 11.5–15.5)
WBC: 4.8 K/uL (ref 4.0–10.5)
nRBC: 0 % (ref 0.0–0.2)

## 2024-01-17 LAB — TROPONIN T, HIGH SENSITIVITY
Troponin T High Sensitivity: 23 ng/L — ABNORMAL HIGH (ref 0–19)
Troponin T High Sensitivity: 21 ng/L — ABNORMAL HIGH (ref 0–19)

## 2024-01-17 LAB — COMPREHENSIVE METABOLIC PANEL WITH GFR
ALT: 18 U/L (ref 0–44)
AST: 30 U/L (ref 15–41)
Albumin: 3.9 g/dL (ref 3.5–5.0)
Alkaline Phosphatase: 89 U/L (ref 38–126)
Anion gap: 13 (ref 5–15)
BUN: <5 mg/dL — ABNORMAL LOW (ref 6–20)
CO2: 24 mmol/L (ref 22–32)
Calcium: 8.2 mg/dL — ABNORMAL LOW (ref 8.9–10.3)
Chloride: 101 mmol/L (ref 98–111)
Creatinine, Ser: 0.85 mg/dL (ref 0.61–1.24)
GFR, Estimated: >60 mL/min (ref >60)
Glucose, Bld: 91 mg/dL (ref 70–99)
Potassium: 3.2 mmol/L — ABNORMAL LOW (ref 3.5–5.1)
Sodium: 137 mmol/L (ref 135–145)
Total Bilirubin: 1.1 mg/dL (ref 0.0–1.2)
Total Protein: 6.4 g/dL — ABNORMAL LOW (ref 6.5–8.1)

## 2024-01-17 LAB — PROTIME-INR
INR: 1 (ref 0.8–1.2)
Prothrombin Time: 13.9 s (ref 11.4–15.2)

## 2024-01-17 LAB — APTT: aPTT: 31 s (ref 24–36)

## 2024-01-17 MED ORDER — THIAMINE HCL 100 MG/ML IJ SOLN: 100.0000 mg | Freq: Every day | INTRAMUSCULAR | Status: DC

## 2024-01-17 MED ORDER — AMLODIPINE BESYLATE 5 MG PO TABS
5.0000 mg | ORAL_TABLET | Freq: Every day | ORAL | Status: DC
  Administered 2024-01-17: 5 mg via ORAL
  Filled 2024-01-17: qty 1

## 2024-01-17 MED ORDER — ONDANSETRON HCL 4 MG/2ML IJ SOLN
4.0000 mg | Freq: Once | INTRAMUSCULAR | Status: AC
  Administered 2024-01-17: 4 mg via INTRAVENOUS
  Filled 2024-01-17: qty 2

## 2024-01-17 MED ORDER — ADULT MULTIVITAMIN W/MINERALS CH
1.0000 | ORAL_TABLET | Freq: Every day | ORAL | Status: DC
  Administered 2024-01-17: 1 via ORAL
  Filled 2024-01-17: qty 1

## 2024-01-17 MED ORDER — PROCHLORPERAZINE EDISYLATE 10 MG/2ML IJ SOLN
10.0000 mg | Freq: Once | INTRAMUSCULAR | Status: AC
  Administered 2024-01-17: 10 mg via INTRAVENOUS
  Filled 2024-01-17: qty 2

## 2024-01-17 MED ORDER — LORAZEPAM 1 MG PO TABS
1.0000 mg | ORAL_TABLET | ORAL | Status: DC | PRN
  Administered 2024-01-18: 1 mg via ORAL
  Filled 2024-01-17: qty 1

## 2024-01-17 MED ORDER — HYDROMORPHONE HCL 1 MG/ML IJ SOLN
1.0000 mg | Freq: Once | INTRAMUSCULAR | Status: AC
  Administered 2024-01-17: 1 mg via INTRAVENOUS
  Filled 2024-01-17: qty 1

## 2024-01-17 MED ORDER — ONDANSETRON HCL 4 MG PO TABS
4.0000 mg | ORAL_TABLET | Freq: Four times a day (QID) | ORAL | Status: DC | PRN
  Administered 2024-01-18: 4 mg via ORAL
  Filled 2024-01-17: qty 1

## 2024-01-17 MED ORDER — SODIUM CHLORIDE 0.9 % IV BOLUS
1000.0000 mL | Freq: Once | INTRAVENOUS | Status: AC
  Administered 2024-01-17: 1000 mL via INTRAVENOUS

## 2024-01-17 MED ORDER — LORAZEPAM 0.5 MG PO TABS
0.5000 mg | ORAL_TABLET | Freq: Once | ORAL | Status: AC
  Administered 2024-01-17: 0.5 mg via ORAL
  Filled 2024-01-17: qty 1

## 2024-01-17 MED ORDER — POLYETHYLENE GLYCOL 3350 17 G PO PACK: 17.0000 g | PACK | Freq: Every day | ORAL | Status: DC | PRN

## 2024-01-17 MED ORDER — MORPHINE SULFATE (PF) 4 MG/ML IV SOLN
4.0000 mg | Freq: Once | INTRAVENOUS | Status: AC
  Administered 2024-01-17: 4 mg via INTRAVENOUS
  Filled 2024-01-17: qty 1

## 2024-01-17 MED ORDER — IOHEXOL 350 MG/ML SOLN
75.0000 mL | Freq: Once | INTRAVENOUS | Status: AC | PRN
Start: 2024-01-17 — End: 2024-01-17
  Administered 2024-01-17: 75 mL via INTRAVENOUS

## 2024-01-17 MED ORDER — MAGNESIUM SULFATE 2 GM/50ML IV SOLN
2.0000 g | Freq: Once | INTRAVENOUS | Status: AC
  Administered 2024-01-17: 2 g via INTRAVENOUS
  Filled 2024-01-17: qty 50

## 2024-01-17 MED ORDER — OXYCODONE HCL 5 MG/5ML PO SOLN: 5.0000 mg | ORAL | Status: DC | PRN

## 2024-01-17 MED ORDER — THIAMINE MONONITRATE 100 MG PO TABS
100.0000 mg | ORAL_TABLET | Freq: Every day | ORAL | Status: DC
  Administered 2024-01-17: 100 mg via ORAL
  Filled 2024-01-17: qty 1

## 2024-01-17 MED ORDER — LORAZEPAM 2 MG/ML IJ SOLN: 1.0000 mg | INTRAMUSCULAR | Status: DC | PRN

## 2024-01-17 MED ORDER — OXYCODONE HCL 5 MG PO TABS
5.0000 mg | ORAL_TABLET | Freq: Four times a day (QID) | ORAL | Status: DC | PRN
  Administered 2024-01-17: 5 mg via ORAL
  Administered 2024-01-18: 10 mg via ORAL
  Filled 2024-01-17 (×2): qty 2

## 2024-01-17 MED ORDER — POTASSIUM CHLORIDE CRYS ER 20 MEQ PO TBCR
40.0000 meq | EXTENDED_RELEASE_TABLET | Freq: Once | ORAL | Status: AC
  Administered 2024-01-17: 40 meq via ORAL
  Filled 2024-01-17: qty 2

## 2024-01-17 MED ORDER — DIPHENHYDRAMINE HCL 50 MG/ML IJ SOLN
25.0000 mg | Freq: Once | INTRAMUSCULAR | Status: AC
  Administered 2024-01-17: 25 mg via INTRAVENOUS
  Filled 2024-01-17: qty 1

## 2024-01-17 MED ORDER — POTASSIUM CHLORIDE 20 MEQ PO PACK
40.0000 meq | PACK | Freq: Once | ORAL | Status: AC
  Administered 2024-01-17: 40 meq via ORAL
  Filled 2024-01-17: qty 2

## 2024-01-17 MED ORDER — OXYCODONE-ACETAMINOPHEN 5-325 MG PO TABS
2.0000 | ORAL_TABLET | Freq: Once | ORAL | Status: AC
  Administered 2024-01-17: 2 via ORAL
  Filled 2024-01-17: qty 2

## 2024-01-17 MED ORDER — ACETAMINOPHEN 325 MG PO TABS
650.0000 mg | ORAL_TABLET | Freq: Four times a day (QID) | ORAL | Status: DC | PRN
  Administered 2024-01-18: 650 mg via ORAL
  Filled 2024-01-17: qty 2

## 2024-01-17 MED ORDER — FOLIC ACID 1 MG PO TABS
1.0000 mg | ORAL_TABLET | Freq: Every day | ORAL | Status: DC
  Administered 2024-01-17: 1 mg via ORAL
  Filled 2024-01-17: qty 1

## 2024-01-17 MED ORDER — ACETAMINOPHEN 650 MG RE SUPP: 650.0000 mg | Freq: Four times a day (QID) | RECTAL | Status: DC | PRN

## 2024-01-17 MED ORDER — ONDANSETRON HCL 4 MG/2ML IJ SOLN: 4.0000 mg | Freq: Four times a day (QID) | INTRAMUSCULAR | Status: DC | PRN

## 2024-01-17 MED ORDER — HEPARIN (PORCINE) 25000 UT/250ML-% IV SOLN
1650.0000 [IU]/h | INTRAVENOUS | Status: DC
  Administered 2024-01-17: 1400 [IU]/h via INTRAVENOUS
  Filled 2024-01-17: qty 250

## 2024-01-17 NOTE — ED Triage Notes (Signed)
 BIB [facility] from home for L sided CP, as well as NVD. Describes as burning. Onset yesterday. Endorses developing migraine HA. VSS. EKG unremarkable, NSR. VS: HR 66, BP 133/80, RR 20, SPO2 100% RA. CBG 111. H/o CVA, poly-SUDs, and HTN. Recent jaw surgery 1 week ago. Alert, NAD, calm, interactive, resps e/u, speaking in clear complete sentences. Restless in w/c, arm cramp with BP cuff. NSR in triage.
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
## 2024-01-18 ENCOUNTER — Encounter (HOSPITAL_COMMUNITY): Payer: Self-pay | Admitting: Internal Medicine

## 2024-01-18 ENCOUNTER — Inpatient Hospital Stay (HOSPITAL_COMMUNITY): Admission: RE | Admit: 2024-01-18 | Source: Ambulatory Visit

## 2024-01-18 LAB — BASIC METABOLIC PANEL WITH GFR
Anion gap: 11 (ref 5–15)
BUN: <5 mg/dL — ABNORMAL LOW (ref 6–20)
CO2: 22 mmol/L (ref 22–32)
Calcium: 8 mg/dL — ABNORMAL LOW (ref 8.9–10.3)
Chloride: 104 mmol/L (ref 98–111)
Creatinine, Ser: 0.79 mg/dL (ref 0.61–1.24)
GFR, Estimated: >60 mL/min (ref >60)
Glucose, Bld: 88 mg/dL (ref 70–99)
Potassium: 3.9 mmol/L (ref 3.5–5.1)
Sodium: 137 mmol/L (ref 135–145)

## 2024-01-18 LAB — CBC
HCT: 40.6 % (ref 39.0–52.0)
Hemoglobin: 13.9 g/dL (ref 13.0–17.0)
MCH: 34.5 pg — ABNORMAL HIGH (ref 26.0–34.0)
MCHC: 34.2 g/dL (ref 30.0–36.0)
MCV: 100.7 fL — ABNORMAL HIGH (ref 80.0–100.0)
Platelets: 168 K/uL (ref 150–400)
RBC: 4.03 MIL/uL — ABNORMAL LOW (ref 4.22–5.81)
RDW: 14.9 % (ref 11.5–15.5)
WBC: 5 K/uL (ref 4.0–10.5)
nRBC: 0 % (ref 0.0–0.2)

## 2024-01-18 LAB — HEPARIN LEVEL (UNFRACTIONATED): Heparin Unfractionated: 0.1 [IU]/mL — ABNORMAL LOW (ref 0.30–0.70)

## 2024-01-18 LAB — MAGNESIUM: Magnesium: 2.2 mg/dL (ref 1.7–2.4)

## 2024-01-18 MED ORDER — OXYCODONE HCL 5 MG PO TABS
5.0000 mg | ORAL_TABLET | Freq: Once | ORAL | Status: AC
  Administered 2024-01-18: 5 mg via ORAL
  Filled 2024-01-18: qty 1

## 2024-01-18 MED ORDER — NICOTINE 21 MG/24HR TD PT24
21.0000 mg | MEDICATED_PATCH | Freq: Once | TRANSDERMAL | Status: DC
  Administered 2024-01-18: 21 mg via TRANSDERMAL
  Filled 2024-01-18: qty 1

## 2024-01-18 NOTE — ED Notes (Addendum)
 Pt left room and went to the ED registration up front awaiting for his ride per registration. Pt unhooked his IV from heparin  drip and took his IV out. Pt stated that he wanted to leave AMA. This RN discussed major risks of leaving AMA and pt verbalized understanding. Pt states I can't wait any longer I am sorry but I want to go AMA form signed and MD made aware. Pt also stated that if yall send any medication to a pharmacy send it to [facility] in [place] because [facility] has a lock on my medication

## 2024-01-18 NOTE — ED Notes (Addendum)
 Pt appears restless at this time, pt keeps taking off cardiac monitoring leads and stands up to walk around, informed pt of need to keep cardiac monitoring leads on as well as SpO2 monitor

## 2024-01-18 NOTE — ED Notes (Signed)
 Pt requesting to speak with MD about being discharged because his mother had a fall and he wants to be with her. Dr Manfred notified and to bedside to speak with pt.

## 2024-01-18 NOTE — ED Notes (Signed)
 Pt unplugged himself from cardiac and Spo2 monitor again, placed back and pt and re-informed pt of importance of keeping those monitoring devices on

## 2024-01-18 NOTE — Progress Notes (Signed)
 PHARMACY - ANTICOAGULATION  Pharmacy Consult for heparin  Indication: pulmonary embolus Brief A/P: Heparin  level subtherapeutic Increase Heparin  rate  Allergies  Allergen Reactions   Amoxicillin Swelling and Rash   Penicillins Swelling and Rash    Per the patient never took penicillins    Patient Measurements: Height: 5' 7 (170.2 cm) Weight: 82.6 kg (182 lb) IBW/kg (Calculated) : 66.1 HEPARIN  DW (KG): 82.6  Vital Signs: Temp: 98.5 F (36.9 C) (10/20 0042) Temp Source: Oral (10/20 0042) BP: 129/86 (10/19 2257) Pulse Rate: 63 (10/19 2257)  Labs: Recent Labs    01/17/24 1207 01/17/24 1646 01/18/24 0021  HGB 14.2  --  13.9  HCT 40.6  --  40.6  PLT 171  --  168  APTT  --  31  --   LABPROT  --  13.9  --   INR  --  1.0  --   HEPARINUNFRC  --   --  0.10*  CREATININE 0.85  --   --     Estimated Creatinine Clearance: 115.2 mL/min (by C-G formula based on SCr of 0.85 mg/dL).   Assessment: 43 y.o. male with PE for heparin   Goal of Therapy:  No Boluses Heparin  level 0.3-0.7 units/ml Monitor platelets by anticoagulation protocol: Yes   Plan:  Increase Heparin  1650 units/hr Check heparin  level in 8 hours.   Cathlyn Arrant, PharmD, BCPS  01/18/2024 1:05 AM

## 2024-01-19 ENCOUNTER — Encounter (HOSPITAL_COMMUNITY): Payer: Self-pay

## 2024-01-19 ENCOUNTER — Emergency Department (HOSPITAL_COMMUNITY)

## 2024-01-19 ENCOUNTER — Inpatient Hospital Stay (HOSPITAL_COMMUNITY)
Admission: EM | Admit: 2024-01-19 | Discharge: 2024-01-21 | DRG: 175 | Disposition: A | Discharge status: Home / Self Care | Attending: Internal Medicine | Admitting: Internal Medicine

## 2024-01-19 ENCOUNTER — Other Ambulatory Visit: Payer: Self-pay

## 2024-01-19 DIAGNOSIS — Z880 Allergy status to penicillin: Secondary | ICD-10-CM

## 2024-01-19 DIAGNOSIS — I2693 Single subsegmental pulmonary embolism without acute cor pulmonale: Principal | ICD-10-CM | POA: Diagnosis present

## 2024-01-19 DIAGNOSIS — S02609A Fracture of mandible, unspecified, initial encounter for closed fracture: Secondary | ICD-10-CM | POA: Diagnosis present

## 2024-01-19 DIAGNOSIS — Z7982 Long term (current) use of aspirin: Secondary | ICD-10-CM

## 2024-01-19 DIAGNOSIS — Z8249 Family history of ischemic heart disease and other diseases of the circulatory system: Secondary | ICD-10-CM

## 2024-01-19 DIAGNOSIS — Y906 Blood alcohol level of 120-199 mg/100 ml: Secondary | ICD-10-CM | POA: Diagnosis present

## 2024-01-19 DIAGNOSIS — Z8673 Personal history of transient ischemic attack (TIA), and cerebral infarction without residual deficits: Secondary | ICD-10-CM

## 2024-01-19 DIAGNOSIS — F17210 Nicotine dependence, cigarettes, uncomplicated: Secondary | ICD-10-CM | POA: Diagnosis present

## 2024-01-19 DIAGNOSIS — Z79899 Other long term (current) drug therapy: Secondary | ICD-10-CM

## 2024-01-19 DIAGNOSIS — I10 Essential (primary) hypertension: Secondary | ICD-10-CM | POA: Diagnosis present

## 2024-01-19 DIAGNOSIS — S02652A Fracture of angle of left mandible, initial encounter for closed fracture: Secondary | ICD-10-CM | POA: Diagnosis present

## 2024-01-19 DIAGNOSIS — I2699 Other pulmonary embolism without acute cor pulmonale: Principal | ICD-10-CM | POA: Diagnosis present

## 2024-01-19 DIAGNOSIS — F1910 Other psychoactive substance abuse, uncomplicated: Secondary | ICD-10-CM | POA: Diagnosis present

## 2024-01-19 DIAGNOSIS — Z5982 Transportation insecurity: Secondary | ICD-10-CM

## 2024-01-19 DIAGNOSIS — S065XAA Traumatic subdural hemorrhage with loss of consciousness status unknown, initial encounter: Secondary | ICD-10-CM | POA: Diagnosis present

## 2024-01-19 DIAGNOSIS — F1010 Alcohol abuse, uncomplicated: Secondary | ICD-10-CM | POA: Diagnosis present

## 2024-01-19 DIAGNOSIS — G629 Polyneuropathy, unspecified: Secondary | ICD-10-CM | POA: Diagnosis present

## 2024-01-19 DIAGNOSIS — R7989 Other specified abnormal findings of blood chemistry: Secondary | ICD-10-CM | POA: Insufficient documentation

## 2024-01-19 DIAGNOSIS — S0232XA Fracture of orbital floor, left side, initial encounter for closed fracture: Secondary | ICD-10-CM | POA: Diagnosis present

## 2024-01-19 LAB — CBC WITH DIFFERENTIAL/PLATELET
Abs Immature Granulocytes: 0.04 K/uL (ref 0.00–0.07)
Basophils Absolute: 0 K/uL (ref 0.0–0.1)
Basophils Relative: 1 %
Eosinophils Absolute: 0.1 K/uL (ref 0.0–0.5)
Eosinophils Relative: 2 %
HCT: 43.6 % (ref 39.0–52.0)
Hemoglobin: 15.5 g/dL (ref 13.0–17.0)
Immature Granulocytes: 1 %
Lymphocytes Relative: 20 %
Lymphs Abs: 1.3 K/uL (ref 0.7–4.0)
MCH: 34.8 pg — ABNORMAL HIGH (ref 26.0–34.0)
MCHC: 35.6 g/dL (ref 30.0–36.0)
MCV: 97.8 fL (ref 80.0–100.0)
Monocytes Absolute: 0.6 K/uL (ref 0.1–1.0)
Monocytes Relative: 10 %
Neutro Abs: 4.4 K/uL (ref 1.7–7.7)
Neutrophils Relative %: 66 %
Platelets: 216 K/uL (ref 150–400)
RBC: 4.46 MIL/uL (ref 4.22–5.81)
RDW: 14.8 % (ref 11.5–15.5)
WBC: 6.5 K/uL (ref 4.0–10.5)
nRBC: 0 % (ref 0.0–0.2)

## 2024-01-19 LAB — PROTIME-INR
INR: 1 (ref 0.8–1.2)
Prothrombin Time: 13.3 s (ref 11.4–15.2)

## 2024-01-19 LAB — BASIC METABOLIC PANEL WITH GFR
Anion gap: 17 — ABNORMAL HIGH (ref 5–15)
BUN: <5 mg/dL — ABNORMAL LOW (ref 6–20)
CO2: 22 mmol/L (ref 22–32)
Calcium: 8.2 mg/dL — ABNORMAL LOW (ref 8.9–10.3)
Chloride: 104 mmol/L (ref 98–111)
Creatinine, Ser: 0.76 mg/dL (ref 0.61–1.24)
GFR, Estimated: >60 mL/min (ref >60)
Glucose, Bld: 82 mg/dL (ref 70–99)
Potassium: 3.8 mmol/L (ref 3.5–5.1)
Sodium: 142 mmol/L (ref 135–145)

## 2024-01-19 LAB — PRO BRAIN NATRIURETIC PEPTIDE: Pro Brain Natriuretic Peptide: 123 pg/mL (ref <300.0)

## 2024-01-19 LAB — TROPONIN T, HIGH SENSITIVITY: Troponin T High Sensitivity: 22 ng/L — ABNORMAL HIGH (ref 0–19)

## 2024-01-19 LAB — MAGNESIUM: Magnesium: 2.1 mg/dL (ref 1.7–2.4)

## 2024-01-19 MED ORDER — SODIUM CHLORIDE 0.9 % IV SOLN: INTRAVENOUS | Status: AC

## 2024-01-19 MED ORDER — HYDROMORPHONE HCL 1 MG/ML IJ SOLN
1.0000 mg | Freq: Once | INTRAMUSCULAR | Status: AC
  Administered 2024-01-19: 1 mg via INTRAVENOUS
  Filled 2024-01-19: qty 1

## 2024-01-19 MED ORDER — HEPARIN (PORCINE) 25000 UT/250ML-% IV SOLN
1700.0000 [IU]/h | INTRAVENOUS | Status: DC
  Administered 2024-01-20: 1600 [IU]/h via INTRAVENOUS
  Administered 2024-01-20: 1700 [IU]/h via INTRAVENOUS
  Filled 2024-01-19 (×2): qty 250

## 2024-01-19 NOTE — ED Triage Notes (Signed)
 Pt c/o chest pain and SOB, pt was seen here other day and diagnosed with blood clots in lungs but pt left hospital. Pt back stating pain and sob are worse and now having productive cough.

## 2024-01-19 NOTE — ED Notes (Signed)
 Patient transported to CT

## 2024-01-19 NOTE — ED Notes (Signed)
 ED Provider at bedside.
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
## 2024-01-20 ENCOUNTER — Observation Stay (HOSPITAL_COMMUNITY)

## 2024-01-20 ENCOUNTER — Other Ambulatory Visit (HOSPITAL_COMMUNITY): Payer: Self-pay | Admitting: *Deleted

## 2024-01-20 DIAGNOSIS — I2609 Other pulmonary embolism with acute cor pulmonale: Secondary | ICD-10-CM | POA: Diagnosis not present

## 2024-01-20 DIAGNOSIS — Z79899 Other long term (current) drug therapy: Secondary | ICD-10-CM | POA: Diagnosis not present

## 2024-01-20 DIAGNOSIS — Z5982 Transportation insecurity: Secondary | ICD-10-CM | POA: Diagnosis not present

## 2024-01-20 DIAGNOSIS — Z8673 Personal history of transient ischemic attack (TIA), and cerebral infarction without residual deficits: Secondary | ICD-10-CM | POA: Diagnosis not present

## 2024-01-20 DIAGNOSIS — I2699 Other pulmonary embolism without acute cor pulmonale: Secondary | ICD-10-CM | POA: Diagnosis present

## 2024-01-20 DIAGNOSIS — F1910 Other psychoactive substance abuse, uncomplicated: Secondary | ICD-10-CM | POA: Diagnosis present

## 2024-01-20 DIAGNOSIS — S0232XA Fracture of orbital floor, left side, initial encounter for closed fracture: Secondary | ICD-10-CM | POA: Diagnosis present

## 2024-01-20 DIAGNOSIS — I10 Essential (primary) hypertension: Secondary | ICD-10-CM | POA: Diagnosis present

## 2024-01-20 DIAGNOSIS — R7989 Other specified abnormal findings of blood chemistry: Secondary | ICD-10-CM | POA: Diagnosis not present

## 2024-01-20 DIAGNOSIS — F17210 Nicotine dependence, cigarettes, uncomplicated: Secondary | ICD-10-CM | POA: Diagnosis present

## 2024-01-20 DIAGNOSIS — G629 Polyneuropathy, unspecified: Secondary | ICD-10-CM | POA: Diagnosis present

## 2024-01-20 DIAGNOSIS — S02652A Fracture of angle of left mandible, initial encounter for closed fracture: Secondary | ICD-10-CM | POA: Diagnosis present

## 2024-01-20 DIAGNOSIS — Z8249 Family history of ischemic heart disease and other diseases of the circulatory system: Secondary | ICD-10-CM | POA: Diagnosis not present

## 2024-01-20 DIAGNOSIS — F1010 Alcohol abuse, uncomplicated: Secondary | ICD-10-CM | POA: Diagnosis present

## 2024-01-20 DIAGNOSIS — I2693 Single subsegmental pulmonary embolism without acute cor pulmonale: Secondary | ICD-10-CM | POA: Diagnosis present

## 2024-01-20 DIAGNOSIS — S02609D Fracture of mandible, unspecified, subsequent encounter for fracture with routine healing: Secondary | ICD-10-CM | POA: Diagnosis not present

## 2024-01-20 DIAGNOSIS — Z7982 Long term (current) use of aspirin: Secondary | ICD-10-CM | POA: Diagnosis not present

## 2024-01-20 DIAGNOSIS — Y906 Blood alcohol level of 120-199 mg/100 ml: Secondary | ICD-10-CM | POA: Diagnosis present

## 2024-01-20 DIAGNOSIS — S065XAA Traumatic subdural hemorrhage with loss of consciousness status unknown, initial encounter: Secondary | ICD-10-CM | POA: Diagnosis present

## 2024-01-20 DIAGNOSIS — Z880 Allergy status to penicillin: Secondary | ICD-10-CM | POA: Diagnosis not present

## 2024-01-20 LAB — CBC
HCT: 43.3 % (ref 39.0–52.0)
Hemoglobin: 15.5 g/dL (ref 13.0–17.0)
MCH: 34.1 pg — ABNORMAL HIGH (ref 26.0–34.0)
MCHC: 35.8 g/dL (ref 30.0–36.0)
MCV: 95.4 fL (ref 80.0–100.0)
Platelets: 189 K/uL (ref 150–400)
RBC: 4.54 MIL/uL (ref 4.22–5.81)
RDW: 14.1 % (ref 11.5–15.5)
WBC: 6.6 K/uL (ref 4.0–10.5)
nRBC: 0 % (ref 0.0–0.2)

## 2024-01-20 LAB — HEPARIN LEVEL (UNFRACTIONATED)
Heparin Unfractionated: 0.26 [IU]/mL — ABNORMAL LOW (ref 0.30–0.70)
Heparin Unfractionated: 0.33 [IU]/mL (ref 0.30–0.70)

## 2024-01-20 LAB — TROPONIN T, HIGH SENSITIVITY
Troponin T High Sensitivity: 26 ng/L — ABNORMAL HIGH (ref 0–19)
Troponin T High Sensitivity: 19 ng/L (ref 0–19)
Troponin T High Sensitivity: 17 ng/L (ref 0–19)

## 2024-01-20 LAB — ETHANOL: Alcohol, Ethyl (B): 161 mg/dL — ABNORMAL HIGH (ref <15)

## 2024-01-20 MED ORDER — ACETAMINOPHEN 650 MG RE SUPP: 650.0000 mg | Freq: Four times a day (QID) | RECTAL | Status: DC | PRN

## 2024-01-20 MED ORDER — KETOROLAC TROMETHAMINE 15 MG/ML IJ SOLN
15.0000 mg | Freq: Once | INTRAMUSCULAR | Status: AC
  Administered 2024-01-20: 15 mg via INTRAVENOUS
  Filled 2024-01-20: qty 1

## 2024-01-20 MED ORDER — NICOTINE 21 MG/24HR TD PT24
21.0000 mg | MEDICATED_PATCH | Freq: Every day | TRANSDERMAL | Status: DC
  Administered 2024-01-20 – 2024-01-21 (×2): 21 mg via TRANSDERMAL
  Filled 2024-01-20 (×2): qty 1

## 2024-01-20 MED ORDER — HYDROMORPHONE HCL 1 MG/ML IJ SOLN
0.5000 mg | Freq: Once | INTRAMUSCULAR | Status: AC
  Administered 2024-01-20: 0.5 mg via INTRAVENOUS
  Filled 2024-01-20: qty 0.5

## 2024-01-20 MED ORDER — LORAZEPAM 1 MG PO TABS: 1.0000 mg | ORAL_TABLET | ORAL | Status: DC | PRN

## 2024-01-20 MED ORDER — ONDANSETRON HCL 4 MG PO TABS: 4.0000 mg | ORAL_TABLET | Freq: Four times a day (QID) | ORAL | Status: DC | PRN

## 2024-01-20 MED ORDER — THIAMINE MONONITRATE 100 MG PO TABS
100.0000 mg | ORAL_TABLET | Freq: Every day | ORAL | Status: DC
  Administered 2024-01-20: 100 mg via ORAL
  Filled 2024-01-20 (×2): qty 1

## 2024-01-20 MED ORDER — AMLODIPINE BESYLATE 5 MG PO TABS
5.0000 mg | ORAL_TABLET | Freq: Every day | ORAL | Status: DC
  Administered 2024-01-20 – 2024-01-21 (×2): 5 mg via ORAL
  Filled 2024-01-20 (×2): qty 1

## 2024-01-20 MED ORDER — OXYCODONE HCL 5 MG PO TABS
5.0000 mg | ORAL_TABLET | ORAL | Status: DC | PRN
  Administered 2024-01-20 – 2024-01-21 (×5): 5 mg via ORAL
  Filled 2024-01-20 (×5): qty 1

## 2024-01-20 MED ORDER — ADULT MULTIVITAMIN W/MINERALS CH
1.0000 | ORAL_TABLET | Freq: Every day | ORAL | Status: DC
  Administered 2024-01-20 – 2024-01-21 (×2): 1 via ORAL
  Filled 2024-01-20 (×2): qty 1

## 2024-01-20 MED ORDER — LORAZEPAM 2 MG/ML IJ SOLN
1.0000 mg | INTRAMUSCULAR | Status: DC | PRN
  Administered 2024-01-20: 1 mg via INTRAVENOUS
  Filled 2024-01-20: qty 1

## 2024-01-20 MED ORDER — THIAMINE HCL 100 MG/ML IJ SOLN
100.0000 mg | Freq: Every day | INTRAMUSCULAR | Status: DC
  Administered 2024-01-21: 100 mg via INTRAVENOUS
  Filled 2024-01-20: qty 2

## 2024-01-20 MED ORDER — HYDROMORPHONE HCL 1 MG/ML IJ SOLN
0.5000 mg | INTRAMUSCULAR | Status: DC | PRN
  Administered 2024-01-20 – 2024-01-21 (×9): 0.5 mg via INTRAVENOUS
  Filled 2024-01-20 (×9): qty 0.5

## 2024-01-20 MED ORDER — ACETAMINOPHEN 325 MG PO TABS
650.0000 mg | ORAL_TABLET | Freq: Four times a day (QID) | ORAL | Status: DC | PRN
Start: 2024-01-20 — End: 2024-01-21

## 2024-01-20 MED ORDER — FOLIC ACID 1 MG PO TABS
1.0000 mg | ORAL_TABLET | Freq: Every day | ORAL | Status: DC
  Administered 2024-01-20 – 2024-01-21 (×2): 1 mg via ORAL
  Filled 2024-01-20 (×2): qty 1

## 2024-01-20 MED ORDER — ONDANSETRON HCL 4 MG/2ML IJ SOLN: 4.0000 mg | Freq: Four times a day (QID) | INTRAMUSCULAR | Status: DC | PRN
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
## 2024-01-20 NOTE — Progress Notes (Signed)
  Transition of Care [facility]) Screening Note   Patient Details  Name: [name] [name] Date of Birth: 05-Apr-1980   Transition of Care [facility]) CM/SW Contact:    Hoy DELENA Bigness, LCSW Phone Number: 01/20/2024, 9:28 AM    Transition of Care Department [facility]) has reviewed patient and no TOC needs have been identified at this time. We will continue to monitor patient advancement through interdisciplinary progression rounds. If new patient transition needs arise, please place a TOC consult.    01/20/24 0928  TOC Brief Assessment  Insurance and Status Reviewed  Patient has primary care physician Yes  Home environment has been reviewed Single family home  Prior level of function: Independent  Prior/Current Home Services No current home services  Social Drivers of Health Review SDOH reviewed no interventions necessary  Readmission risk has been reviewed Yes  Transition of care needs no transition of care needs at this time

## 2024-01-20 NOTE — ED Notes (Signed)
 Pt denies drinking when nurse asked about how much alcohol he consumes daily or weekly for CIWA.
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
## 2024-01-20 NOTE — ED Notes (Addendum)
 Pt requesting additional pain meds. No PRN meds available, pt notified of next time pain meds can be given.
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
## 2024-01-20 NOTE — ED Notes (Signed)
 Pt repeatedly asking for pain meds and something to drink. Hospitalist made aware.

## 2024-01-20 NOTE — Progress Notes (Signed)
 Heparin  infusing at 17. No bleeding noted, no shob noted no acute cp pain at this time.

## 2024-01-20 NOTE — Progress Notes (Signed)
 Called to get report,Nurse in with another patient,will call floor when available.SABRA

## 2024-01-20 NOTE — ED Notes (Signed)
 Pt continues to be restless, agitated, asking for pain meds. Pt states he feels anxious and uncomfortable. MD aware. PRN meds given.

## 2024-01-20 NOTE — TOC Progression Note (Signed)
 Transition of Care [facility]) - Progression Note    Patient Details  Name: [name] [name] MRN: [id number] Date of Birth: 09-09-1980  Transition of Care [facility]) CM/SW Contact  Hoy DELENA Bigness, LCSW Phone Number: 01/20/2024, 2:10 PM  Clinical Narrative:    ICM consulted for medication assistance. Pt has active Medicaid listed. Medicaid co-pay for medication is $4. No further medication assistance available. ICM signing off.                     Expected Discharge Plan and Services                                               Social Drivers of Health (SDOH) Interventions SDOH Screenings   Food Insecurity: Food Insecurity Present (01/20/2024)  Housing: Low Risk  (01/20/2024)  Transportation Needs: Unmet Transportation Needs (01/20/2024)  Utilities: Not At Risk (01/20/2024)  Depression (PHQ2-9): Low Risk  (06/05/2022)  Recent Concern: Depression (PHQ2-9) - Medium Risk (05/07/2022)  Social Connections: Moderately Isolated (01/20/2024)  Tobacco Use: High Risk (01/19/2024)    Readmission Risk Interventions     No data to display
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
## 2024-01-21 ENCOUNTER — Telehealth (HOSPITAL_COMMUNITY): Payer: Self-pay | Admitting: Pharmacy Technician

## 2024-01-21 ENCOUNTER — Inpatient Hospital Stay (HOSPITAL_COMMUNITY)

## 2024-01-21 ENCOUNTER — Other Ambulatory Visit (HOSPITAL_COMMUNITY): Payer: Self-pay

## 2024-01-21 DIAGNOSIS — I10 Essential (primary) hypertension: Secondary | ICD-10-CM | POA: Diagnosis not present

## 2024-01-21 DIAGNOSIS — R7989 Other specified abnormal findings of blood chemistry: Secondary | ICD-10-CM | POA: Diagnosis not present

## 2024-01-21 DIAGNOSIS — S02609D Fracture of mandible, unspecified, subsequent encounter for fracture with routine healing: Secondary | ICD-10-CM | POA: Diagnosis not present

## 2024-01-21 DIAGNOSIS — I2699 Other pulmonary embolism without acute cor pulmonale: Secondary | ICD-10-CM | POA: Diagnosis not present

## 2024-01-21 DIAGNOSIS — I2609 Other pulmonary embolism with acute cor pulmonale: Secondary | ICD-10-CM

## 2024-01-21 LAB — ECHOCARDIOGRAM COMPLETE
AR max vel: 3.11 cm2
AV Area VTI: 2.78 cm2
AV Area mean vel: 2.8 cm2
AV Mean grad: 3 mmHg
AV Peak grad: 6.4 mmHg
Ao pk vel: 1.26 m/s
Area-P 1/2: 2.95 cm2
Calc EF: 66.7 %
MV VTI: 2.27 cm2
S' Lateral: 3.2 cm
Single Plane A2C EF: 58.1 %
Single Plane A4C EF: 72.5 %
Weight: 2747.81 [oz_av]

## 2024-01-21 LAB — COMPREHENSIVE METABOLIC PANEL WITH GFR
ALT: 19 U/L (ref 0–44)
AST: 26 U/L (ref 15–41)
Albumin: 3.9 g/dL (ref 3.5–5.0)
Alkaline Phosphatase: 93 U/L (ref 38–126)
Anion gap: 11 (ref 5–15)
BUN: <5 mg/dL — ABNORMAL LOW (ref 6–20)
CO2: 24 mmol/L (ref 22–32)
Calcium: 8.7 mg/dL — ABNORMAL LOW (ref 8.9–10.3)
Chloride: 101 mmol/L (ref 98–111)
Creatinine, Ser: 0.69 mg/dL (ref 0.61–1.24)
GFR, Estimated: >60 mL/min (ref >60)
Glucose, Bld: 92 mg/dL (ref 70–99)
Potassium: 3.3 mmol/L — ABNORMAL LOW (ref 3.5–5.1)
Sodium: 137 mmol/L (ref 135–145)
Total Bilirubin: 0.8 mg/dL (ref 0.0–1.2)
Total Protein: 6.6 g/dL (ref 6.5–8.1)

## 2024-01-21 LAB — HEPARIN LEVEL (UNFRACTIONATED): Heparin Unfractionated: 0.4 [IU]/mL (ref 0.30–0.70)

## 2024-01-21 LAB — MAGNESIUM: Magnesium: 2 mg/dL (ref 1.7–2.4)

## 2024-01-21 LAB — PHOSPHORUS: Phosphorus: 2.2 mg/dL — ABNORMAL LOW (ref 2.5–4.6)

## 2024-01-21 MED ORDER — METHOCARBAMOL 500 MG PO TABS: 500.0000 mg | ORAL_TABLET | Freq: Three times a day (TID) | ORAL | 0 refills | Status: AC | PRN

## 2024-01-21 MED ORDER — FOLIC ACID 1 MG PO TABS: 1.0000 mg | ORAL_TABLET | Freq: Every day | ORAL | 3 refills | Status: AC

## 2024-01-21 MED ORDER — OXYCODONE HCL 5 MG/5ML PO SOLN: 5.0000 mg | Freq: Four times a day (QID) | ORAL | 0 refills | Status: AC | PRN

## 2024-01-21 MED ORDER — VITAMIN B-1 100 MG PO TABS: 100.0000 mg | ORAL_TABLET | Freq: Every day | ORAL | 3 refills | Status: AC

## 2024-01-21 MED ORDER — LOSARTAN POTASSIUM 25 MG PO TABS: 25.0000 mg | ORAL_TABLET | Freq: Every day | ORAL | 3 refills | Status: AC

## 2024-01-21 MED ORDER — ACETAMINOPHEN 500 MG PO TABS: 1000.0000 mg | ORAL_TABLET | Freq: Three times a day (TID) | ORAL | Status: AC | PRN

## 2024-01-21 MED ORDER — NICOTINE 21 MG/24HR TD PT24: 21.0000 mg | MEDICATED_PATCH | Freq: Every day | TRANSDERMAL | 0 refills | Status: AC

## 2024-01-21 MED ORDER — APIXABAN 5 MG PO TABS: ORAL_TABLET | ORAL | 3 refills | Status: AC
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
## 2024-01-21 NOTE — Plan of Care (Signed)
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
## 2024-01-21 NOTE — Telephone Encounter (Signed)
 Patient Product/process development scientist completed.    The patient is insured through [facility].     Ran test claim for Eliquis 5 mg and the current 30 day co-pay is $4.00.   This test claim was processed through [facility]- copay amounts may vary at other pharmacies due to pharmacy/plan contracts, or as the patient moves through the different stages of their insurance plan.     Reyes Sharps, CPHT Pharmacy Technician Patient Advocate Specialist Lead [facility] Patient Advocate Team Direct Number: [phone]  Fax: [phone]

## 2024-05-03 ENCOUNTER — Ambulatory Visit: Payer: Self-pay

## 2024-09-27 ENCOUNTER — Ambulatory Visit: Payer: Self-pay
